# Patient Record
Sex: Female | Born: 1940
Health system: Southern US, Community
[De-identification: ages and names within clinical notes are randomized; demographics above are authoritative.]

## PROBLEM LIST (undated history)

## (undated) DIAGNOSIS — H269 Unspecified cataract: Secondary | ICD-10-CM

## (undated) DIAGNOSIS — E7800 Pure hypercholesterolemia, unspecified: Secondary | ICD-10-CM

## (undated) DIAGNOSIS — I10 Essential (primary) hypertension: Secondary | ICD-10-CM

## (undated) DIAGNOSIS — K635 Polyp of colon: Secondary | ICD-10-CM

## (undated) HISTORY — PX: OTHER SURGICAL HISTORY: SHX169

## (undated) HISTORY — PX: BILATERAL OOPHORECTOMY: SHX1221

## (undated) HISTORY — DX: Unspecified cataract: H26.9

## (undated) HISTORY — PX: COLONOSCOPY: SHX174

## (undated) HISTORY — PX: ABDOMINAL HYSTERECTOMY: SHX81

---

## 1978-08-29 HISTORY — PX: CHOLECYSTECTOMY: SHX55

## 2001-11-26 ENCOUNTER — Encounter: Payer: Self-pay | Admitting: Obstetrics and Gynecology

## 2001-11-26 ENCOUNTER — Ambulatory Visit (HOSPITAL_COMMUNITY): Admission: RE | Admit: 2001-11-26 | Discharge: 2001-11-26 | Payer: Self-pay | Admitting: Obstetrics and Gynecology

## 2002-12-25 ENCOUNTER — Encounter: Payer: Self-pay | Admitting: Obstetrics and Gynecology

## 2002-12-25 ENCOUNTER — Ambulatory Visit (HOSPITAL_COMMUNITY): Admission: RE | Admit: 2002-12-25 | Discharge: 2002-12-25 | Payer: Self-pay | Admitting: Obstetrics and Gynecology

## 2003-03-26 ENCOUNTER — Ambulatory Visit (HOSPITAL_COMMUNITY): Admission: RE | Admit: 2003-03-26 | Discharge: 2003-03-26 | Payer: Self-pay | Admitting: Family Medicine

## 2003-03-26 ENCOUNTER — Encounter: Payer: Self-pay | Admitting: Family Medicine

## 2003-05-15 ENCOUNTER — Ambulatory Visit (HOSPITAL_COMMUNITY): Admission: RE | Admit: 2003-05-15 | Discharge: 2003-05-15 | Payer: Self-pay | Admitting: Internal Medicine

## 2004-01-13 ENCOUNTER — Ambulatory Visit (HOSPITAL_COMMUNITY): Admission: RE | Admit: 2004-01-13 | Discharge: 2004-01-13 | Payer: Self-pay | Admitting: Obstetrics and Gynecology

## 2004-01-20 ENCOUNTER — Ambulatory Visit (HOSPITAL_COMMUNITY): Admission: RE | Admit: 2004-01-20 | Discharge: 2004-01-20 | Payer: Self-pay | Admitting: Family Medicine

## 2005-02-15 ENCOUNTER — Ambulatory Visit (HOSPITAL_COMMUNITY): Admission: RE | Admit: 2005-02-15 | Discharge: 2005-02-15 | Payer: Self-pay | Admitting: Obstetrics and Gynecology

## 2006-03-09 ENCOUNTER — Ambulatory Visit (HOSPITAL_COMMUNITY): Admission: RE | Admit: 2006-03-09 | Discharge: 2006-03-09 | Payer: Self-pay | Admitting: Obstetrics and Gynecology

## 2006-09-11 ENCOUNTER — Ambulatory Visit (HOSPITAL_COMMUNITY): Admission: RE | Admit: 2006-09-11 | Discharge: 2006-09-11 | Payer: Self-pay | Admitting: Family Medicine

## 2007-03-29 ENCOUNTER — Ambulatory Visit (HOSPITAL_COMMUNITY): Admission: RE | Admit: 2007-03-29 | Discharge: 2007-03-29 | Payer: Self-pay | Admitting: Obstetrics and Gynecology

## 2008-04-01 ENCOUNTER — Ambulatory Visit (HOSPITAL_COMMUNITY): Admission: RE | Admit: 2008-04-01 | Discharge: 2008-04-01 | Payer: Self-pay | Admitting: Obstetrics and Gynecology

## 2009-04-15 ENCOUNTER — Ambulatory Visit (HOSPITAL_COMMUNITY): Admission: RE | Admit: 2009-04-15 | Discharge: 2009-04-15 | Payer: Self-pay | Admitting: Obstetrics and Gynecology

## 2010-04-16 ENCOUNTER — Ambulatory Visit (HOSPITAL_COMMUNITY): Admission: RE | Admit: 2010-04-16 | Discharge: 2010-04-16 | Payer: Self-pay | Admitting: Obstetrics and Gynecology

## 2011-01-14 NOTE — Op Note (Signed)
NAMESONDRA, BLIXT                               ACCOUNT NO.:  [id number]   MEDICAL RECORD NO.:  [id number]                    PATIENT TYPE:   LOCATION:                                       FACILITY:   PHYSICIAN:  Lionel December, M.D.                 DATE OF BIRTH:   DATE OF PROCEDURE:  05/15/2003  DATE OF DISCHARGE:                                 OPERATIVE REPORT   PROCEDURE:  Esophagogastroduodenoscopy followed by total colonoscopy.   ENDOSCOPIST:  Lionel December, M.D.   INDICATIONS:  This patient is a 70 year old Caucasian female with recurrent  nausea.  She had upper GI series which revealed 2 filling defects in her  stomach, one in the proximal stomach which looked like a polyp.  The other  one was larger and could be an artifact.  She also had a small sliding  hiatal hernia.  She is, therefore, undergoing diagnostic EGD.  She will also  undergo colonoscopy for screening purposes.  The procedure and risks were  reviewed with the patient and informed consent was obtained.   PREOPERATIVE MEDICATIONS:  Cetacaine spray for oropharyngeal topical  anesthesia, Demerol 50 mg IV and Versed 6 mg IV.   FINDINGS:  Procedures performed in endoscopy suite.  The patient's vital  signs and O2 saturation were monitored during the procedure and remained  stable.   PROCEDURE #1: ESOPHAGOGASTRODUODENOSCOPY:  The patient was placed in the  left lateral recumbent position and Olympus videoscope was passed via the  oropharynx without any difficulty into the esophagus.   ESOPHAGUS:  Mucosa of the esophagus was normal throughout.  The  squamocolumnar junction was unremarkable and a hernia was not noted as it  had spontaneously reduced.   STOMACH:  It was empty and distended very well with insufflation.  The folds  of the proximal stomach were normal.  There was a 4 mm polyp at the gastric  body along the greater curvature which was ablated by a cold biopsy.  There  was antral mucosal granularity  and erythema, but there were no other polyps  noted.  The  pyloric channel was patent.  The angularis, fundus, and cardia  were examined by retroflexing the scope and were normal.   DUODENUM:  Examination of the bulb and second part of the duodenum was  normal.   Endoscope was withdrawn and patient was prepared for procedure #2.   TOTAL COLONOSCOPY:  Rectal examination was performed.  No abnormality noted  on external or digital exam.   The Olympus videoscope was placed in the rectum and advanced under vision  into the sigmoid colon and beyond.  There were a few small scattered  diverticula at the sigmoid colon.  Preparation was satisfactory.  The scope  was passed into the cecum which was identified by ileocecal valve and  appendiceal orifice or stump. Pictures were taken for the  record.  As the  scope was withdrawn, the colon mucosa was carefully examined.  There was a  single small flat polyp of the hepatic flexure which was ablated by a cold  biopsy.  The mucosa of the rest of the colon was normal.  Rectal mucosa  similarly was normal.   The scope was retroflexed to examine anorectal junction. She had small  hemorrhoids below the dentate line. The endoscope was straightened and  withdrawn.  The patient tolerated the procedure well.   FINAL DIAGNOSES:  1. A small gastric polyp at the body of the stomach which was ablated by a     cold biopsy.  2. Small external hemorrhoids.   RECOMMENDATIONS:  1. She will resume her usual medications.  H. pylori serology will be     checked today.  2. High fiber diet, Citrucel 1 tablespoonful daily.  I will be contacting     the patient with results of biopsy and blood test.                                               Lionel December, M.D.    NR/MEDQ  D:  05/15/2003  T:  05/15/2003  Job:  [id number]   cc:   Angus G. Renard Matter, M.D.  [address]  [place]  [place] [address]  Fax: [phone]

## 2011-01-14 NOTE — Consult Note (Signed)
Erin Khan, Erin Khan                             ACCOUNT NO.:  [id number]   MEDICAL RECORD NO.:  [id number]                   PATIENT TYPE:   LOCATION:                                       FACILITY:   PHYSICIAN:  Lionel December, M.D.                 DATE OF BIRTH:  1941-06-07   DATE OF CONSULTATION:  04/28/2003  DATE OF DISCHARGE:                                   CONSULTATION   REQUESTING PHYSICIAN:  Angus G. Renard Matter, M.D.   HISTORY OF PRESENT ILLNESS:  The patient is a 70 year old Caucasian female  who presents today for further evaluation of abnormal upper GI series. She  saw Dr. Renard Matter recently for persistent nausea of one week duration. She had  a upper GI series which revealed two apparent filling defects within the  stomach, one very small within the proximal stomach, questionable polyp, and  a second slightly larger filling defect which could be artifactual. EGD was  recommended. She also had a small hiatal hernia and gastroesophageal reflux.  The patient states that she was given Phenergan p.r.n. nausea which seemed  to help. Her symptoms lasted for approximately one week. She continues to  have intermittent bouts of nausea at this point. Denies any heartburn,  dysphagia, odynophagia, vomiting, constipation, diarrhea, melena, or rectal  bleeding. She does complain of chronic raw or sore feeling in the left mid  to lower abdomen. She has had a couple of abdominal surgeries and has always  felt this might be related to these prior surgeries. She has never had a  colonoscopy.   CURRENT MEDICATIONS:  1. Lotensin 15 mg daily.  2. Lipitor 20 mg daily.  3. Estrace 1 mg daily.  4. Calcium 600 +D daily.  5. Emetrol p.r.n.  6. Phenergan p.r.n.   ALLERGIES:  ASPIRIN.   PAST MEDICAL HISTORY:  1. Hypertension.  2. Hypercholesterolemia.   PAST SURGICAL HISTORY:  1. She had a partial hysterectomy followed by bilateral oophorectomy.  2. She has had a brain shunt for  hydrocephalus in 1998.  3. Cholecystectomy in 1978.  4. In 1999, she had some type of colon surgery; she states her colon     collapsed. She denies having any intestinal resection. Surgery was by Dr.     Lovell Sheehan. At that time, she presented with what sounds like obstructive     type process.   FAMILY HISTORY:  Father died of MI. No family history of colorectal cancer  or chronic GI illnesses.   SOCIAL HISTORY:  She has been married for 27 years, has two children. She is  employed with [employer]. She has never been smoker. Denies any  alcohol use.   REVIEW OF SYSTEMS:  Please HPI for GI. GENERAL:  Denies any weight loss.  CARDIOPULMONARY:  Denies any chest pain or shortness of breath.   PHYSICAL EXAMINATION:  VITAL SIGNS:  Weight  141, height 5 foot 6,  temperature 97.9, blood pressure 114/70, pulse 90.  GENERAL:  Pleasant, well-nourished, well-developed, Caucasian female in no  acute distress.  SKIN:  Warm and dry; no jaundice.  HEENT:  Conjunctive are pink. Sclerae are nonicteric. Oropharyngeal mucosa  moist and pink. No lesions, erythema, or exudate.  NECK:  No lymphadenopathy or thyromegaly.  CHEST:  Lungs clear to auscultation.  CARDIAC:  Reveals regular rate and rhythm. Normal S1 and  S2. No murmurs,  rubs, or gallops.  ABDOMEN:  Positive bowel sounds. Soft, nontender, nondistended. She has mild  left mid to left lower quadrant abdominal pain next to her midline incision.  No organomegaly or masses. No rebound, tenderness, or guarding.  EXTREMITIES:  No edema.   IMPRESSION:  1. History of protracted nausea which is now become more intermittent.     Recent upper GI series revealed two filling defects on x-ray. EKG has     been recommended to further evaluate these filling defects.  2. Chronic left lower quadrant abdominal pain. She really describes this as     a soreness more than anything. May be related to some adhesions although     cannot rule out intraluminal  abnormality. She has never had a colonoscopy     and recommend one at this point, primarily for screening purposes.   PLAN:  1. Colonoscopy and EGD in the near future.  2. Further recommendations to follow.     Tana Coast, P.A.                        Lionel December, M.D.    LL/MEDQ  D:  04/28/2003  T:  04/28/2003  Job:  [id number]   cc:   Angus G. Renard Matter, M.D.  [address]  [place]  [place] [address]  Fax: [phone]

## 2011-04-21 ENCOUNTER — Other Ambulatory Visit (HOSPITAL_COMMUNITY): Payer: Self-pay | Admitting: Obstetrics and Gynecology

## 2011-04-21 DIAGNOSIS — Z139 Encounter for screening, unspecified: Secondary | ICD-10-CM

## 2011-04-26 ENCOUNTER — Ambulatory Visit (HOSPITAL_COMMUNITY)
Admission: RE | Admit: 2011-04-26 | Discharge: 2011-04-26 | Disposition: A | Discharge status: Home / Self Care | Payer: Medicare Other | Source: Ambulatory Visit | Attending: Obstetrics and Gynecology | Admitting: Obstetrics and Gynecology

## 2011-04-26 DIAGNOSIS — Z1231 Encounter for screening mammogram for malignant neoplasm of breast: Secondary | ICD-10-CM | POA: Insufficient documentation

## 2011-04-26 DIAGNOSIS — Z139 Encounter for screening, unspecified: Secondary | ICD-10-CM

## 2012-05-01 ENCOUNTER — Other Ambulatory Visit (HOSPITAL_COMMUNITY): Payer: Self-pay | Admitting: Family Medicine

## 2012-05-01 DIAGNOSIS — Z139 Encounter for screening, unspecified: Secondary | ICD-10-CM

## 2012-05-08 ENCOUNTER — Ambulatory Visit (HOSPITAL_COMMUNITY): Payer: Medicare Other

## 2012-05-11 ENCOUNTER — Ambulatory Visit (HOSPITAL_COMMUNITY): Payer: Medicare Other

## 2012-05-22 ENCOUNTER — Ambulatory Visit (HOSPITAL_COMMUNITY)
Admission: RE | Admit: 2012-05-22 | Discharge: 2012-05-22 | Disposition: A | Discharge status: Home / Self Care | Payer: Medicare Other | Source: Ambulatory Visit | Attending: Family Medicine | Admitting: Family Medicine

## 2012-05-22 DIAGNOSIS — Z139 Encounter for screening, unspecified: Secondary | ICD-10-CM

## 2012-05-22 DIAGNOSIS — Z1231 Encounter for screening mammogram for malignant neoplasm of breast: Secondary | ICD-10-CM | POA: Insufficient documentation

## 2012-07-11 ENCOUNTER — Other Ambulatory Visit (HOSPITAL_COMMUNITY): Payer: Self-pay | Admitting: Family Medicine

## 2012-07-11 ENCOUNTER — Ambulatory Visit (HOSPITAL_COMMUNITY)
Admission: RE | Admit: 2012-07-11 | Discharge: 2012-07-11 | Disposition: A | Discharge status: Home / Self Care | Payer: Medicare Other | Source: Ambulatory Visit | Attending: Family Medicine | Admitting: Family Medicine

## 2012-07-11 DIAGNOSIS — T1490XA Injury, unspecified, initial encounter: Secondary | ICD-10-CM

## 2012-07-11 DIAGNOSIS — M79609 Pain in unspecified limb: Secondary | ICD-10-CM | POA: Insufficient documentation

## 2012-07-11 DIAGNOSIS — X58XXXA Exposure to other specified factors, initial encounter: Secondary | ICD-10-CM | POA: Insufficient documentation

## 2012-08-29 HISTORY — PX: CATARACT EXTRACTION, BILATERAL: SHX1313

## 2013-02-28 ENCOUNTER — Encounter (INDEPENDENT_AMBULATORY_CARE_PROVIDER_SITE_OTHER): Payer: Self-pay | Admitting: *Deleted

## 2013-03-28 ENCOUNTER — Encounter (INDEPENDENT_AMBULATORY_CARE_PROVIDER_SITE_OTHER): Payer: Self-pay | Admitting: *Deleted

## 2013-03-28 ENCOUNTER — Other Ambulatory Visit (INDEPENDENT_AMBULATORY_CARE_PROVIDER_SITE_OTHER): Payer: Self-pay | Admitting: *Deleted

## 2013-03-28 ENCOUNTER — Telehealth (INDEPENDENT_AMBULATORY_CARE_PROVIDER_SITE_OTHER): Payer: Self-pay | Admitting: *Deleted

## 2013-03-28 DIAGNOSIS — Z1211 Encounter for screening for malignant neoplasm of colon: Secondary | ICD-10-CM

## 2013-03-28 NOTE — Telephone Encounter (Signed)
Patient needs movi prep 

## 2013-03-29 MED ORDER — PEG-KCL-NACL-NASULF-NA ASC-C 100 G PO SOLR: 1.0000 | Freq: Once | ORAL | Status: DC

## 2013-04-02 ENCOUNTER — Encounter (INDEPENDENT_AMBULATORY_CARE_PROVIDER_SITE_OTHER): Payer: Self-pay | Admitting: *Deleted

## 2013-04-17 ENCOUNTER — Telehealth (INDEPENDENT_AMBULATORY_CARE_PROVIDER_SITE_OTHER): Payer: Self-pay | Admitting: *Deleted

## 2013-04-17 NOTE — Telephone Encounter (Signed)
  Procedure: tcs  Reason/Indication:  screening  Has patient had this procedure before?  Yes, over 10 yrs ago  If so, when, by whom and where?    Is there a family history of colon cancer?  no  Who?  What age when diagnosed?    Is patient diabetic?   no      Does patient have prosthetic heart valve?  no  Do you have a pacemaker?  no  Has patient ever had endocarditis? no  Has patient had joint replacement within last 12 months?  no  Is patient on Coumadin, Plavix and/or Aspirin? no  Medications: lotinsin 20 mg daily, crestor 20 mg daily, estrace 1 mg daily, calcium daily  Allergies: asa  Medication Adjustment:   Procedure date & time: 05/15/13 at 930

## 2013-04-18 ENCOUNTER — Other Ambulatory Visit (HOSPITAL_COMMUNITY): Payer: Self-pay | Admitting: Family Medicine

## 2013-04-18 DIAGNOSIS — Z139 Encounter for screening, unspecified: Secondary | ICD-10-CM

## 2013-04-18 NOTE — Telephone Encounter (Signed)
agree

## 2013-04-30 ENCOUNTER — Encounter (HOSPITAL_COMMUNITY): Payer: Self-pay | Admitting: Pharmacy Technician

## 2013-05-15 ENCOUNTER — Ambulatory Visit (HOSPITAL_COMMUNITY)
Admission: RE | Admit: 2013-05-15 | Discharge: 2013-05-15 | Disposition: A | Discharge status: Home / Self Care | Payer: Medicare Other | Source: Ambulatory Visit | Attending: Internal Medicine | Admitting: Internal Medicine

## 2013-05-15 ENCOUNTER — Encounter (HOSPITAL_COMMUNITY): Payer: Self-pay | Admitting: *Deleted

## 2013-05-15 ENCOUNTER — Encounter (HOSPITAL_COMMUNITY)
Admission: RE | Disposition: A | Discharge status: Home / Self Care | Payer: Self-pay | Source: Ambulatory Visit | Attending: Internal Medicine

## 2013-05-15 DIAGNOSIS — Z1211 Encounter for screening for malignant neoplasm of colon: Secondary | ICD-10-CM | POA: Insufficient documentation

## 2013-05-15 DIAGNOSIS — K5730 Diverticulosis of large intestine without perforation or abscess without bleeding: Secondary | ICD-10-CM | POA: Insufficient documentation

## 2013-05-15 DIAGNOSIS — D126 Benign neoplasm of colon, unspecified: Secondary | ICD-10-CM

## 2013-05-15 DIAGNOSIS — K644 Residual hemorrhoidal skin tags: Secondary | ICD-10-CM | POA: Insufficient documentation

## 2013-05-15 DIAGNOSIS — I10 Essential (primary) hypertension: Secondary | ICD-10-CM | POA: Insufficient documentation

## 2013-05-15 HISTORY — DX: Pure hypercholesterolemia, unspecified: E78.00

## 2013-05-15 HISTORY — PX: COLONOSCOPY: SHX5424

## 2013-05-15 HISTORY — DX: Polyp of colon: K63.5

## 2013-05-15 HISTORY — DX: Essential (primary) hypertension: I10

## 2013-05-15 SURGERY — COLONOSCOPY
Anesthesia: Moderate Sedation

## 2013-05-15 MED ORDER — MEPERIDINE HCL 50 MG/ML IJ SOLN
INTRAMUSCULAR | Status: AC
  Filled 2013-05-15: qty 1

## 2013-05-15 MED ORDER — STERILE WATER FOR IRRIGATION IR SOLN
Status: DC | PRN
  Administered 2013-05-15: 10:00:00

## 2013-05-15 MED ORDER — MEPERIDINE HCL 50 MG/ML IJ SOLN
INTRAMUSCULAR | Status: DC | PRN
  Administered 2013-05-15 (×2): 25 mg via INTRAVENOUS

## 2013-05-15 MED ORDER — SODIUM CHLORIDE 0.9 % IV SOLN
INTRAVENOUS | Status: DC
  Administered 2013-05-15: 09:00:00 via INTRAVENOUS

## 2013-05-15 MED ORDER — MIDAZOLAM HCL 5 MG/5ML IJ SOLN
INTRAMUSCULAR | Status: AC
  Filled 2013-05-15: qty 10

## 2013-05-15 MED ORDER — MIDAZOLAM HCL 5 MG/5ML IJ SOLN
INTRAMUSCULAR | Status: DC | PRN
  Administered 2013-05-15: 2 mg via INTRAVENOUS
  Administered 2013-05-15 (×3): 1 mg via INTRAVENOUS
  Administered 2013-05-15: 2 mg via INTRAVENOUS

## 2013-05-15 NOTE — Op Note (Signed)
COLONOSCOPY PROCEDURE REPORT  PATIENT:  Erin Khan  MR#:  [id number] Birthdate:  1940/09/03, 72 y.o., female Endoscopist:  Dr. Malissa Hippo, MD Referred By:  Dr. Perlie Mayo. Renard Matter, MD  Procedure Date: 05/15/2013  Procedure:   Colonoscopy  Indications: Patient is 72 year old Caucasian female is undergoing average risk screening colonoscopy.  Informed Consent:  The procedure and risks were reviewed with the patient and informed consent was obtained.  Medications:  Demerol 50 mg IV Versed 7 mg IV  Description of procedure:  After a digital rectal exam was performed, that colonoscope was advanced from the anus through the rectum and colon to the area of the cecum, ileocecal valve and appendiceal orifice. The cecum was deeply intubated. These structures were well-seen and photographed for the record. From the level of the cecum and ileocecal valve, the scope was slowly and cautiously withdrawn. The mucosal surfaces were carefully surveyed utilizing scope tip to flexion to facilitate fold flattening as needed. The scope was pulled down into the rectum where a thorough exam including retroflexion was performed.  Findings:   Prep satisfactory. Two small polyps ablated via cold biopsy and submitted together. These are located at cecum and splenic flexure. Moderate number of diverticula at sigmoid colon. Normal rectal mucosa. Small hemorrhoids below the dentate line along with anal papillae.   Therapeutic/Diagnostic Maneuvers Performed:  See above  Complications:  None  Cecal Withdrawal Time:  18 minutes  Impression:  Examination performed to cecum. Two small polyps ablated via cold biopsy and submitted together(cecum and splenic flexure). Moderate sigmoid colon diverticulosis. Small external hemorrhoids and anal papillae.  Recommendations:  Standard instructions given. I will contact patient with biopsy results and further recommendations.  REHMAN,NAJEEB U  05/15/2013 10:25  AM  CC: Dr. Alice Reichert, MD & Dr. Bonnetta Barry ref. provider found

## 2013-05-15 NOTE — H&P (Signed)
Erin Khan is an 72 y.o. female.   Chief Complaint: Patient's here for colonoscopy. HPI: Patient is 72 year old Caucasian female who presents for  screening colonoscopy. Patient's last exam was in 2004. She denies abdominal pain rectal bleeding or change in bowel habits. Family history is negative for colorectal carcinoma.  Past Medical History  Diagnosis Date  . Hypertension   . Hypercholesteremia   . Colon polyps     Past Surgical History  Procedure Laterality Date  . Abdominal hysterectomy    . Cranial shunt      17 years ago  . Colonoscopy      Family History  Problem Relation Age of Onset  . Lung cancer Sister   . Colon cancer Neg Hx    Social History:  reports that she has never smoked. She does not have any smokeless tobacco history on file. She reports that she does not drink alcohol or use illicit drugs.  Allergies:  Allergies  Allergen Reactions  . Asa [Aspirin] Hives and Swelling    Medications Prior to Admission  Medication Sig Dispense Refill  . benazepril (LOTENSIN) 20 MG tablet Take 20 mg by mouth daily.      . calcium carbonate (OS-CAL) 600 MG TABS tablet Take 600 mg by mouth daily.      Marland Kitchen estradiol (ESTRACE) 1 MG tablet Take 1 mg by mouth daily.      . rosuvastatin (CRESTOR) 20 MG tablet Take 20 mg by mouth daily.        No results found for this or any previous visit (from the past 48 hour(s)). No results found.  ROS  Blood pressure 171/88, pulse 80, temperature 97.9 F (36.6 C), temperature source Oral, resp. rate 18, height 5\' 6"  (1.676 m), weight 144 lb (65.318 kg), SpO2 94.00%. Physical Exam  Constitutional: She appears well-developed and well-nourished.  HENT:  Mouth/Throat: Oropharynx is clear and moist.  Eyes: Conjunctivae are normal. No scleral icterus.  Neck: No thyromegaly present.  Cardiovascular: Normal rate, regular rhythm and normal heart sounds.   No murmur heard. Respiratory: Effort normal and breath sounds normal.  GI: Soft.  She exhibits no distension and no mass. There is no tenderness.  Musculoskeletal: She exhibits no edema.  Lymphadenopathy:    She has no cervical adenopathy.  Neurological: She is alert.  Skin: Skin is warm and dry.     Assessment/Plan Average risk screening colonoscopy.  REHMAN,NAJEEB U 05/15/2013, 9:41 AM

## 2013-05-17 ENCOUNTER — Encounter (HOSPITAL_COMMUNITY): Payer: Self-pay | Admitting: Internal Medicine

## 2013-05-24 ENCOUNTER — Ambulatory Visit (HOSPITAL_COMMUNITY)
Admission: RE | Admit: 2013-05-24 | Discharge: 2013-05-24 | Disposition: A | Discharge status: Home / Self Care | Payer: Medicare Other | Source: Ambulatory Visit | Attending: Family Medicine | Admitting: Family Medicine

## 2013-05-24 DIAGNOSIS — Z139 Encounter for screening, unspecified: Secondary | ICD-10-CM

## 2013-05-24 DIAGNOSIS — Z1231 Encounter for screening mammogram for malignant neoplasm of breast: Secondary | ICD-10-CM | POA: Insufficient documentation

## 2013-05-28 ENCOUNTER — Encounter (INDEPENDENT_AMBULATORY_CARE_PROVIDER_SITE_OTHER): Payer: Self-pay | Admitting: *Deleted

## 2014-04-21 ENCOUNTER — Other Ambulatory Visit (HOSPITAL_COMMUNITY): Payer: Self-pay | Admitting: Family Medicine

## 2014-04-21 DIAGNOSIS — Z1231 Encounter for screening mammogram for malignant neoplasm of breast: Secondary | ICD-10-CM

## 2014-05-26 ENCOUNTER — Ambulatory Visit (HOSPITAL_COMMUNITY)
Admission: RE | Admit: 2014-05-26 | Discharge: 2014-05-26 | Disposition: A | Discharge status: Home / Self Care | Payer: Medicare Other | Source: Ambulatory Visit | Attending: Family Medicine | Admitting: Family Medicine

## 2014-05-26 DIAGNOSIS — Z1231 Encounter for screening mammogram for malignant neoplasm of breast: Secondary | ICD-10-CM | POA: Diagnosis present

## 2015-05-05 ENCOUNTER — Other Ambulatory Visit (HOSPITAL_COMMUNITY): Payer: Self-pay | Admitting: Obstetrics and Gynecology

## 2015-05-05 DIAGNOSIS — Z1231 Encounter for screening mammogram for malignant neoplasm of breast: Secondary | ICD-10-CM

## 2015-05-29 ENCOUNTER — Ambulatory Visit (HOSPITAL_COMMUNITY)
Admission: RE | Admit: 2015-05-29 | Discharge: 2015-05-29 | Disposition: A | Discharge status: Home / Self Care | Payer: Medicare Other | Source: Ambulatory Visit | Attending: Obstetrics and Gynecology | Admitting: Obstetrics and Gynecology

## 2015-05-29 DIAGNOSIS — Z1231 Encounter for screening mammogram for malignant neoplasm of breast: Secondary | ICD-10-CM | POA: Diagnosis not present

## 2015-09-02 DIAGNOSIS — M9901 Segmental and somatic dysfunction of cervical region: Secondary | ICD-10-CM | POA: Diagnosis not present

## 2015-09-02 DIAGNOSIS — M9903 Segmental and somatic dysfunction of lumbar region: Secondary | ICD-10-CM | POA: Diagnosis not present

## 2015-09-02 DIAGNOSIS — M5137 Other intervertebral disc degeneration, lumbosacral region: Secondary | ICD-10-CM | POA: Diagnosis not present

## 2015-09-02 DIAGNOSIS — M9902 Segmental and somatic dysfunction of thoracic region: Secondary | ICD-10-CM | POA: Diagnosis not present

## 2015-09-03 DIAGNOSIS — M5137 Other intervertebral disc degeneration, lumbosacral region: Secondary | ICD-10-CM | POA: Diagnosis not present

## 2015-09-03 DIAGNOSIS — M9902 Segmental and somatic dysfunction of thoracic region: Secondary | ICD-10-CM | POA: Diagnosis not present

## 2015-09-03 DIAGNOSIS — M9903 Segmental and somatic dysfunction of lumbar region: Secondary | ICD-10-CM | POA: Diagnosis not present

## 2015-09-03 DIAGNOSIS — M9901 Segmental and somatic dysfunction of cervical region: Secondary | ICD-10-CM | POA: Diagnosis not present

## 2015-09-08 DIAGNOSIS — N816 Rectocele: Secondary | ICD-10-CM | POA: Diagnosis not present

## 2015-09-08 DIAGNOSIS — I10 Essential (primary) hypertension: Secondary | ICD-10-CM | POA: Diagnosis not present

## 2015-09-09 DIAGNOSIS — M9901 Segmental and somatic dysfunction of cervical region: Secondary | ICD-10-CM | POA: Diagnosis not present

## 2015-09-09 DIAGNOSIS — M5137 Other intervertebral disc degeneration, lumbosacral region: Secondary | ICD-10-CM | POA: Diagnosis not present

## 2015-09-09 DIAGNOSIS — M9903 Segmental and somatic dysfunction of lumbar region: Secondary | ICD-10-CM | POA: Diagnosis not present

## 2015-09-09 DIAGNOSIS — M9902 Segmental and somatic dysfunction of thoracic region: Secondary | ICD-10-CM | POA: Diagnosis not present

## 2015-09-14 DIAGNOSIS — M9903 Segmental and somatic dysfunction of lumbar region: Secondary | ICD-10-CM | POA: Diagnosis not present

## 2015-09-14 DIAGNOSIS — M9902 Segmental and somatic dysfunction of thoracic region: Secondary | ICD-10-CM | POA: Diagnosis not present

## 2015-09-14 DIAGNOSIS — M9901 Segmental and somatic dysfunction of cervical region: Secondary | ICD-10-CM | POA: Diagnosis not present

## 2015-09-14 DIAGNOSIS — M5137 Other intervertebral disc degeneration, lumbosacral region: Secondary | ICD-10-CM | POA: Diagnosis not present

## 2015-09-28 DIAGNOSIS — M9903 Segmental and somatic dysfunction of lumbar region: Secondary | ICD-10-CM | POA: Diagnosis not present

## 2015-09-28 DIAGNOSIS — M9902 Segmental and somatic dysfunction of thoracic region: Secondary | ICD-10-CM | POA: Diagnosis not present

## 2015-09-28 DIAGNOSIS — M9901 Segmental and somatic dysfunction of cervical region: Secondary | ICD-10-CM | POA: Diagnosis not present

## 2015-09-28 DIAGNOSIS — M5137 Other intervertebral disc degeneration, lumbosacral region: Secondary | ICD-10-CM | POA: Diagnosis not present

## 2015-10-14 DIAGNOSIS — N819 Female genital prolapse, unspecified: Secondary | ICD-10-CM | POA: Diagnosis not present

## 2015-10-14 DIAGNOSIS — Z960 Presence of urogenital implants: Secondary | ICD-10-CM | POA: Insufficient documentation

## 2015-10-14 DIAGNOSIS — N8189 Other female genital prolapse: Secondary | ICD-10-CM | POA: Diagnosis not present

## 2015-10-19 DIAGNOSIS — M9902 Segmental and somatic dysfunction of thoracic region: Secondary | ICD-10-CM | POA: Diagnosis not present

## 2015-10-19 DIAGNOSIS — M9903 Segmental and somatic dysfunction of lumbar region: Secondary | ICD-10-CM | POA: Diagnosis not present

## 2015-10-19 DIAGNOSIS — M5137 Other intervertebral disc degeneration, lumbosacral region: Secondary | ICD-10-CM | POA: Diagnosis not present

## 2015-10-19 DIAGNOSIS — M9901 Segmental and somatic dysfunction of cervical region: Secondary | ICD-10-CM | POA: Diagnosis not present

## 2015-10-27 DIAGNOSIS — N8189 Other female genital prolapse: Secondary | ICD-10-CM | POA: Diagnosis not present

## 2015-10-27 DIAGNOSIS — Z960 Presence of urogenital implants: Secondary | ICD-10-CM | POA: Diagnosis not present

## 2015-11-04 DIAGNOSIS — Z0000 Encounter for general adult medical examination without abnormal findings: Secondary | ICD-10-CM | POA: Diagnosis not present

## 2015-11-04 DIAGNOSIS — E782 Mixed hyperlipidemia: Secondary | ICD-10-CM | POA: Diagnosis not present

## 2015-11-14 DIAGNOSIS — I10 Essential (primary) hypertension: Secondary | ICD-10-CM | POA: Diagnosis not present

## 2015-11-14 DIAGNOSIS — E782 Mixed hyperlipidemia: Secondary | ICD-10-CM | POA: Diagnosis not present

## 2015-11-16 DIAGNOSIS — M9903 Segmental and somatic dysfunction of lumbar region: Secondary | ICD-10-CM | POA: Diagnosis not present

## 2015-11-16 DIAGNOSIS — M5137 Other intervertebral disc degeneration, lumbosacral region: Secondary | ICD-10-CM | POA: Diagnosis not present

## 2015-11-16 DIAGNOSIS — M9902 Segmental and somatic dysfunction of thoracic region: Secondary | ICD-10-CM | POA: Diagnosis not present

## 2015-11-16 DIAGNOSIS — M9901 Segmental and somatic dysfunction of cervical region: Secondary | ICD-10-CM | POA: Diagnosis not present

## 2015-12-02 DIAGNOSIS — I739 Peripheral vascular disease, unspecified: Secondary | ICD-10-CM | POA: Diagnosis not present

## 2015-12-02 DIAGNOSIS — I10 Essential (primary) hypertension: Secondary | ICD-10-CM | POA: Diagnosis not present

## 2015-12-09 DIAGNOSIS — I10 Essential (primary) hypertension: Secondary | ICD-10-CM | POA: Diagnosis not present

## 2015-12-09 DIAGNOSIS — E782 Mixed hyperlipidemia: Secondary | ICD-10-CM | POA: Diagnosis not present

## 2015-12-14 DIAGNOSIS — M9901 Segmental and somatic dysfunction of cervical region: Secondary | ICD-10-CM | POA: Diagnosis not present

## 2015-12-14 DIAGNOSIS — M9903 Segmental and somatic dysfunction of lumbar region: Secondary | ICD-10-CM | POA: Diagnosis not present

## 2015-12-14 DIAGNOSIS — M5137 Other intervertebral disc degeneration, lumbosacral region: Secondary | ICD-10-CM | POA: Diagnosis not present

## 2015-12-14 DIAGNOSIS — M9902 Segmental and somatic dysfunction of thoracic region: Secondary | ICD-10-CM | POA: Diagnosis not present

## 2016-01-11 DIAGNOSIS — M9902 Segmental and somatic dysfunction of thoracic region: Secondary | ICD-10-CM | POA: Diagnosis not present

## 2016-01-11 DIAGNOSIS — M5137 Other intervertebral disc degeneration, lumbosacral region: Secondary | ICD-10-CM | POA: Diagnosis not present

## 2016-01-11 DIAGNOSIS — M9901 Segmental and somatic dysfunction of cervical region: Secondary | ICD-10-CM | POA: Diagnosis not present

## 2016-01-11 DIAGNOSIS — M9903 Segmental and somatic dysfunction of lumbar region: Secondary | ICD-10-CM | POA: Diagnosis not present

## 2016-01-26 DIAGNOSIS — N8189 Other female genital prolapse: Secondary | ICD-10-CM | POA: Diagnosis not present

## 2016-01-26 DIAGNOSIS — Z960 Presence of urogenital implants: Secondary | ICD-10-CM | POA: Diagnosis not present

## 2016-02-22 DIAGNOSIS — M9901 Segmental and somatic dysfunction of cervical region: Secondary | ICD-10-CM | POA: Diagnosis not present

## 2016-02-22 DIAGNOSIS — M5137 Other intervertebral disc degeneration, lumbosacral region: Secondary | ICD-10-CM | POA: Diagnosis not present

## 2016-02-22 DIAGNOSIS — M9903 Segmental and somatic dysfunction of lumbar region: Secondary | ICD-10-CM | POA: Diagnosis not present

## 2016-02-22 DIAGNOSIS — M9902 Segmental and somatic dysfunction of thoracic region: Secondary | ICD-10-CM | POA: Diagnosis not present

## 2016-03-21 ENCOUNTER — Ambulatory Visit (INDEPENDENT_AMBULATORY_CARE_PROVIDER_SITE_OTHER): Payer: Medicare Other | Admitting: Family Medicine

## 2016-03-21 ENCOUNTER — Encounter: Payer: Self-pay | Admitting: Family Medicine

## 2016-03-21 DIAGNOSIS — I10 Essential (primary) hypertension: Secondary | ICD-10-CM | POA: Insufficient documentation

## 2016-03-21 DIAGNOSIS — E785 Hyperlipidemia, unspecified: Secondary | ICD-10-CM | POA: Insufficient documentation

## 2016-03-21 MED ORDER — LISINOPRIL-HYDROCHLOROTHIAZIDE 20-25 MG PO TABS: 1.0000 | ORAL_TABLET | Freq: Every day | ORAL | 3 refills | Status: DC

## 2016-03-21 NOTE — Patient Instructions (Signed)
Great to meet you!  Lets have you come back for fasting labs within 1 week  Come back in 4-6 weeks for blood pressure check and re-checking your labs.   We will call with your results within 1 week of your tests.   It is ok to stop your current blood pressure medicine when you run out, then start lisinopril/HCTZ. This is a combination of 2 medications.

## 2016-03-21 NOTE — Progress Notes (Signed)
   HPI  Patient presents today to establish care.  Patient has straight hypertension and was changed to a different medication when starting with her old physician.  She states the medication is too expensive and would like to change.  Hypertension Good medication compliance No chest pain, dyspnea, palpitations, leg edema. He is recreationally active but no formal exercise.  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   PMH: Smoking status noted Past medical history positive for hypercholesterolemia, hypertension, colon polyps Family history of lung cancer her sister. She's had colonoscopy 2, the last one being in 2014. She's had an abdominal hysterectomy. She is not a smoker ROS: Per HPI  Objective: BP 124/68   Pulse 79   Temp 98.1 F (36.7 C) (Oral)   Ht 5\' 6"  (1.676 m)   Wt 150 lb 9.6 oz (68.3 kg)   BMI 24.31 kg/m  Gen: NAD, alert, cooperative with exam HEENT: NCAT CV: RRR, good S1/S2, no murmur Resp: CTABL, no wheezes, non-labored Abd: SNTND, BS present, no guarding or organomegaly Ext: No edema, warm Neuro: Alert and oriented,  strength 5/5 and sensation intact in all 4 extremities  Assessment and plan:  # Hypertension At goal She is on Edarbyclor, which is a max dose ARB + chlorthalidone Change to prinzide 20/25, 1/2 dose Ace so I increased the HCTZ portion.  Labs, recheck in 4-6 weeks  Hyperlipidemia Rechecking labs   Meds ordered this encounter  Medications  . lisinopril-hydrochlorothiazide (PRINZIDE,ZESTORETIC) 20-25 MG tablet    Sig: Take 1 tablet by mouth daily.    Dispense:  90 tablet    Refill:  [place], MD [facility] 03/21/2016, 1:41 PM

## 2016-03-25 ENCOUNTER — Other Ambulatory Visit: Payer: Medicare Other

## 2016-03-25 DIAGNOSIS — I10 Essential (primary) hypertension: Secondary | ICD-10-CM | POA: Diagnosis not present

## 2016-03-26 LAB — CBC WITH DIFFERENTIAL/PLATELET
Basophils Absolute: 0.1 10*3/uL (ref 0.0–0.2)
Basos: 1 %
EOS (ABSOLUTE): 0.2 10*3/uL (ref 0.0–0.4)
Eos: 3 %
Hematocrit: 37.6 % (ref 34.0–46.6)
Hemoglobin: 12.9 g/dL (ref 11.1–15.9)
Immature Grans (Abs): 0 10*3/uL (ref 0.0–0.1)
Immature Granulocytes: 0 %
Lymphocytes Absolute: 1.4 10*3/uL (ref 0.7–3.1)
Lymphs: 24 %
MCH: 31 pg (ref 26.6–33.0)
MCHC: 34.3 g/dL (ref 31.5–35.7)
MCV: 90 fL (ref 79–97)
Monocytes Absolute: 0.9 10*3/uL (ref 0.1–0.9)
Monocytes: 14 %
Neutrophils Absolute: 3.5 10*3/uL (ref 1.4–7.0)
Neutrophils: 58 %
Platelets: 207 10*3/uL (ref 150–379)
RBC: 4.16 x10E6/uL (ref 3.77–5.28)
RDW: 13.6 % (ref 12.3–15.4)
WBC: 6 10*3/uL (ref 3.4–10.8)

## 2016-03-26 LAB — CMP14+EGFR
ALT: 12 IU/L (ref 0–32)
AST: 11 IU/L (ref 0–40)
Albumin/Globulin Ratio: 1.5 (ref 1.2–2.2)
Albumin: 4.2 g/dL (ref 3.5–4.8)
Alkaline Phosphatase: 90 IU/L (ref 39–117)
BUN/Creatinine Ratio: 20 (ref 12–28)
BUN: 29 mg/dL — ABNORMAL HIGH (ref 8–27)
Bilirubin Total: 0.4 mg/dL (ref 0.0–1.2)
CO2: 25 mmol/L (ref 18–29)
Calcium: 9.6 mg/dL (ref 8.7–10.3)
Chloride: 100 mmol/L (ref 96–106)
Creatinine, Ser: 1.43 mg/dL — ABNORMAL HIGH (ref 0.57–1.00)
GFR calc Af Amer: 42 mL/min/{1.73_m2} — ABNORMAL LOW (ref >59)
GFR calc non Af Amer: 36 mL/min/{1.73_m2} — ABNORMAL LOW (ref >59)
Globulin, Total: 2.8 g/dL (ref 1.5–4.5)
Glucose: 90 mg/dL (ref 65–99)
Potassium: 4 mmol/L (ref 3.5–5.2)
Sodium: 141 mmol/L (ref 134–144)
Total Protein: 7 g/dL (ref 6.0–8.5)

## 2016-03-26 LAB — LIPID PANEL
Chol/HDL Ratio: 5.8 ratio units — ABNORMAL HIGH (ref 0.0–4.4)
Cholesterol, Total: 228 mg/dL — ABNORMAL HIGH (ref 100–199)
HDL: 39 mg/dL — ABNORMAL LOW (ref >39)
Triglycerides: 403 mg/dL — ABNORMAL HIGH (ref 0–149)

## 2016-03-29 DIAGNOSIS — N819 Female genital prolapse, unspecified: Secondary | ICD-10-CM | POA: Diagnosis not present

## 2016-03-29 DIAGNOSIS — N8189 Other female genital prolapse: Secondary | ICD-10-CM | POA: Diagnosis not present

## 2016-03-29 DIAGNOSIS — Z960 Presence of urogenital implants: Secondary | ICD-10-CM | POA: Diagnosis not present

## 2016-04-04 DIAGNOSIS — M5137 Other intervertebral disc degeneration, lumbosacral region: Secondary | ICD-10-CM | POA: Diagnosis not present

## 2016-04-04 DIAGNOSIS — M9901 Segmental and somatic dysfunction of cervical region: Secondary | ICD-10-CM | POA: Diagnosis not present

## 2016-04-04 DIAGNOSIS — M9903 Segmental and somatic dysfunction of lumbar region: Secondary | ICD-10-CM | POA: Diagnosis not present

## 2016-04-04 DIAGNOSIS — M9902 Segmental and somatic dysfunction of thoracic region: Secondary | ICD-10-CM | POA: Diagnosis not present

## 2016-04-18 ENCOUNTER — Ambulatory Visit (INDEPENDENT_AMBULATORY_CARE_PROVIDER_SITE_OTHER): Payer: Medicare Other | Admitting: Family Medicine

## 2016-04-18 ENCOUNTER — Other Ambulatory Visit (HOSPITAL_COMMUNITY): Payer: Self-pay | Admitting: Obstetrics and Gynecology

## 2016-04-18 ENCOUNTER — Encounter: Payer: Self-pay | Admitting: Family Medicine

## 2016-04-18 VITALS — BP 140/78 | HR 91 | Temp 98.0°F | Ht 66.0 in | Wt 151.6 lb

## 2016-04-18 DIAGNOSIS — I10 Essential (primary) hypertension: Secondary | ICD-10-CM

## 2016-04-18 DIAGNOSIS — E785 Hyperlipidemia, unspecified: Secondary | ICD-10-CM | POA: Diagnosis not present

## 2016-04-18 DIAGNOSIS — E781 Pure hyperglyceridemia: Secondary | ICD-10-CM | POA: Diagnosis not present

## 2016-04-18 DIAGNOSIS — Z1231 Encounter for screening mammogram for malignant neoplasm of breast: Secondary | ICD-10-CM

## 2016-04-18 NOTE — Progress Notes (Signed)
   HPI  Patient presents today here for HTN f/u  She is tolerating med well, changed from ARB/chlorthalidone combo to prinzide.   He is tolerating it very well. She denies any shortness of breath, chest pain, leg swelling, or palpitations. She states her blood pressure at home is been ranging [id number] to 0000000 systolic. She's not watching her diet lately, she states that previously in life she was able to lower her cholesterol with diet very effectively. She states that she did not tolerate Lipitor very well. To get eating right again. She walks 15-20 minutes a day, 4-5 days a week.   PMH: Smoking status noted ROS: Per HPI  Objective: BP 140/78   Pulse 91   Temp 98 F (36.7 C) (Oral)   Ht 5\' 6"  (1.676 m)   Wt 151 lb 9.6 oz (68.8 kg)   BMI 24.47 kg/m  Gen: NAD, alert, cooperative with exam HEENT: NCAT CV: RRR, good S1/S2, no murmur Resp: CTABL, no wheezes, non-labored Abd: SNTND, BS present, no guarding or organomegaly Ext: No edema, warm Neuro: Alert and oriented, No gross deficits  Assessment and plan:  # Hypertension Well controlled Continue Prinzide Repeat labs  # Hyperlipidemia Discussed statins, she would like to defer. She is going to try therapeutic lifestyle changes Repeat LDL direct  # Hypertriglyceridemia Discussed that this is mostly diet and exercise dependent She will make lifestyle changes Repeat labs in 3 months    Orders Placed This Encounter  Procedures  . Basic metabolic panel    Order Specific Question:   Has the patient fasted?    Answer:   No  . LDL Cholesterol, Direct    Laroy Apple, MD [facility] 04/18/2016, 2:11 PM

## 2016-04-18 NOTE — Patient Instructions (Signed)
Great to see you!  We will call with you rlabs  Come back in 3 months to check your triglycerides and cholesterol again.   Try to cut back on fried and fatty foods and try to get 20-30 minutes of aerobic exercise 4-5 days a week.

## 2016-04-19 LAB — BASIC METABOLIC PANEL
BUN/Creatinine Ratio: 22 (ref 12–28)
BUN: 31 mg/dL — ABNORMAL HIGH (ref 8–27)
CO2: 20 mmol/L (ref 18–29)
Calcium: 9.9 mg/dL (ref 8.7–10.3)
Chloride: 100 mmol/L (ref 96–106)
Creatinine, Ser: 1.4 mg/dL — ABNORMAL HIGH (ref 0.57–1.00)
GFR calc Af Amer: 43 mL/min/{1.73_m2} — ABNORMAL LOW (ref >59)
GFR calc non Af Amer: 37 mL/min/{1.73_m2} — ABNORMAL LOW (ref >59)
Glucose: 94 mg/dL (ref 65–99)
Potassium: 4 mmol/L (ref 3.5–5.2)
Sodium: 140 mmol/L (ref 134–144)

## 2016-04-19 LAB — LDL CHOLESTEROL, DIRECT: LDL Direct: 111 mg/dL — ABNORMAL HIGH (ref 0–99)

## 2016-05-09 DIAGNOSIS — M9901 Segmental and somatic dysfunction of cervical region: Secondary | ICD-10-CM | POA: Diagnosis not present

## 2016-05-09 DIAGNOSIS — M9903 Segmental and somatic dysfunction of lumbar region: Secondary | ICD-10-CM | POA: Diagnosis not present

## 2016-05-09 DIAGNOSIS — M9902 Segmental and somatic dysfunction of thoracic region: Secondary | ICD-10-CM | POA: Diagnosis not present

## 2016-05-09 DIAGNOSIS — M5137 Other intervertebral disc degeneration, lumbosacral region: Secondary | ICD-10-CM | POA: Diagnosis not present

## 2016-05-28 ENCOUNTER — Ambulatory Visit (INDEPENDENT_AMBULATORY_CARE_PROVIDER_SITE_OTHER): Payer: Medicare Other

## 2016-05-28 DIAGNOSIS — Z23 Encounter for immunization: Secondary | ICD-10-CM | POA: Diagnosis not present

## 2016-05-30 ENCOUNTER — Ambulatory Visit (HOSPITAL_COMMUNITY)
Admission: RE | Admit: 2016-05-30 | Discharge: 2016-05-30 | Disposition: A | Discharge status: Home / Self Care | Payer: Medicare Other | Source: Ambulatory Visit | Attending: Obstetrics and Gynecology | Admitting: Obstetrics and Gynecology

## 2016-05-30 DIAGNOSIS — Z1231 Encounter for screening mammogram for malignant neoplasm of breast: Secondary | ICD-10-CM | POA: Insufficient documentation

## 2016-06-20 DIAGNOSIS — M9901 Segmental and somatic dysfunction of cervical region: Secondary | ICD-10-CM | POA: Diagnosis not present

## 2016-06-20 DIAGNOSIS — M5137 Other intervertebral disc degeneration, lumbosacral region: Secondary | ICD-10-CM | POA: Diagnosis not present

## 2016-06-20 DIAGNOSIS — M9903 Segmental and somatic dysfunction of lumbar region: Secondary | ICD-10-CM | POA: Diagnosis not present

## 2016-06-20 DIAGNOSIS — M9902 Segmental and somatic dysfunction of thoracic region: Secondary | ICD-10-CM | POA: Diagnosis not present

## 2016-07-25 ENCOUNTER — Ambulatory Visit (INDEPENDENT_AMBULATORY_CARE_PROVIDER_SITE_OTHER): Payer: Medicare Other | Admitting: Family Medicine

## 2016-07-25 ENCOUNTER — Encounter: Payer: Self-pay | Admitting: Family Medicine

## 2016-07-25 VITALS — BP 140/77 | HR 73 | Temp 97.1°F | Ht 66.0 in | Wt 152.6 lb

## 2016-07-25 DIAGNOSIS — E781 Pure hyperglyceridemia: Secondary | ICD-10-CM

## 2016-07-25 DIAGNOSIS — Z23 Encounter for immunization: Secondary | ICD-10-CM

## 2016-07-25 DIAGNOSIS — E785 Hyperlipidemia, unspecified: Secondary | ICD-10-CM | POA: Diagnosis not present

## 2016-07-25 DIAGNOSIS — I10 Essential (primary) hypertension: Secondary | ICD-10-CM | POA: Diagnosis not present

## 2016-07-25 NOTE — Patient Instructions (Signed)
Great to see you!  Get Dr. Marvel Plan to send a copy of your dexa scan results when they ae done.   Lets plan to see you again in 3-4 months  If your triglycerides are still elevated we will try a fish oil type medication called vascepa.

## 2016-07-25 NOTE — Progress Notes (Signed)
   HPI  Patient presents today here for follow-up of hyperlipidemia, hypertriglyceridemia, and hypertension.  Hypertension Good medication compliance No headache, chest pain, palpitations, leg edema Patient states that she checks blood pressure once a week and it's usually 130s over 80s  She is fasting, she has been watching her diet a little bit closer lately. She previously did not want to take statins.  She has had her mammogram this year at [facility] She has an appointment with her GYN who will schedule her DEXA scan. She would like a pneumonia shot if it's due  PMH: Smoking status noted ROS: Per HPI  Objective: BP 140/77   Pulse 73   Temp 97.1 F (36.2 C) (Oral)   Ht '5\' 6"'$  (1.676 m)   Wt 152 lb 9.6 oz (69.2 kg)   BMI 24.63 kg/m  Gen: NAD, alert, cooperative with exam HEENT: NCAT CV: RRR, good S1/S2, no murmur Resp: CTABL, no wheezes, non-labored Neuro: Alert and oriented, No gross deficits  Assessment and plan:  # Hypertension Elevated today- improved after visit Normally controlled at home, continue Prinzide at current dose Follow-up as usual, suspect element of white coat hypertension Repeating CMP  # Hypertriglyceridemia Previous elevation above 400 Have been treating with therapeutic lifestyle changes so far, consider vascepa if still elevated  # Hyperlipidemia Mild elevation, considering age does not need statin. labs  Orders Placed This Encounter  Procedures  . CMP14+EGFR  . Lipid panel    Laroy Apple, MD [facility] 07/25/2016, 8:12 AM

## 2016-07-26 ENCOUNTER — Telehealth: Payer: Self-pay | Admitting: Family Medicine

## 2016-07-26 ENCOUNTER — Other Ambulatory Visit: Payer: Self-pay | Admitting: Family Medicine

## 2016-07-26 DIAGNOSIS — E781 Pure hyperglyceridemia: Secondary | ICD-10-CM

## 2016-07-26 LAB — CMP14+EGFR
ALT: 10 IU/L (ref 0–32)
AST: 6 IU/L (ref 0–40)
Albumin/Globulin Ratio: 1.3 (ref 1.2–2.2)
Albumin: 4.1 g/dL (ref 3.5–4.8)
Alkaline Phosphatase: 87 IU/L (ref 39–117)
BUN/Creatinine Ratio: 24 (ref 12–28)
BUN: 31 mg/dL — ABNORMAL HIGH (ref 8–27)
Bilirubin Total: 0.4 mg/dL (ref 0.0–1.2)
CO2: 25 mmol/L (ref 18–29)
Calcium: 9.6 mg/dL (ref 8.7–10.3)
Chloride: 99 mmol/L (ref 96–106)
Creatinine, Ser: 1.27 mg/dL — ABNORMAL HIGH (ref 0.57–1.00)
GFR calc Af Amer: 48 mL/min/{1.73_m2} — ABNORMAL LOW (ref >59)
GFR calc non Af Amer: 42 mL/min/{1.73_m2} — ABNORMAL LOW (ref >59)
Globulin, Total: 3.1 g/dL (ref 1.5–4.5)
Glucose: 96 mg/dL (ref 65–99)
Potassium: 4.4 mmol/L (ref 3.5–5.2)
Sodium: 141 mmol/L (ref 134–144)
Total Protein: 7.2 g/dL (ref 6.0–8.5)

## 2016-07-26 LAB — LIPID PANEL
Chol/HDL Ratio: 6.7 ratio units — ABNORMAL HIGH (ref 0.0–4.4)
Cholesterol, Total: 254 mg/dL — ABNORMAL HIGH (ref 100–199)
HDL: 38 mg/dL — ABNORMAL LOW (ref >39)
Triglycerides: 411 mg/dL — ABNORMAL HIGH (ref 0–149)

## 2016-07-26 MED ORDER — ICOSAPENT ETHYL 1 G PO CAPS: 2.0000 g | ORAL_CAPSULE | Freq: Two times a day (BID) | ORAL | 11 refills | Status: DC

## 2016-07-26 NOTE — Telephone Encounter (Signed)
Ok to take OTC fish oil capsules 2 twice daily ( 4 G total).  Laroy Apple, MD [facility] 07/26/2016, 5:24 PM

## 2016-07-27 NOTE — Telephone Encounter (Signed)
Pt notified of recommendation Verbalizes understanding 

## 2016-08-01 DIAGNOSIS — M9903 Segmental and somatic dysfunction of lumbar region: Secondary | ICD-10-CM | POA: Diagnosis not present

## 2016-08-01 DIAGNOSIS — M5137 Other intervertebral disc degeneration, lumbosacral region: Secondary | ICD-10-CM | POA: Diagnosis not present

## 2016-08-01 DIAGNOSIS — M9901 Segmental and somatic dysfunction of cervical region: Secondary | ICD-10-CM | POA: Diagnosis not present

## 2016-08-01 DIAGNOSIS — M9902 Segmental and somatic dysfunction of thoracic region: Secondary | ICD-10-CM | POA: Diagnosis not present

## 2016-08-08 DIAGNOSIS — Z6824 Body mass index (BMI) 24.0-24.9, adult: Secondary | ICD-10-CM | POA: Diagnosis not present

## 2016-08-08 DIAGNOSIS — N952 Postmenopausal atrophic vaginitis: Secondary | ICD-10-CM | POA: Diagnosis not present

## 2016-08-08 DIAGNOSIS — Z01419 Encounter for gynecological examination (general) (routine) without abnormal findings: Secondary | ICD-10-CM | POA: Diagnosis not present

## 2016-08-08 DIAGNOSIS — R300 Dysuria: Secondary | ICD-10-CM | POA: Diagnosis not present

## 2016-08-08 DIAGNOSIS — Z1389 Encounter for screening for other disorder: Secondary | ICD-10-CM | POA: Diagnosis not present

## 2016-09-12 DIAGNOSIS — M5137 Other intervertebral disc degeneration, lumbosacral region: Secondary | ICD-10-CM | POA: Diagnosis not present

## 2016-09-12 DIAGNOSIS — M9901 Segmental and somatic dysfunction of cervical region: Secondary | ICD-10-CM | POA: Diagnosis not present

## 2016-09-12 DIAGNOSIS — M9902 Segmental and somatic dysfunction of thoracic region: Secondary | ICD-10-CM | POA: Diagnosis not present

## 2016-09-12 DIAGNOSIS — M9903 Segmental and somatic dysfunction of lumbar region: Secondary | ICD-10-CM | POA: Diagnosis not present

## 2016-10-24 DIAGNOSIS — M9903 Segmental and somatic dysfunction of lumbar region: Secondary | ICD-10-CM | POA: Diagnosis not present

## 2016-10-24 DIAGNOSIS — M9901 Segmental and somatic dysfunction of cervical region: Secondary | ICD-10-CM | POA: Diagnosis not present

## 2016-10-24 DIAGNOSIS — M9902 Segmental and somatic dysfunction of thoracic region: Secondary | ICD-10-CM | POA: Diagnosis not present

## 2016-10-24 DIAGNOSIS — M5137 Other intervertebral disc degeneration, lumbosacral region: Secondary | ICD-10-CM | POA: Diagnosis not present

## 2016-10-27 ENCOUNTER — Encounter: Payer: Self-pay | Admitting: Family Medicine

## 2016-10-27 ENCOUNTER — Ambulatory Visit (INDEPENDENT_AMBULATORY_CARE_PROVIDER_SITE_OTHER): Payer: Medicare Other | Admitting: Family Medicine

## 2016-10-27 VITALS — BP 139/86 | HR 75 | Temp 97.0°F | Ht 66.0 in | Wt 155.0 lb

## 2016-10-27 DIAGNOSIS — E781 Pure hyperglyceridemia: Secondary | ICD-10-CM | POA: Diagnosis not present

## 2016-10-27 DIAGNOSIS — I10 Essential (primary) hypertension: Secondary | ICD-10-CM

## 2016-10-27 DIAGNOSIS — E785 Hyperlipidemia, unspecified: Secondary | ICD-10-CM | POA: Diagnosis not present

## 2016-10-27 NOTE — Patient Instructions (Signed)
Great to see you!  Keep track of about 2-3 blod pressures a week until we see you again in 3 months

## 2016-10-27 NOTE — Progress Notes (Signed)
   HPI  Patient presents today to follow-up for Erin Khan, Erin Khan, hypertension.  Erin Khan Patient taking 2 fish will twice daily, could not afford Vascepa Watching her diet.  Hypertension States that blood pressure usually 120s or 130s at [facility] or at other doctor's offices Good medication compliance, no chest pain.  Erin Khan Does not want to take statins. Watch her overnight, [place] as above  PMH: Smoking status noted ROS: Per HPI  Objective: BP (!) 168/89   Pulse 75   Temp 97 F (36.1 C) (Oral)   Ht '5\' 6"'$  (1.676 m)   Wt 155 lb (70.3 kg)   BMI 25.02 kg/m  Gen: NAD, alert, cooperative with exam HEENT: NCAT CV: RRR, good S1/S2, no murmur Resp: CTABL, no wheezes, non-labored Ext: No edema, warm Neuro: Alert and oriented, No gross deficits  Assessment and plan:  # Hypertension Likely whitecoat hypertension with elevation isolated elevation here Blood pressure log Follow-up 3 months, continue Prinzide  # Erin Khan Previously over 400, patient now taking fish oil Recheck today  # HLD Patient not wanting to take statins, LDL was 111 when checked directly a few months ago, considering her age this is not so severe.    Orders Placed This Encounter  Procedures  . Lipid panel  . [place], MD [facility] 10/27/2016, 8:11 AM

## 2016-10-28 LAB — CMP14+EGFR
ALT: 11 IU/L (ref 0–32)
AST: 11 IU/L (ref 0–40)
Albumin/Globulin Ratio: 1.4 (ref 1.2–2.2)
Albumin: 4.2 g/dL (ref 3.5–4.8)
Alkaline Phosphatase: 76 IU/L (ref 39–117)
BUN/Creatinine Ratio: 24 (ref 12–28)
BUN: 32 mg/dL — ABNORMAL HIGH (ref 8–27)
Bilirubin Total: 0.3 mg/dL (ref 0.0–1.2)
CO2: 21 mmol/L (ref 18–29)
Calcium: 9.6 mg/dL (ref 8.7–10.3)
Chloride: 97 mmol/L (ref 96–106)
Creatinine, Ser: 1.34 mg/dL — ABNORMAL HIGH (ref 0.57–1.00)
GFR calc Af Amer: 45 mL/min/{1.73_m2} — ABNORMAL LOW (ref >59)
GFR calc non Af Amer: 39 mL/min/{1.73_m2} — ABNORMAL LOW (ref >59)
Globulin, Total: 3 g/dL (ref 1.5–4.5)
Glucose: 100 mg/dL — ABNORMAL HIGH (ref 65–99)
Potassium: 4.3 mmol/L (ref 3.5–5.2)
Sodium: 143 mmol/L (ref 134–144)
Total Protein: 7.2 g/dL (ref 6.0–8.5)

## 2016-10-28 LAB — LIPID PANEL
Chol/HDL Ratio: 7.1 ratio units — ABNORMAL HIGH (ref 0.0–4.4)
Cholesterol, Total: 268 mg/dL — ABNORMAL HIGH (ref 100–199)
HDL: 38 mg/dL — ABNORMAL LOW (ref >39)
LDL Calculated: 178 mg/dL — ABNORMAL HIGH (ref 0–99)
Triglycerides: 258 mg/dL — ABNORMAL HIGH (ref 0–149)
VLDL Cholesterol Cal: 52 mg/dL — ABNORMAL HIGH (ref 5–40)

## 2016-11-02 ENCOUNTER — Ambulatory Visit: Payer: Medicare Other | Admitting: Pharmacist

## 2016-11-10 DIAGNOSIS — N8189 Other female genital prolapse: Secondary | ICD-10-CM | POA: Diagnosis not present

## 2016-11-10 DIAGNOSIS — Z960 Presence of urogenital implants: Secondary | ICD-10-CM | POA: Diagnosis not present

## 2016-11-10 DIAGNOSIS — N819 Female genital prolapse, unspecified: Secondary | ICD-10-CM | POA: Diagnosis not present

## 2016-11-17 ENCOUNTER — Ambulatory Visit: Payer: Medicare Other | Admitting: Pharmacist

## 2016-12-08 ENCOUNTER — Ambulatory Visit (INDEPENDENT_AMBULATORY_CARE_PROVIDER_SITE_OTHER): Payer: Medicare Other

## 2016-12-08 ENCOUNTER — Ambulatory Visit (INDEPENDENT_AMBULATORY_CARE_PROVIDER_SITE_OTHER): Payer: Medicare Other | Admitting: Pharmacist

## 2016-12-08 ENCOUNTER — Encounter: Payer: Self-pay | Admitting: Pharmacist

## 2016-12-08 VITALS — BP 136/82 | HR 76 | Ht 64.5 in | Wt 151.0 lb

## 2016-12-08 DIAGNOSIS — Z780 Asymptomatic menopausal state: Secondary | ICD-10-CM

## 2016-12-08 DIAGNOSIS — Z982 Presence of cerebrospinal fluid drainage device: Secondary | ICD-10-CM

## 2016-12-08 DIAGNOSIS — M810 Age-related osteoporosis without current pathological fracture: Secondary | ICD-10-CM | POA: Insufficient documentation

## 2016-12-08 DIAGNOSIS — Z0000 Encounter for general adult medical examination without abnormal findings: Secondary | ICD-10-CM

## 2016-12-08 DIAGNOSIS — Z960 Presence of urogenital implants: Secondary | ICD-10-CM

## 2016-12-08 NOTE — Progress Notes (Signed)
Patient ID: Erin Khan, female   DOB: 30-Aug-1940, 76 y.o.   MRN: [id number]     Subjective:   Erin Khan is a 76 y.o. female who presents for an Initial Medicare Annual Wellness Visit.  Erin Khan is married. She has 2 adult children, 8 grandchildren and 5 great grandchildren.  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  She enjoys continuing to work.    She does not have any health related complaints today.   We are checking a DEXA today:  She denies any history of fractures, no family history of hip fractures or osteoporosis.  Max adult height was 5'6" and today she was measured at 5' 4.5" She is taking calcium 600mg  qd but gets little calcium from her diet.  Current Medications (verified) Outpatient Encounter Prescriptions as of 12/08/2016  Medication Sig  . calcium carbonate (OS-CAL) 600 MG TABS tablet Take 600 mg by mouth daily.  Marland Kitchen lisinopril-hydrochlorothiazide (PRINZIDE,ZESTORETIC) 20-25 MG tablet Take 1 tablet by mouth daily.  . Omega-3 Fatty Acids (FISH OIL) 1000 MG CAPS Take 2 capsules by mouth 2 (two) times daily.  . vitamin B-12 (CYANOCOBALAMIN) 500 MCG tablet Take 500 mcg by mouth daily.   No facility-administered encounter medications on file as of 12/08/2016.     Allergies (verified) Asa [aspirin]   History: Past Medical History:  Diagnosis Date  . Cataract   . Colon polyps   . Hypercholesteremia   . Hypertension    Past Surgical History:  Procedure Laterality Date  . ABDOMINAL HYSTERECTOMY    . BILATERAL OOPHORECTOMY    . CATARACT EXTRACTION, BILATERAL  2014  . COLONOSCOPY    . COLONOSCOPY N/A 05/15/2013   Procedure: COLONOSCOPY;  Surgeon: Rogene Houston, MD;  Location: [facility];  Service: Endoscopy;  Laterality: N/A;  930  . cranial shunt     17 years ago   Family History  Problem Relation Age of Onset  . Lung cancer Sister   . Hypertension Father   . Heart attack Brother     . Arthritis Sister   . Colon cancer Neg Hx    Social History   Occupational History  . Not on file.   Social History Main Topics  . Smoking status: Never Smoker  . Smokeless tobacco: Never Used  . Alcohol use No  . Drug use: No  . Sexual activity: Yes    Do you feel safe at home?  Yes Are there smokers in your home (other than you)? No  Dietary issues and exercise activities: Current Exercise Habits: The patient has a physically strenous job, but has no regular exercise apart from work.;Home exercise routine, Type of exercise: walking, Time (Minutes): 30, Frequency (Times/Week): 2, Weekly Exercise (Minutes/Week): 60, Intensity: Moderate  Current Dietary habits:  Trying to limit high fat foods due to elevated cholesterol. Patient refuses statin therapy.   Objective:    Today's Vitals   12/08/16 1406  BP: 136/82  Pulse: 76  Weight: 151 lb (68.5 kg)  Height: 5' 4.5" (1.638 m)  PainSc: 0-No pain   Body mass index is 25.52 kg/m.   DEXA Results:  T Score for Spine L1-L3 = -2.0 T-Score for total Left Hip = -2.7  Activities of Daily Living In your present state of health, do you have any difficulty performing the following activities: 12/08/2016  Hearing? N  Vision? N  Difficulty concentrating or making decisions?  N  Walking or climbing stairs? N  Dressing or bathing? N  Doing errands, shopping? N  Preparing Food and eating ? N  Using the Toilet? N  In the past six months, have you accidently leaked urine? N  Do you have problems with loss of bowel control? N  Managing your Medications? N  Managing your Finances? N  Housekeeping or managing your Housekeeping? N  Some recent data might be hidden     Cardiac Risk Factors include: advanced age (>64men, >94 women);dyslipidemia;hypertension  Depression Screen PHQ 2/9 Scores 12/08/2016 10/27/2016 07/25/2016 04/18/2016  PHQ - 2 Score 0 0 0 0     Fall Risk Fall Risk  12/08/2016 10/27/2016 07/25/2016 04/18/2016 03/21/2016   Falls in the past year? No No No No No    Cognitive Function: MMSE - Mini Mental State Exam 12/08/2016  Orientation to time 5  Orientation to Place 5  Registration 3  Attention/ Calculation 5  Recall 3  Language- name 2 objects 2  Language- repeat 1  Language- follow 3 step command 3  Language- read & follow direction 1  Write a sentence 1  Copy design 1  Total score 30    Immunizations and Health Maintenance Immunization History  Administered Date(s) Administered  . Influenza,inj,Quad PF,36+ Mos 05/28/2016  . Pneumococcal Conjugate-13 07/25/2016   Health Maintenance Due  Topic Date Due  . Erin Khan  05/13/1960    Patient Care Team: Timmothy Euler, MD as PCP - General (Family Medicine) Paula Compton, MD as Consulting Physician (Obstetrics and Gynecology)  Indicate any recent Medical Services you may have received from other than [facility] providers in the past year (date may be approximate).    Assessment:    Annual Wellness Visit  Osteoporosis   Screening Tests Health Maintenance  Topic Date Due  . TETANUS/TDAP  05/13/1961  . INFLUENZA VACCINE  03/29/2017  . PNA vac Low Risk Adult (2 of 2 - PPSV23) 07/25/2017  . MAMMOGRAM  05/30/2018  . DEXA SCAN  12/09/2018  . COLONOSCOPY  05/16/2023        Plan:   During the course of the visit Erin Khan was educated and counseled about the following appropriate screening and preventive services:   Vaccines to include Pneumoccal, Influenza, Td and Shigles - checked cost of Shingrix ($152.01)and Boostrix ($62.99)  but patient declined due to price.  She is due second Pneumonia vaccine in November 2018  Colorectal cancer screening - UTD  Cardiovascular disease screening   Diabetes screening - last FBG was 100, due to recheck in 2 months  Bone Denisty / Osteoporosis Screening - DEXA done today  Discussed treatment options for osteoporosis.  Patient would like to discuss with her GYN and PCP.  She is instructed to  increase calcium to 600mg  bid.   Mammogram - UTD  PAP - UTD, done at Dr Richardson's office  Glaucoma screening /  Eye Exam - UTD  Nutrition counseling - discussed limiting sugar intake and increase whole grains to help with TG and cholesterol.  Advanced Directives - discussed and information packet given  Physical Activity - increase to 150 minutes per week   Patient Instructions (the written plan) were given to the patient.   Cherre Robins, PharmD   12/09/2016

## 2016-12-08 NOTE — Patient Instructions (Addendum)
Erin Khan , Thank you for taking time to come for your Medicare Wellness Visit. I appreciate your ongoing commitment to your health goals. Please review the following plan we discussed and let me know if I can assist you in the future.   These are the goals we discussed:  Increase calcium to 636m twice a day - take with food  Continue to walk for exercise - goal is to get 150 minutes of exercise each week  To help decrease triglycerides: Increase non-starchy vegetables - carrots, green bean, squash, zucchini, tomatoes, onions, peppers, spinach and other green leafy vegetables, cabbage, lettuce, cucumbers, asparagus, okra (not fried), eggplant Limit sugar and processed foods (cakes, cookies, ice cream, crackers and chips) Increase fresh fruit but limit serving sizes 1/2 cup or about the size of tennis or baseball Limit red meat to no more than 1-2 times per week (serving size about the size of your palm) Choose whole grains / lean proteins - whole wheat bread, quinoa, whole grain rice (1/2 cup), fish, chicken, tKu[place]Avoid sugar and calorie containing beverages - soda, sweet tea and juice.  Choose water or unsweetened tea instead.   This is a list of the screening recommended for you and due dates:  Health Maintenance  Topic Date Due  . Tetanus Vaccine  Now - checked cost $62.99  . DEXA scan (bone density measurement)  Done today  . Flu Shot  03/29/2017  . Pneumonia vaccines (2 of 2 - PPSV23) 07/25/2017  . Mammogram  05/30/2018  . Colon Cancer Screening  05/16/2023   Shingles vaccine - cost is $152.01   Health Maintenance for Postmenopausal Women Menopause is a normal process in which your reproductive ability comes to an end. This process happens gradually over a span of months to years, usually between the ages of 467and 542 Menopause is complete when you have missed 12 consecutive menstrual periods. It is important to talk with your health care provider about some of the most  common conditions that affect postmenopausal women, such as heart disease, cancer, and bone loss (osteoporosis). Adopting a healthy lifestyle and getting preventive care can help to promote your health and wellness. Those actions can also lower your chances of developing some of these common conditions. What should I know about menopause? During menopause, you may experience a number of symptoms, such as:  Moderate-to-severe hot flashes.  Night sweats.  Decrease in sex drive.  Mood swings.  Headaches.  Tiredness.  Irritability.  Memory problems.  Insomnia. Choosing to treat or not to treat menopausal changes is an individual decision that you make with your health care provider. What should I know about hormone replacement therapy and supplements? Hormone therapy products are effective for treating symptoms that are associated with menopause, such as hot flashes and night sweats. Hormone replacement carries certain risks, especially as you become older. If you are thinking about using estrogen or estrogen with progestin treatments, discuss the benefits and risks with your health care provider. What should I know about heart disease and stroke? Heart disease, heart attack, and stroke become more likely as you age. This may be due, in part, to the hormonal changes that your body experiences during menopause. These can affect how your body processes dietary fats, triglycerides, and cholesterol. Heart attack and stroke are both medical emergencies. There are many things that you can do to help prevent heart disease and stroke:  Have your blood pressure checked at least every 1-2 years. High blood pressure causes  heart disease and increases the risk of stroke.  If you are 37-9 years old, ask your health care provider if you should take aspirin to prevent a heart attack or a stroke.  Do not use any tobacco products, including cigarettes, chewing tobacco, or electronic cigarettes. If you  need help quitting, ask your health care provider.  It is important to eat a healthy diet and maintain a healthy weight.  Be sure to include plenty of vegetables, fruits, low-fat dairy products, and lean protein.  Avoid eating foods that are high in solid fats, added sugars, or salt (sodium).  Get regular exercise. This is one of the most important things that you can do for your health.  Try to exercise for at least 150 minutes each week. The type of exercise that you do should increase your heart rate and make you sweat. This is known as moderate-intensity exercise.  Try to do strengthening exercises at least twice each week. Do these in addition to the moderate-intensity exercise.  Know your numbers.Ask your health care provider to check your cholesterol and your blood glucose. Continue to have your blood tested as directed by your health care provider. What should I know about cancer screening? There are several types of cancer. Take the following steps to reduce your risk and to catch any cancer development as early as possible. Breast Cancer  Practice breast self-awareness.  This means understanding how your breasts normally appear and feel.  It also means doing regular breast self-exams. Let your health care provider know about any changes, no matter how small.  If you are 66 or older, have a clinician do a breast exam (clinical breast exam or CBE) every year. Depending on your age, family history, and medical history, it may be recommended that you also have a yearly breast X-ray (mammogram).  If you have a family history of breast cancer, talk with your health care provider about genetic screening.  If you are at high risk for breast cancer, talk with your health care provider about having an MRI and a mammogram every year.  Breast cancer (BRCA) gene test is recommended for women who have family members with BRCA-related cancers. Results of the assessment will determine the  need for genetic counseling and BRCA1 and for BRCA2 testing. BRCA-related cancers include these types:  Breast. This occurs in males or females.  Ovarian.  Tubal. This may also be called fallopian tube cancer.  Cancer of the abdominal or pelvic lining (peritoneal cancer).  Prostate.  Pancreatic. Cervical, Uterine, and Ovarian Cancer  Your health care provider may recommend that you be screened regularly for cancer of the pelvic organs. These include your ovaries, uterus, and vagina. This screening involves a pelvic exam, which includes checking for microscopic changes to the surface of your cervix (Pap test).  For women ages 21-65, health care providers may recommend a pelvic exam and a Pap test every three years. For women ages 52-65, they may recommend the Pap test and pelvic exam, combined with testing for human papilloma virus (HPV), every five years. Some types of HPV increase your risk of cervical cancer. Testing for HPV may also be done on women of any age who have unclear Pap test results.  Other health care providers may not recommend any screening for nonpregnant women who are considered low risk for pelvic cancer and have no symptoms. Ask your health care provider if a screening pelvic exam is right for you.  If you have had past treatment  for cervical cancer or a condition that could lead to cancer, you need Pap tests and screening for cancer for at least 20 years after your treatment. If Pap tests have been discontinued for you, your risk factors (such as having a new sexual partner) need to be reassessed to determine if you should start having screenings again. Some women have medical problems that increase the chance of getting cervical cancer. In these cases, your health care provider may recommend that you have screening and Pap tests more often.  If you have a family history of uterine cancer or ovarian cancer, talk with your health care provider about genetic screening.  If  you have vaginal bleeding after reaching menopause, tell your health care provider.  There are currently no reliable tests available to screen for ovarian cancer. Lung Cancer  Lung cancer screening is recommended for adults 11-87 years old who are at high risk for lung cancer because of a history of smoking. A yearly low-dose CT scan of the lungs is recommended if you:  Currently smoke.  Have a history of at least 30 pack-years of smoking and you currently smoke or have quit within the past 15 years. A pack-year is smoking an average of one pack of cigarettes per day for one year. Yearly screening should:  Continue until it has been 15 years since you quit.  Stop if you develop a health problem that would prevent you from having lung cancer treatment. Colorectal Cancer  This type of cancer can be detected and can often be prevented.  Routine colorectal cancer screening usually begins at age 10 and continues through age 95.  If you have risk factors for colon cancer, your health care provider may recommend that you be screened at an earlier age.  If you have a family history of colorectal cancer, talk with your health care provider about genetic screening.  Your health care provider may also recommend using home test kits to check for hidden blood in your stool.  A small camera at the end of a tube can be used to examine your colon directly (sigmoidoscopy or colonoscopy). This is done to check for the earliest forms of colorectal cancer.  Direct examination of the colon should be repeated every 5-10 years until age 59. However, if early forms of precancerous polyps or small growths are found or if you have a family history or genetic risk for colorectal cancer, you may need to be screened more often. Skin Cancer  Check your skin from head to toe regularly.  Monitor any moles. Be sure to tell your health care provider:  About any new moles or changes in moles, especially if there is  a change in a mole's shape or color.  If you have a mole that is larger than the size of a pencil eraser.  If any of your family members has a history of skin cancer, especially at a young age, talk with your health care provider about genetic screening.  Always use sunscreen. Apply sunscreen liberally and repeatedly throughout the day.  Whenever you are outside, protect yourself by wearing long sleeves, pants, a wide-brimmed hat, and sunglasses. What should I know about osteoporosis? Osteoporosis is a condition in which bone destruction happens more quickly than new bone creation. After menopause, you may be at an increased risk for osteoporosis. To help prevent osteoporosis or the bone fractures that can happen because of osteoporosis, the following is recommended:  If you are 58-86 years old, get at  least 1,000 mg of calcium and at least 600 mg of vitamin D per day.  If you are older than age 52 but younger than age 75, get at least 1,200 mg of calcium and at least 600 mg of vitamin D per day.  If you are older than age 25, get at least 1,200 mg of calcium and at least 800 mg of vitamin D per day. Smoking and excessive alcohol intake increase the risk of osteoporosis. Eat foods that are rich in calcium and vitamin D, and do weight-bearing exercises several times each week as directed by your health care provider. What should I know about how menopause affects my mental health? Depression may occur at any age, but it is more common as you become older. Common symptoms of depression include:  Low or sad mood.  Changes in sleep patterns.  Changes in appetite or eating patterns.  Feeling an overall lack of motivation or enjoyment of activities that you previously enjoyed.  Frequent crying spells. Talk with your health care provider if you think that you are experiencing depression. What should I know about immunizations? It is important that you get and maintain your immunizations.  These include:  Tetanus, diphtheria, and pertussis (Tdap) booster vaccine.  Influenza every year before the flu season begins.  Pneumonia vaccine.  Shingles vaccine. Your health care provider may also recommend other immunizations. This information is not intended to replace advice given to you by your health care provider. Make sure you discuss any questions you have with your health care provider. Document Released: 10/07/2005 Document Revised: 03/04/2016 Document Reviewed: 05/19/2015 Elsevier Interactive Patient Education  2017 [place] Prevention in the Home Falls can cause injuries and can affect people from all age groups. There are many simple things that you can do to make your home safe and to help prevent falls. What can I do on the outside of my home?  Regularly repair the edges of walkways and driveways and fix any cracks.  Remove high doorway thresholds.  Trim any shrubbery on the main path into your home.  Use bright outdoor lighting.  Clear walkways of debris and clutter, including tools and rocks.  Regularly check that handrails are securely fastened and in good repair. Both sides of any steps should have handrails.  Install guardrails along the edges of any raised decks or porches.  Have leaves, snow, and ice cleared regularly.  Use sand or salt on walkways during winter months.  In the garage, clean up any spills right away, including grease or oil spills. What can I do in the bathroom?  Use night lights.  Install grab bars by the toilet and in the tub and shower. Do not use towel bars as grab bars.  Use non-skid mats or decals on the floor of the tub or shower.  If you need to sit down while you are in the shower, use a plastic, non-slip stool.  Keep the floor dry. Immediately clean up any water that spills on the floor.  Remove soap buildup in the tub or shower on a regular basis.  Attach bath mats securely with double-sided  non-slip rug tape.  Remove throw rugs and other tripping hazards from the floor. What can I do in the bedroom?  Use night lights.  Make sure that a bedside light is easy to reach.  Do not use oversized bedding that drapes onto the floor.  Have a firm chair that has side arms to use for  getting dressed.  Remove throw rugs and other tripping hazards from the floor. What can I do in the kitchen?  Clean up any spills right away.  Avoid walking on wet floors.  Place frequently used items in easy-to-reach places.  If you need to reach for something above you, use a sturdy step stool that has a grab bar.  Keep electrical cables out of the way.  Do not use floor polish or wax that makes floors slippery. If you have to use wax, make sure that it is non-skid floor wax.  Remove throw rugs and other tripping hazards from the floor. What can I do in the stairways?  Do not leave any items on the stairs.  Make sure that there are handrails on both sides of the stairs. Fix handrails that are broken or loose. Make sure that handrails are as long as the stairways.  Check any carpeting to make sure that it is firmly attached to the stairs. Fix any carpet that is loose or worn.  Avoid having throw rugs at the top or bottom of stairways, or secure the rugs with carpet tape to prevent them from moving.  Make sure that you have a light switch at the top of the stairs and the bottom of the stairs. If you do not have them, have them installed. What are some other fall prevention tips?  Wear closed-toe shoes that fit well and support your feet. Wear shoes that have rubber soles or low heels.  When you use a stepladder, make sure that it is completely opened and that the sides are firmly locked. Have someone hold the ladder while you are using it. Do not climb a closed stepladder.  Add color or contrast paint or tape to grab bars and handrails in your home. Place contrasting color strips on the  first and last steps.  Use mobility aids as needed, such as canes, walkers, scooters, and crutches.  Turn on lights if it is dark. Replace any light bulbs that burn out.  Set up furniture so that there are clear paths. Keep the furniture in the same spot.  Fix any uneven floor surfaces.  Choose a carpet design that does not hide the edge of steps of a stairway.  Be aware of any and all pets.  Review your medicines with your healthcare provider. Some medicines can cause dizziness or changes in blood pressure, which increase your risk of falling. Talk with your health care provider about other ways that you can decrease your risk of falls. This may include working with a physical therapist or trainer to improve your strength, balance, and endurance. This information is not intended to replace advice given to you by your health care provider. Make sure you discuss any questions you have with your health care provider. Document Released: 08/05/2002 Document Revised: 01/12/2016 Document Reviewed: 09/19/2014 Elsevier Interactive Patient Education  2017 [employer].

## 2016-12-10 ENCOUNTER — Encounter: Payer: Self-pay | Admitting: Family

## 2016-12-10 ENCOUNTER — Ambulatory Visit (INDEPENDENT_AMBULATORY_CARE_PROVIDER_SITE_OTHER): Payer: Medicare Other | Admitting: Family

## 2016-12-10 VITALS — BP 158/81 | HR 78 | Temp 98.7°F | Ht 64.5 in | Wt 153.2 lb

## 2016-12-10 DIAGNOSIS — J069 Acute upper respiratory infection, unspecified: Secondary | ICD-10-CM

## 2016-12-10 MED ORDER — AZITHROMYCIN 250 MG PO TABS: ORAL_TABLET | ORAL | 0 refills | Status: DC

## 2016-12-10 MED ORDER — CETIRIZINE HCL 10 MG PO TABS: 10.0000 mg | ORAL_TABLET | Freq: Every day | ORAL | 11 refills | Status: DC

## 2016-12-10 NOTE — Patient Instructions (Signed)
Upper Respiratory Infection, Adult Most upper respiratory infections (URIs) are a viral infection of the air passages leading to the lungs. A URI affects the nose, throat, and upper air passages. The most common type of URI is nasopharyngitis and is typically referred to as "the common cold." URIs run their course and usually go away on their own. Most of the time, a URI does not require medical attention, but sometimes a bacterial infection in the upper airways can follow a viral infection. This is called a secondary infection. Sinus and middle ear infections are common types of secondary upper respiratory infections. Bacterial pneumonia can also complicate a URI. A URI can worsen asthma and chronic obstructive pulmonary disease (COPD). Sometimes, these complications can require emergency medical care and may be life threatening. What are the causes? Almost all URIs are caused by viruses. A virus is a type of germ and can spread from one person to another. What increases the risk? You may be at risk for a URI if:  You smoke.  You have chronic heart or lung disease.  You have a weakened defense (immune) system.  You are very young or very old.  You have nasal allergies or asthma.  You work in crowded or poorly ventilated areas.  You work in health care facilities or schools.  What are the signs or symptoms? Symptoms typically develop 2-3 days after you come in contact with a cold virus. Most viral URIs last 7-10 days. However, viral URIs from the influenza virus (flu virus) can last 14-18 days and are typically more severe. Symptoms may include:  Runny or stuffy (congested) nose.  Sneezing.  Cough.  Sore throat.  Headache.  Fatigue.  Fever.  Loss of appetite.  Pain in your forehead, behind your eyes, and over your cheekbones (sinus pain).  Muscle aches.  How is this diagnosed? Your health care provider may diagnose a URI by:  Physical exam.  Tests to check that your  symptoms are not due to another condition such as: ? Strep throat. ? Sinusitis. ? Pneumonia. ? Asthma.  How is this treated? A URI goes away on its own with time. It cannot be cured with medicines, but medicines may be prescribed or recommended to relieve symptoms. Medicines may help:  Reduce your fever.  Reduce your cough.  Relieve nasal congestion.  Follow these instructions at home:  Take medicines only as directed by your health care provider.  Gargle warm saltwater or take cough drops to comfort your throat as directed by your health care provider.  Use a warm mist humidifier or inhale steam from a shower to increase air moisture. This may make it easier to breathe.  Drink enough fluid to keep your urine clear or pale yellow.  Eat soups and other clear broths and maintain good nutrition.  Rest as needed.  Return to work when your temperature has returned to normal or as your health care provider advises. You may need to stay home longer to avoid infecting others. You can also use a face mask and careful hand washing to prevent spread of the virus.  Increase the usage of your inhaler if you have asthma.  Do not use any tobacco products, including cigarettes, chewing tobacco, or electronic cigarettes. If you need help quitting, ask your health care provider. How is this prevented? The best way to protect yourself from getting a cold is to practice good hygiene.  Avoid oral or hand contact with people with cold symptoms.  Wash your   hands often if contact occurs.  There is no clear evidence that vitamin C, vitamin E, echinacea, or exercise reduces the chance of developing a cold. However, it is always recommended to get plenty of rest, exercise, and practice good nutrition. Contact a health care provider if:  You are getting worse rather than better.  Your symptoms are not controlled by medicine.  You have chills.  You have worsening shortness of breath.  You have  brown or red mucus.  You have yellow or brown nasal discharge.  You have pain in your face, especially when you bend forward.  You have a fever.  You have swollen neck glands.  You have pain while swallowing.  You have white areas in the back of your throat. Get help right away if:  You have severe or persistent: ? Headache. ? Ear pain. ? Sinus pain. ? Chest pain.  You have chronic lung disease and any of the following: ? Wheezing. ? Prolonged cough. ? Coughing up blood. ? A change in your usual mucus.  You have a stiff neck.  You have changes in your: ? Vision. ? Hearing. ? Thinking. ? Mood. This information is not intended to replace advice given to you by your health care provider. Make sure you discuss any questions you have with your health care provider. Document Released: 02/08/2001 Document Revised: 04/17/2016 Document Reviewed: 11/20/2013 Elsevier Interactive Patient Education  2017 Elsevier Inc.  

## 2016-12-10 NOTE — Progress Notes (Signed)
   Subjective:    Patient ID: Erin Khan, female    DOB: 04/08/1941, 76 y.o.   MRN: [id number]  Cough  This is a new problem. The current episode started in the past 7 days. The problem has been gradually worsening. The problem occurs every few minutes. The cough is productive of sputum and productive of purulent sputum. Associated symptoms include myalgias, postnasal drip, rhinorrhea, a sore throat, shortness of breath and wheezing. Pertinent negatives include no chills, ear congestion, ear pain, fever, headaches or nasal congestion. The symptoms are aggravated by lying down. She has tried OTC cough suppressant for the symptoms. The treatment provided mild relief.      Review of Systems  Constitutional: Negative for chills and fever.  HENT: Positive for postnasal drip, rhinorrhea and sore throat. Negative for ear pain.   Respiratory: Positive for cough, shortness of breath and wheezing.   Musculoskeletal: Positive for myalgias.  Neurological: Negative for headaches.  All other systems reviewed and are negative.      Objective:   Physical Exam  Constitutional: She is oriented to person, place, and time. She appears well-developed and well-nourished. No distress.  HENT:  Head: Normocephalic and atraumatic.  Right Ear: External ear normal.  Left Ear: External ear normal.  Nose: Mucosal edema and rhinorrhea present.  Mouth/Throat: Posterior oropharyngeal erythema present.  Eyes: Pupils are equal, round, and reactive to light.  Neck: Normal range of motion. Neck supple. No thyromegaly present.  Cardiovascular: Normal rate, regular rhythm, normal heart sounds and intact distal pulses.   No murmur heard. Pulmonary/Chest: Effort normal and breath sounds normal. No respiratory distress. She has no wheezes.  Intermittent nonproductive cough   Abdominal: Soft. Bowel sounds are normal. She exhibits no distension. There is no tenderness.  Musculoskeletal: Normal range of motion. She exhibits  no edema or tenderness.  Neurological: She is alert and oriented to person, place, and time. She has normal reflexes. No cranial nerve deficit.  Skin: Skin is warm and dry.  Psychiatric: She has a normal mood and affect. Her behavior is normal. Judgment and thought content normal.  Vitals reviewed.     BP (!) 158/81   Pulse 78   Temp 98.7 F (37.1 C) (Oral)   Ht 5' 4.5" (1.638 m)   Wt 153 lb 3.2 oz (69.5 kg)   BMI 25.89 kg/m      Assessment & Plan:  1. Upper respiratory tract infection, unspecified type - Take meds as prescribed - Use a cool mist humidifier  -Use saline nose sprays frequently -Saline irrigations of the nose can be very helpful if done frequently.  * 4X daily for 1 week*  * Use of a nettie pot can be helpful with this. Follow directions with this* -Force fluids -For any cough or congestion  Use plain Mucinex- regular strength or max strength is fine   * Children- consult with Pharmacist for dosing -For fever or aces or pains- take tylenol or ibuprofen appropriate for age and weight.  * for fevers greater than 101 orally you may alternate ibuprofen and tylenol every  3 hours. -Throat lozenges if help - cetirizine (ZYRTEC) 10 MG tablet; Take 1 tablet (10 mg total) by mouth daily.  Dispense: 30 tablet; Refill: 11 - azithromycin (ZITHROMAX) 250 MG tablet; Take 500 mg once, then 250 mg for four days  Dispense: 6 tablet; Refill: 0   Evelina Dun, FNP

## 2016-12-16 ENCOUNTER — Ambulatory Visit (INDEPENDENT_AMBULATORY_CARE_PROVIDER_SITE_OTHER): Payer: Medicare Other | Admitting: Family Medicine

## 2016-12-16 ENCOUNTER — Encounter: Payer: Self-pay | Admitting: Family Medicine

## 2016-12-16 VITALS — BP 139/82 | HR 82 | Temp 98.0°F | Resp 18 | Ht 64.5 in | Wt 153.6 lb

## 2016-12-16 DIAGNOSIS — R52 Pain, unspecified: Secondary | ICD-10-CM | POA: Diagnosis not present

## 2016-12-16 NOTE — Progress Notes (Signed)
   HPI  Patient presents today here with continued illness.  Patient explains that she's been ill now for 7-8 days. She was seen last week and treated with azithromycin with no good improvement.  Patient complains of nasal congestion, chest congestion, sore throat, and body aches.  She denies any fevers, chills, or sweats.  She's tolerating food and fluids easily. She denies any shortness of breath.  PMH: Smoking status noted ROS: Per HPI  Objective: BP 139/82   Pulse 82   Temp 98 F (36.7 C) (Oral)   Resp 18   Ht 5' 4.5" (1.638 m)   Wt 153 lb 9.6 oz (69.7 kg)   SpO2 97%   BMI 25.96 kg/m  Gen: NAD, alert, cooperative with exam HEENT: NCAT, oropharynx clear and moist, TMs normal bilaterally CV: RRR, good S1/S2, no murmur Resp: CTABL, no wheezes, non-labored Ext: No edema, warm Neuro: Alert and oriented, No gross deficits  Assessment and plan:  # Body aches, flulike symptoms Patient without good response to azithromycin, currently with persistent symptoms over 7 days. Her lung exam is reassuring. With body aches and prolonged illness I explained that the most likely explanation would be influenza, we have had a few positives this week still in our community. Considering that she is at day 7 or 8  I do not believe that Tamiflu would be beneficial, testing would also likely not be very accurate. CBC, BMP ensure normal renal function and check WBC. If needed may return next week for chest x-ray., Follow-up in clinic with any concerns or failure to improve as expected.    Orders Placed This Encounter  Procedures  . CBC with Differential/Platelet  . [place], MD [facility] 12/16/2016, 6:31 PM

## 2016-12-17 LAB — CBC WITH DIFFERENTIAL/PLATELET
Basophils Absolute: 0.1 10*3/uL (ref 0.0–0.2)
Basos: 1 %
EOS (ABSOLUTE): 0.3 10*3/uL (ref 0.0–0.4)
Eos: 4 %
Hematocrit: 36.1 % (ref 34.0–46.6)
Hemoglobin: 12.5 g/dL (ref 11.1–15.9)
Immature Grans (Abs): 0 10*3/uL (ref 0.0–0.1)
Immature Granulocytes: 0 %
Lymphocytes Absolute: 2.5 10*3/uL (ref 0.7–3.1)
Lymphs: 35 %
MCH: 30.8 pg (ref 26.6–33.0)
MCHC: 34.6 g/dL (ref 31.5–35.7)
MCV: 89 fL (ref 79–97)
Monocytes Absolute: 0.9 10*3/uL (ref 0.1–0.9)
Monocytes: 12 %
Neutrophils Absolute: 3.5 10*3/uL (ref 1.4–7.0)
Neutrophils: 48 %
Platelets: 241 10*3/uL (ref 150–379)
RBC: 4.06 x10E6/uL (ref 3.77–5.28)
RDW: 13 % (ref 12.3–15.4)
WBC: 7.2 10*3/uL (ref 3.4–10.8)

## 2016-12-17 LAB — BMP8+EGFR
BUN/Creatinine Ratio: 29 — ABNORMAL HIGH (ref 12–28)
BUN: 35 mg/dL — ABNORMAL HIGH (ref 8–27)
CO2: 22 mmol/L (ref 18–29)
Calcium: 9.3 mg/dL (ref 8.7–10.3)
Chloride: 102 mmol/L (ref 96–106)
Creatinine, Ser: 1.21 mg/dL — ABNORMAL HIGH (ref 0.57–1.00)
GFR calc Af Amer: 51 mL/min/{1.73_m2} — ABNORMAL LOW (ref >59)
GFR calc non Af Amer: 44 mL/min/{1.73_m2} — ABNORMAL LOW (ref >59)
Glucose: 90 mg/dL (ref 65–99)
Potassium: 3.9 mmol/L (ref 3.5–5.2)
Sodium: 142 mmol/L (ref 134–144)

## 2016-12-26 DIAGNOSIS — M9901 Segmental and somatic dysfunction of cervical region: Secondary | ICD-10-CM | POA: Diagnosis not present

## 2016-12-26 DIAGNOSIS — M9902 Segmental and somatic dysfunction of thoracic region: Secondary | ICD-10-CM | POA: Diagnosis not present

## 2016-12-26 DIAGNOSIS — M5137 Other intervertebral disc degeneration, lumbosacral region: Secondary | ICD-10-CM | POA: Diagnosis not present

## 2016-12-26 DIAGNOSIS — M9903 Segmental and somatic dysfunction of lumbar region: Secondary | ICD-10-CM | POA: Diagnosis not present

## 2017-01-13 ENCOUNTER — Ambulatory Visit (INDEPENDENT_AMBULATORY_CARE_PROVIDER_SITE_OTHER): Payer: Medicare Other | Admitting: Physician Assistant

## 2017-01-13 ENCOUNTER — Encounter: Payer: Self-pay | Admitting: Physician Assistant

## 2017-01-13 VITALS — BP 133/77 | HR 80 | Temp 97.0°F | Ht 64.5 in | Wt 153.0 lb

## 2017-01-13 DIAGNOSIS — R600 Localized edema: Secondary | ICD-10-CM | POA: Diagnosis not present

## 2017-01-13 MED ORDER — HYDROCHLOROTHIAZIDE 25 MG PO TABS: 25.0000 mg | ORAL_TABLET | Freq: Every day | ORAL | 0 refills | Status: DC

## 2017-01-13 NOTE — Patient Instructions (Signed)
Edema Edema is when you have too much fluid in your body or under your skin. Edema may make your legs, feet, and ankles swell up. Swelling is also common in looser tissues, like around your eyes. This is a common condition. It gets more common as you get older. There are many possible causes of edema. Eating too much salt (sodium) and being on your feet or sitting for a long time can cause edema in your legs, feet, and ankles. Hot weather may make edema worse. Edema is usually painless. Your skin may look swollen or shiny. Follow these instructions at home:  Keep the swollen body part raised (elevated) above the level of your heart when you are sitting or lying down.  Do not sit still or stand for a long time.  Do not wear tight clothes. Do not wear garters on your upper legs.  Exercise your legs. This can help the swelling go down.  Wear elastic bandages or support stockings as told by your doctor.  Eat a low-salt (low-sodium) diet to reduce fluid as told by your doctor.  Depending on the cause of your swelling, you may need to limit how much fluid you drink (fluid restriction).  Take over-the-counter and prescription medicines only as told by your doctor. Contact a doctor if:  Treatment is not working.  You have heart, liver, or kidney disease and have symptoms of edema.  You have sudden and unexplained weight gain. Get help right away if:  You have shortness of breath or chest pain.  You cannot breathe when you lie down.  You have pain, redness, or warmth in the swollen areas.  You have heart, liver, or kidney disease and get edema all of a sudden.  You have a fever and your symptoms get worse all of a sudden. Summary  Edema is when you have too much fluid in your body or under your skin.  Edema may make your legs, feet, and ankles swell up. Swelling is also common in looser tissues, like around your eyes.  Raise (elevate) the swollen body part above the level of your  heart when you are sitting or lying down.  Follow your doctor's instructions about diet and how much fluid you can drink (fluid restriction). This information is not intended to replace advice given to you by your health care provider. Make sure you discuss any questions you have with your health care provider. Document Released: 02/01/2008 Document Revised: 09/02/2016 Document Reviewed: 09/02/2016 Elsevier Interactive Patient Education  2017 Elsevier Inc.  

## 2017-01-16 NOTE — Progress Notes (Signed)
BP 133/77   Pulse 80   Temp 97 F (36.1 C) (Oral)   Ht 5' 4.5" (1.638 m)   Wt 153 lb (69.4 kg)   BMI 25.86 kg/m    Subjective:    Patient ID: Erin Khan, female    DOB: 1940/10/01, 76 y.o.   MRN: [id number]  HPI: Erin Khan is a 76 y.o. female presenting on 01/13/2017 for Foot Problem (right outer soreness & some swelling for 2 weeks now)  She has had off and on swelling in the right foot.  It was very brief. However This time it has lasted for about 1 week. It is worse at the end of the day. She does work a job where she stands at all times. She does not have any severe pain or range of motion issues in this foot. She does not know of any injury. When you press on the foot she states she has a little bit of pain in the fourth metatarsal tarsal area and down the side of the foot. The pain in the heel. No significant changes with walking up or down stairs or when she first gets up in the morning.  Relevant past medical, surgical, family and social history reviewed and updated as indicated. Allergies and medications reviewed and updated.  Past Medical History:  Diagnosis Date  . Cataract   . Colon polyps   . Hypercholesteremia   . Hypertension     Past Surgical History:  Procedure Laterality Date  . ABDOMINAL HYSTERECTOMY    . BILATERAL OOPHORECTOMY    . CATARACT EXTRACTION, BILATERAL  2014  . COLONOSCOPY    . COLONOSCOPY N/A 05/15/2013   Procedure: COLONOSCOPY;  Surgeon: Rogene Houston, MD;  Location: [facility];  Service: Endoscopy;  Laterality: N/A;  930  . cranial shunt     17 years ago    Review of Systems  Constitutional: Negative.  Negative for activity change, fatigue and fever.  HENT: Negative.   Eyes: Negative.   Respiratory: Negative.  Negative for cough.   Cardiovascular: Positive for leg swelling. Negative for chest pain.  Gastrointestinal: Negative.  Negative for abdominal pain.  Endocrine: Negative.   Genitourinary: Negative.  Negative for dysuria.    Musculoskeletal: Negative.  Negative for arthralgias.  Skin: Negative.  Negative for pallor, rash and wound.  Neurological: Negative.     Allergies as of 01/13/2017      Reactions   Asa [aspirin] Hives, Swelling      Medication List       Accurate as of 01/13/17 11:59 PM. Always use your most recent med list.          calcium carbonate 600 MG Tabs tablet Commonly known as:  OS-CAL Take 600 mg by mouth daily.   cetirizine 10 MG tablet Commonly known as:  ZYRTEC Take 1 tablet (10 mg total) by mouth daily.   ESTRACE VAGINAL 0.1 MG/GM vaginal cream Generic drug:  estradiol Estrace 0.01% (0.1 mg/gram) vaginal cream   Fish Oil 1000 MG Caps Take 2 capsules by mouth 2 (two) times daily.   hydrochlorothiazide 25 MG tablet Commonly known as:  HYDRODIURIL Take 1 tablet (25 mg total) by mouth daily.   lisinopril-hydrochlorothiazide 20-25 MG tablet Commonly known as:  PRINZIDE,ZESTORETIC Take 1 tablet by mouth daily.   vitamin B-12 500 MCG tablet Commonly known as:  CYANOCOBALAMIN Take 500 mcg by mouth daily.          Objective:    BP  133/77   Pulse 80   Temp 97 F (36.1 C) (Oral)   Ht 5' 4.5" (1.638 m)   Wt 153 lb (69.4 kg)   BMI 25.86 kg/m   Allergies  Allergen Reactions  . Asa [Aspirin] Hives and Swelling    Physical Exam  Constitutional: She is oriented to person, place, and time. She appears well-developed and well-nourished.  HENT:  Head: Normocephalic and atraumatic.  Eyes: Conjunctivae and EOM are normal. Pupils are equal, round, and reactive to light.  Cardiovascular: Normal rate, regular rhythm, normal heart sounds and intact distal pulses.   Pulmonary/Chest: Effort normal and breath sounds normal.  Abdominal: Soft. Bowel sounds are normal.  Musculoskeletal:       Right ankle: She exhibits swelling. She exhibits normal range of motion, no deformity and normal pulse. Achilles tendon normal.       Feet:  Localized mild edema from the ankle down.  Possible vague bluish tone of the skin when compared to the left foot. This is extremely minimal.  Neurological: She is alert and oriented to person, place, and time. She has normal reflexes.  Skin: Skin is warm and dry. No rash noted.  Psychiatric: She has a normal mood and affect. Her behavior is normal. Judgment and thought content normal.        Assessment & Plan:   1. Localized edema - hydrochlorothiazide (HYDRODIURIL) 25 MG tablet; Take 1 tablet (25 mg total) by mouth daily.  Dispense: 30 tablet; Refill: 0   Take for one week, if not improved call back. Wear compression stocking. Consider flow studies to evaluate right foot.   Continue all other maintenance medications as listed above.  Follow up plan: Follow-up as needed or worsening of symptoms. Call office for any issues.   Educational handout given for edema  Terald Sleeper PA-C [facility] [address]  [place], [place] [address] [phone]   01/16/2017, 7:58 AM

## 2017-01-27 ENCOUNTER — Ambulatory Visit (INDEPENDENT_AMBULATORY_CARE_PROVIDER_SITE_OTHER): Payer: Medicare Other | Admitting: Family Medicine

## 2017-01-27 ENCOUNTER — Encounter: Payer: Self-pay | Admitting: Family Medicine

## 2017-01-27 VITALS — BP 144/80 | HR 81 | Temp 97.5°F | Ht 64.5 in | Wt 151.8 lb

## 2017-01-27 DIAGNOSIS — M79671 Pain in right foot: Secondary | ICD-10-CM

## 2017-01-27 DIAGNOSIS — E781 Pure hyperglyceridemia: Secondary | ICD-10-CM

## 2017-01-27 DIAGNOSIS — I10 Essential (primary) hypertension: Secondary | ICD-10-CM

## 2017-01-27 MED ORDER — ICOSAPENT ETHYL 0.5 G PO CAPS: 2.0000 g | ORAL_CAPSULE | Freq: Two times a day (BID) | ORAL | 3 refills | Status: DC

## 2017-01-27 NOTE — Progress Notes (Signed)
   HPI  Patient presents today to follow-up for chronic medical conditions.  Patient explains that she's been doing well with her exercise and diet. Patient walks on a daily basis. She's taking fish oil for hypertriglyceridemia, she does not limit take statins.  Patient had right foot pain last month, this was likely treated to edema which has improved with HCTZ. She has finished the course of extra HCTZ.  Blood pressure at home generally runs in the 448J systolic. No Chest pain, dyspnea, palpitations, leg edema.  PMH: Smoking status noted ROS: Per HPI  Objective: BP (!) 144/80   Pulse 81   Temp 97.5 F (36.4 C) (Oral)   Ht 5' 4.5" (1.638 m)   Wt 151 lb 12.8 oz (68.9 kg)   BMI 25.65 kg/m  Gen: NAD, alert, cooperative with exam HEENT: NCAT CV: RRR, good S1/S2, no murmur Resp: CTABL, no wheezes, non-labored Ext: No edema, warm Neuro: Alert and oriented, No gross deficits MSK:  Small slightly tender area on the distal lateral right foot, no appreciated swelling at this time  Assessment and plan:  # Hypertriglyceridemia Patient with elevated LDL, severely, and hypertrophic list right Change fish oil to Vascepa Samples given   # Hypertension Elevated today, however given age and good control at home I don't believe more medication as necessary. Continue Prinzide only  # Right foot pain Improving, mild, no need for further workup Continue monitor, x-ray if needed    Meds ordered this encounter  Medications  . Icosapent Ethyl (VASCEPA) 0.5 g CAPS    Sig: Take 2 g by mouth 2 (two) times daily.    Dispense:  720 capsule    Refill:  [place], MD [facility] 01/27/2017, 2:19 PM

## 2017-01-27 NOTE — Patient Instructions (Signed)
Great to see you!  Try vascepa next time you buy more fish oil  Call me if you can tolerate the large pills.

## 2017-02-06 DIAGNOSIS — M9903 Segmental and somatic dysfunction of lumbar region: Secondary | ICD-10-CM | POA: Diagnosis not present

## 2017-02-06 DIAGNOSIS — M5137 Other intervertebral disc degeneration, lumbosacral region: Secondary | ICD-10-CM | POA: Diagnosis not present

## 2017-02-06 DIAGNOSIS — M9905 Segmental and somatic dysfunction of pelvic region: Secondary | ICD-10-CM | POA: Diagnosis not present

## 2017-02-06 DIAGNOSIS — M9904 Segmental and somatic dysfunction of sacral region: Secondary | ICD-10-CM | POA: Diagnosis not present

## 2017-03-18 ENCOUNTER — Other Ambulatory Visit: Payer: Self-pay | Admitting: Family Medicine

## 2017-04-03 DIAGNOSIS — R102 Pelvic and perineal pain: Secondary | ICD-10-CM | POA: Diagnosis not present

## 2017-04-03 DIAGNOSIS — Z960 Presence of urogenital implants: Secondary | ICD-10-CM | POA: Diagnosis not present

## 2017-04-20 ENCOUNTER — Other Ambulatory Visit (HOSPITAL_COMMUNITY): Payer: Self-pay | Admitting: Obstetrics and Gynecology

## 2017-04-20 DIAGNOSIS — Z1231 Encounter for screening mammogram for malignant neoplasm of breast: Secondary | ICD-10-CM

## 2017-04-26 DIAGNOSIS — X32XXXA Exposure to sunlight, initial encounter: Secondary | ICD-10-CM | POA: Diagnosis not present

## 2017-04-26 DIAGNOSIS — L821 Other seborrheic keratosis: Secondary | ICD-10-CM | POA: Diagnosis not present

## 2017-04-26 DIAGNOSIS — L570 Actinic keratosis: Secondary | ICD-10-CM | POA: Diagnosis not present

## 2017-05-30 ENCOUNTER — Ambulatory Visit (INDEPENDENT_AMBULATORY_CARE_PROVIDER_SITE_OTHER): Payer: Medicare Other | Admitting: Family Medicine

## 2017-05-30 ENCOUNTER — Encounter: Payer: Self-pay | Admitting: Family Medicine

## 2017-05-30 VITALS — BP 141/78 | HR 81 | Temp 98.5°F | Ht 64.5 in | Wt 151.6 lb

## 2017-05-30 DIAGNOSIS — E785 Hyperlipidemia, unspecified: Secondary | ICD-10-CM | POA: Diagnosis not present

## 2017-05-30 DIAGNOSIS — Z23 Encounter for immunization: Secondary | ICD-10-CM | POA: Diagnosis not present

## 2017-05-30 DIAGNOSIS — I10 Essential (primary) hypertension: Secondary | ICD-10-CM

## 2017-05-30 NOTE — Progress Notes (Signed)
   HPI  Patient presents today  Here for follow up chronic medical conditions,  HLD Has not tolerated statins, patient is taking Fish will 2 g twice daily. She could not afford vascepa as her insurance dsid not cover it well. Codst was over 400$ a month  Hypertension Patient states that blood pressures consistently 110-120over 70s when she checks her blood pressure outside of our office. No chest pain, dyspnea, or difficulty tolerating medications. Good medication compliance  PMH: Smoking status noted ROS: Per HPI  Objective: BP (!) 141/78   Pulse 81   Temp 98.5 F (36.9 C) (Oral)   Ht 5' 4.5" (1.638 m)   Wt 151 lb 9.6 oz (68.8 kg)   BMI 25.62 kg/m  Gen: NAD, alert, cooperative with exam HEENT: NCAT CV: RRR, good S1/S2, no murmur Resp: CTABL, no wheezes, non-labored Ext: No edema, warm Neuro: Alert and oriented, No gross deficits  Assessment and plan:  # HTN Well controlled, no changes Labs- fasting futrue  # HLD Does not tolerate statins Consider welchol, zetia Continue fish oil. Also with HTG  Need for influenza vaccine- counseling provided for all vaccine components    Orders Placed This Encounter  Procedures  . Flu vaccine HIGH DOSE PF  . CMP14+EGFR    Standing Status:   Future    Standing Expiration Date:   05/30/2018  . Lipid panel    Standing Status:   Future    Standing Expiration Date:   05/30/2018     Laroy Apple, MD [facility] 05/30/2017, 1:28 PM

## 2017-05-30 NOTE — Patient Instructions (Signed)
Great to see you!  Come back for fasting labs in the next week or two.   Consider Welchol.

## 2017-06-01 ENCOUNTER — Ambulatory Visit (HOSPITAL_COMMUNITY)
Admission: RE | Admit: 2017-06-01 | Discharge: 2017-06-01 | Disposition: A | Discharge status: Home / Self Care | Payer: Medicare Other | Source: Ambulatory Visit | Attending: Obstetrics and Gynecology | Admitting: Obstetrics and Gynecology

## 2017-06-01 DIAGNOSIS — Z1231 Encounter for screening mammogram for malignant neoplasm of breast: Secondary | ICD-10-CM | POA: Diagnosis not present

## 2017-06-06 ENCOUNTER — Encounter: Payer: Self-pay | Admitting: Family Medicine

## 2017-06-06 ENCOUNTER — Ambulatory Visit (INDEPENDENT_AMBULATORY_CARE_PROVIDER_SITE_OTHER): Payer: Medicare Other | Admitting: Family Medicine

## 2017-06-06 VITALS — BP 139/71 | HR 78 | Temp 98.6°F | Ht 64.5 in | Wt 149.2 lb

## 2017-06-06 DIAGNOSIS — J189 Pneumonia, unspecified organism: Secondary | ICD-10-CM

## 2017-06-06 MED ORDER — METHYLPREDNISOLONE ACETATE 40 MG/ML IJ SUSP
40.0000 mg | Freq: Once | INTRAMUSCULAR | Status: AC
  Administered 2017-06-06: 40 mg via INTRAMUSCULAR

## 2017-06-06 MED ORDER — AZITHROMYCIN 250 MG PO TABS
ORAL_TABLET | ORAL | 0 refills | Status: DC
Start: 2017-06-06 — End: 2017-07-07

## 2017-06-06 NOTE — Addendum Note (Signed)
Addended by: Nigel Berthold C on: 06/06/2017 09:11 AM   Modules accepted: Orders

## 2017-06-06 NOTE — Patient Instructions (Signed)
Great to see you!  Call or come back with any concerns  Start azithromycin today.   Acute Bronchitis, Adult Acute bronchitis is when air tubes (bronchi) in the lungs suddenly get swollen. The condition can make it hard to breathe. It can also cause these symptoms:  A cough.  Coughing up clear, yellow, or green mucus.  Wheezing.  Chest congestion.  Shortness of breath.  A fever.  Body aches.  Chills.  A sore throat.  Follow these instructions at home: Medicines  Take over-the-counter and prescription medicines only as told by your doctor.  If you were prescribed an antibiotic medicine, take it as told by your doctor. Do not stop taking the antibiotic even if you start to feel better. General instructions  Rest.  Drink enough fluids to keep your pee (urine) clear or pale yellow.  Avoid smoking and secondhand smoke. If you smoke and you need help quitting, ask your doctor. Quitting will help your lungs heal faster.  Use an inhaler, cool mist vaporizer, or humidifier as told by your doctor.  Keep all follow-up visits as told by your doctor. This is important. How is this prevented? To lower your risk of getting this condition again:  Wash your hands often with soap and water. If you cannot use soap and water, use hand sanitizer.  Avoid contact with people who have cold symptoms.  Try not to touch your hands to your mouth, nose, or eyes.  Make sure to get the flu shot every year.  Contact a doctor if:  Your symptoms do not get better in 2 weeks. Get help right away if:  You cough up blood.  You have chest pain.  You have very bad shortness of breath.  You become dehydrated.  You faint (pass out) or keep feeling like you are going to pass out.  You keep throwing up (vomiting).  You have a very bad headache.  Your fever or chills gets worse. This information is not intended to replace advice given to you by your health care provider. Make sure you  discuss any questions you have with your health care provider. Document Released: 02/01/2008 Document Revised: 03/23/2016 Document Reviewed: 02/03/2016 Elsevier Interactive Patient Education  2017 [employer].

## 2017-06-06 NOTE — Progress Notes (Signed)
   HPI  Patient presents today here with acute illness.  Patient complains of nasal congestion, cough, chest congestion, sore throat, and upper chest pain with cough for about 3 days.  This all started after her flu immunization last Wednesday. She states that she had expected body aches and malaise for about 24-48 hours. She had almost resolved symptoms on Friday and Saturday developed or sore throat.  Patient states that she is breathing okay. She is tolerating food and fluids like usual. She does have subjective fever  PMH: Smoking status noted ROS: Per HPI  Objective: BP 139/71   Pulse 78   Temp 98.6 F (37 C) (Oral)   Ht 5' 4.5" (1.638 m)   Wt 149 lb 3.2 oz (67.7 kg)   BMI 25.21 kg/m  Gen: NAD, alert, cooperative with exam HEENT: NCAT, oropharynx moist and clear, TMs normal bilaterally, nares clear CV: RRR, good S1/S2, no murmur Resp: CTABL, no wheezes, non-labored Ext: No edema, warm Neuro: Alert and oriented, No gross deficits  Assessment and plan:  # Walking pneumonia Most likely diagnosis given her significance of symptoms including subjective fever, persistent cough, and upper chest pain. Also given IM Depo-Medrol, 40 mg Discussed supportive care over-the-counter Return to clinic with any concerns   Meds ordered this encounter  Medications  . azithromycin (ZITHROMAX) 250 MG tablet    Sig: Take 2 tablets on day 1 and 1 tablet daily after that    Dispense:  6 tablet    Refill:  0    Laroy Apple, MD [facility] 06/06/2017, 9:08 AM

## 2017-06-16 ENCOUNTER — Other Ambulatory Visit: Payer: Self-pay

## 2017-06-16 MED ORDER — LISINOPRIL-HYDROCHLOROTHIAZIDE 20-25 MG PO TABS: 1.0000 | ORAL_TABLET | Freq: Every day | ORAL | 1 refills | Status: DC

## 2017-07-03 ENCOUNTER — Other Ambulatory Visit: Payer: Medicare Other

## 2017-07-03 DIAGNOSIS — E785 Hyperlipidemia, unspecified: Secondary | ICD-10-CM | POA: Diagnosis not present

## 2017-07-03 NOTE — Addendum Note (Signed)
Addended by: Earlene Plater on: 07/03/2017 09:03 AM   Modules accepted: Orders

## 2017-07-04 ENCOUNTER — Other Ambulatory Visit: Payer: Self-pay | Admitting: Family Medicine

## 2017-07-04 LAB — CMP14+EGFR
ALT: 13 IU/L (ref 0–32)
AST: 8 IU/L (ref 0–40)
Albumin/Globulin Ratio: 1.4 (ref 1.2–2.2)
Albumin: 4.3 g/dL (ref 3.5–4.8)
Alkaline Phosphatase: 79 IU/L (ref 39–117)
BUN/Creatinine Ratio: 25 (ref 12–28)
BUN: 33 mg/dL — ABNORMAL HIGH (ref 8–27)
Bilirubin Total: 0.4 mg/dL (ref 0.0–1.2)
CO2: 23 mmol/L (ref 20–29)
Calcium: 9.5 mg/dL (ref 8.7–10.3)
Chloride: 103 mmol/L (ref 96–106)
Creatinine, Ser: 1.34 mg/dL — ABNORMAL HIGH (ref 0.57–1.00)
GFR calc Af Amer: 45 mL/min/{1.73_m2} — ABNORMAL LOW (ref >59)
GFR calc non Af Amer: 39 mL/min/{1.73_m2} — ABNORMAL LOW (ref >59)
Globulin, Total: 3.1 g/dL (ref 1.5–4.5)
Glucose: 97 mg/dL (ref 65–99)
Potassium: 4 mmol/L (ref 3.5–5.2)
Sodium: 143 mmol/L (ref 134–144)
Total Protein: 7.4 g/dL (ref 6.0–8.5)

## 2017-07-04 LAB — LIPID PANEL
Chol/HDL Ratio: 7.9 ratio — ABNORMAL HIGH (ref 0.0–4.4)
Cholesterol, Total: 330 mg/dL — ABNORMAL HIGH (ref 100–199)
HDL: 42 mg/dL (ref >39)
LDL Calculated: 226 mg/dL — ABNORMAL HIGH (ref 0–99)
Triglycerides: 311 mg/dL — ABNORMAL HIGH (ref 0–149)
VLDL Cholesterol Cal: 62 mg/dL — ABNORMAL HIGH (ref 5–40)

## 2017-07-04 MED ORDER — EZETIMIBE 10 MG PO TABS: 10.0000 mg | ORAL_TABLET | Freq: Every day | ORAL | 3 refills | Status: DC

## 2017-07-07 ENCOUNTER — Ambulatory Visit (INDEPENDENT_AMBULATORY_CARE_PROVIDER_SITE_OTHER): Payer: Medicare Other | Admitting: Family Medicine

## 2017-07-07 ENCOUNTER — Encounter: Payer: Self-pay | Admitting: Family Medicine

## 2017-07-07 VITALS — BP 116/73 | HR 79 | Temp 97.1°F | Ht 64.5 in | Wt 146.6 lb

## 2017-07-07 DIAGNOSIS — N183 Chronic kidney disease, stage 3 unspecified: Secondary | ICD-10-CM

## 2017-07-07 DIAGNOSIS — R52 Pain, unspecified: Secondary | ICD-10-CM | POA: Diagnosis not present

## 2017-07-07 DIAGNOSIS — E785 Hyperlipidemia, unspecified: Secondary | ICD-10-CM

## 2017-07-07 NOTE — Progress Notes (Signed)
   HPI  Patient presents today here for follow-up of chronic medical conditions as well as body aches.  Patient has had a illness throughout the week described as intermittent body aches, fatigue, and began with vomiting and diarrhea for about 2 days.  She states that she seems to be getting better a little bit.  She is tolerating food and fluids, however her appetite is not back to normal.  Chronic kidney disease-she does not take any NSAIDs. Discussed labs today.  Hyperlipidemia Does not tolerate statins, has tried Lipitor and pravastatin. She will try Zetia.  PMH: Smoking status noted ROS: Per HPI  Objective: BP 116/73   Pulse 79   Temp (!) 97.1 F (36.2 C) (Oral)   Ht 5' 4.5" (1.638 m)   Wt 146 lb 9.6 oz (66.5 kg)   BMI 24.78 kg/m  Gen: NAD, alert, cooperative with exam HEENT: NCAT, oropharynx moist and clear, nares clear, TMs normal bilaterally CV: RRR, good S1/S2, no murmur Resp: CTABL, no wheezes, non-labored Ext: No edema, warm Neuro: Alert and oriented, No gross deficits  Assessment and plan:  #Body aches Likely viral illness, possibly fluids up. Patient is improving, continue fluids and supportive care.   #CKD stage III Discussed, avoid NSAIDs Repeat labs every 3-6 months  #Hyperlipidemia Repeat in labs recently, uncontrolled LDL that is worsening. Trial of Zetia, has tried pravastatin and Lipitor with intolerance.   Laroy Apple, MD [facility] 07/07/2017, 3:51 PM

## 2017-07-07 NOTE — Patient Instructions (Signed)
I have sent in Zetia for your cholesterol.   Lets see you guys back in 3 months or so.

## 2017-07-31 DIAGNOSIS — M9905 Segmental and somatic dysfunction of pelvic region: Secondary | ICD-10-CM | POA: Diagnosis not present

## 2017-07-31 DIAGNOSIS — M9903 Segmental and somatic dysfunction of lumbar region: Secondary | ICD-10-CM | POA: Diagnosis not present

## 2017-07-31 DIAGNOSIS — M9904 Segmental and somatic dysfunction of sacral region: Secondary | ICD-10-CM | POA: Diagnosis not present

## 2017-07-31 DIAGNOSIS — M5137 Other intervertebral disc degeneration, lumbosacral region: Secondary | ICD-10-CM | POA: Diagnosis not present

## 2017-08-31 DIAGNOSIS — Z1389 Encounter for screening for other disorder: Secondary | ICD-10-CM | POA: Diagnosis not present

## 2017-08-31 DIAGNOSIS — Z01419 Encounter for gynecological examination (general) (routine) without abnormal findings: Secondary | ICD-10-CM | POA: Diagnosis not present

## 2017-08-31 DIAGNOSIS — Z960 Presence of urogenital implants: Secondary | ICD-10-CM | POA: Diagnosis not present

## 2017-08-31 DIAGNOSIS — N952 Postmenopausal atrophic vaginitis: Secondary | ICD-10-CM | POA: Diagnosis not present

## 2017-10-09 ENCOUNTER — Encounter: Payer: Self-pay | Admitting: Family Medicine

## 2017-10-09 ENCOUNTER — Ambulatory Visit (INDEPENDENT_AMBULATORY_CARE_PROVIDER_SITE_OTHER): Payer: Medicare Other | Admitting: Family Medicine

## 2017-10-09 VITALS — BP 133/74 | HR 90 | Temp 97.6°F | Ht 64.5 in | Wt 153.4 lb

## 2017-10-09 DIAGNOSIS — E785 Hyperlipidemia, unspecified: Secondary | ICD-10-CM | POA: Diagnosis not present

## 2017-10-09 NOTE — Patient Instructions (Signed)
Great to see you!  Come back in 4 months unless you need us sooner.    

## 2017-10-09 NOTE — Progress Notes (Signed)
   HPI  Patient presents today here for follow-up hyperlipidemia.  Patient previously failed pravastatin and atorvastatin due to muscle pains and aches.  She is tolerating Zetia well. She states that she has had good medication compliance and tolerance. She is watching her diet moderately.  No Hx of MI or CVA  PMH: Smoking status noted ROS: Per HPI  Objective: BP 133/74   Pulse 90   Temp 97.6 F (36.4 C) (Oral)   Ht 5' 4.5" (1.638 m)   Wt 153 lb 6.4 oz (69.6 kg)   BMI 25.92 kg/m  Gen: NAD, alert, cooperative with exam HEENT: NCAT CV: RRR, good S1/S2, no murmur Resp: CTABL, no wheezes, non-labored Ext: No edema, warm Neuro: Alert and oriented, No gross deficits  Assessment and plan:  # HLD Tolerating zetia well.  Planning to add livalo if needed RTC in 4 months   Orders Placed This Encounter  Procedures  . CMP14+EGFR  . LDL Cholesterol, Direct    Laroy Apple, MD [facility] 10/09/2017, 3:29 PM

## 2017-10-10 LAB — LDL CHOLESTEROL, DIRECT: LDL Direct: 125 mg/dL — ABNORMAL HIGH (ref 0–99)

## 2017-10-10 LAB — CMP14+EGFR
ALT: 16 IU/L (ref 0–32)
AST: 12 IU/L (ref 0–40)
Albumin/Globulin Ratio: 1.1 — ABNORMAL LOW (ref 1.2–2.2)
Albumin: 3.7 g/dL (ref 3.5–4.8)
Alkaline Phosphatase: 84 IU/L (ref 39–117)
BUN/Creatinine Ratio: 38 — ABNORMAL HIGH (ref 12–28)
BUN: 30 mg/dL — ABNORMAL HIGH (ref 8–27)
Bilirubin Total: 0.2 mg/dL (ref 0.0–1.2)
CO2: 19 mmol/L — ABNORMAL LOW (ref 20–29)
Calcium: 9.2 mg/dL (ref 8.7–10.3)
Chloride: 102 mmol/L (ref 96–106)
Creatinine, Ser: 0.8 mg/dL (ref 0.57–1.00)
GFR calc Af Amer: 83 mL/min/{1.73_m2} (ref >59)
GFR calc non Af Amer: 72 mL/min/{1.73_m2} (ref >59)
Globulin, Total: 3.3 g/dL (ref 1.5–4.5)
Glucose: 120 mg/dL — ABNORMAL HIGH (ref 65–99)
Potassium: 4.2 mmol/L (ref 3.5–5.2)
Sodium: 141 mmol/L (ref 134–144)
Total Protein: 7 g/dL (ref 6.0–8.5)

## 2017-11-07 ENCOUNTER — Other Ambulatory Visit: Payer: Self-pay | Admitting: Family Medicine

## 2017-11-30 DIAGNOSIS — M9904 Segmental and somatic dysfunction of sacral region: Secondary | ICD-10-CM | POA: Diagnosis not present

## 2017-11-30 DIAGNOSIS — M9903 Segmental and somatic dysfunction of lumbar region: Secondary | ICD-10-CM | POA: Diagnosis not present

## 2017-11-30 DIAGNOSIS — M5137 Other intervertebral disc degeneration, lumbosacral region: Secondary | ICD-10-CM | POA: Diagnosis not present

## 2017-11-30 DIAGNOSIS — M9905 Segmental and somatic dysfunction of pelvic region: Secondary | ICD-10-CM | POA: Diagnosis not present

## 2017-12-05 DIAGNOSIS — M9905 Segmental and somatic dysfunction of pelvic region: Secondary | ICD-10-CM | POA: Diagnosis not present

## 2017-12-05 DIAGNOSIS — M5137 Other intervertebral disc degeneration, lumbosacral region: Secondary | ICD-10-CM | POA: Diagnosis not present

## 2017-12-05 DIAGNOSIS — M9904 Segmental and somatic dysfunction of sacral region: Secondary | ICD-10-CM | POA: Diagnosis not present

## 2017-12-05 DIAGNOSIS — M9903 Segmental and somatic dysfunction of lumbar region: Secondary | ICD-10-CM | POA: Diagnosis not present

## 2017-12-11 DIAGNOSIS — R102 Pelvic and perineal pain: Secondary | ICD-10-CM | POA: Diagnosis not present

## 2017-12-11 DIAGNOSIS — M5137 Other intervertebral disc degeneration, lumbosacral region: Secondary | ICD-10-CM | POA: Diagnosis not present

## 2017-12-11 DIAGNOSIS — M9903 Segmental and somatic dysfunction of lumbar region: Secondary | ICD-10-CM | POA: Diagnosis not present

## 2017-12-11 DIAGNOSIS — M9904 Segmental and somatic dysfunction of sacral region: Secondary | ICD-10-CM | POA: Diagnosis not present

## 2017-12-11 DIAGNOSIS — M9905 Segmental and somatic dysfunction of pelvic region: Secondary | ICD-10-CM | POA: Diagnosis not present

## 2017-12-11 DIAGNOSIS — Z960 Presence of urogenital implants: Secondary | ICD-10-CM | POA: Diagnosis not present

## 2017-12-11 DIAGNOSIS — N8189 Other female genital prolapse: Secondary | ICD-10-CM | POA: Diagnosis not present

## 2018-01-15 ENCOUNTER — Ambulatory Visit (INDEPENDENT_AMBULATORY_CARE_PROVIDER_SITE_OTHER): Payer: Medicare Other | Admitting: *Deleted

## 2018-01-15 ENCOUNTER — Encounter: Payer: Self-pay | Admitting: *Deleted

## 2018-01-15 VITALS — BP 150/83 | HR 76 | Ht 64.5 in | Wt 150.0 lb

## 2018-01-15 DIAGNOSIS — Z0000 Encounter for general adult medical examination without abnormal findings: Secondary | ICD-10-CM

## 2018-01-15 NOTE — Progress Notes (Addendum)
Subjective:   Erin Khan is a 77 y.o. female who presents for an Initial Medicare Annual Wellness Visit. Erin Khan is married and lives at home with her husband who accompanies her today. She has one adult son and one adult daughter. She has 8 grandchildren and 5 great grandchildren.   Review of Systems    Overall health is about the same as last year.   Cardiac Risk Factors include: advanced age (>39men, >35 women);dyslipidemia;hypertension     Objective:    Today's Vitals   01/15/18 1427 01/15/18 1519  BP: (!) 156/84 (!) 150/83  Pulse: 82 76  Weight: 150 lb (68 kg)   Height: 5' 4.5" (1.638 m)    Body mass index is 25.35 kg/m.  Advanced Directives 01/15/2018 12/08/2016 05/15/2013  Does Patient Have a Medical Advance Directive? No No Patient does not have advance directive;Patient would not like information  Would patient like information on creating a medical advance directive? Yes (MAU/Ambulatory/Procedural Areas - Information given) Yes (MAU/Ambulatory/Procedural Areas - Information given) -  Pre-existing out of facility DNR order (yellow form or pink MOST form) - - No    Current Medications (verified) Outpatient Encounter Medications as of 01/15/2018  Medication Sig  . calcium carbonate (OS-CAL) 600 MG TABS tablet Take 600 mg by mouth daily.  . cetirizine (ZYRTEC) 10 MG tablet Take 1 tablet (10 mg total) by mouth daily.  Marland Kitchen ezetimibe (ZETIA) 10 MG tablet Take 1 tablet by mouth daily.  Marland Kitchen lisinopril-hydrochlorothiazide (PRINZIDE,ZESTORETIC) 20-25 MG tablet TAKE 1 TABLET BY MOUTH  DAILY  . vitamin B-12 (CYANOCOBALAMIN) 100 MCG tablet Vitamin B12   No facility-administered encounter medications on file as of 01/15/2018.     Allergies (verified) Asa [aspirin]   History: Past Medical History:  Diagnosis Date  . Cataract   . Colon polyps   . Hypercholesteremia   . Hypertension    Past Surgical History:  Procedure Laterality Date  . ABDOMINAL HYSTERECTOMY    .  BILATERAL OOPHORECTOMY    . CATARACT EXTRACTION, BILATERAL  2014  . COLONOSCOPY    . COLONOSCOPY N/A 05/15/2013   Procedure: COLONOSCOPY;  Surgeon: Rogene Houston, MD;  Location: [facility];  Service: Endoscopy;  Laterality: N/A;  930  . cranial shunt     17 years ago   Family History  Problem Relation Age of Onset  . Lung cancer Sister   . Hypertension Father   . Heart attack Brother   . Arthritis Sister   . Asthma Brother   . Healthy Daughter   . Healthy Son   . Colon cancer Neg Hx    Social History   Socioeconomic History  . Marital status: Married    Spouse name: Not on file  . Number of children: 2  . Years of education: 50  . Highest education level: 12th grade  Occupational History  . Occupation: Retired    Comment: Environmental health practitioner  . Occupation: [employer]: [employer]    Comment: 20 to 25 hours a week part time  Social Needs  . Financial resource strain: Not hard at all  . Food insecurity:    Worry: Never true    Inability: Never true  . Transportation needs:    Medical: No    Non-medical: No  Tobacco Use  . Smoking status: Never Smoker  . Smokeless tobacco: Never Used  Substance and Sexual Activity  . Alcohol use: No  . Drug use: No  . Sexual  activity: Yes  Lifestyle  . Physical activity:    Days per week: 3 days    Minutes per session: 90 min  . Stress: Not at all  Relationships  . Social connections:    Talks on phone: More than three times a week    Gets together: More than three times a week    Attends religious service: More than 4 times per year    Active member of club or organization: Yes    Attends meetings of clubs or organizations: More than 4 times per year    Relationship status: Married  Other Topics Concern  . Not on file  Social History Narrative  . Not on file    Tobacco Counseling No tobacco use  Clinical Intake:    Pain : No/denies pain    Nutritional Status: BMI 25 -29 Overweight Diabetes: No  How  often do you need to have someone help you when you read instructions, pamphlets, or other written materials from your doctor or pharmacy?: 1 - Never What is the last grade level you completed in school?: 12  Interpreter Needed?: No  Information entered by :: Chong Sicilian, RN   Activities of Daily Living In your present state of health, do you have any difficulty performing the following activities: 01/15/2018  Hearing? N  Vision? N  Comment restricted to glasses for driving. Has exame every two years.   Difficulty concentrating or making decisions? N  Walking or climbing stairs? N  Dressing or bathing? N  Doing errands, shopping? N  Preparing Food and eating ? N  Using the Toilet? N  In the past six months, have you accidently leaked urine? N  Do you have problems with loss of bowel control? N  Managing your Medications? N  Managing your Finances? N  Housekeeping or managing your Housekeeping? N  Some recent data might be hidden     Immunizations and Health Maintenance Immunization History  Administered Date(s) Administered  . Influenza, High Dose Seasonal PF 05/30/2017  . Influenza,inj,Quad PF,6+ Mos 05/28/2016  . Pneumococcal Conjugate-13 07/25/2016  . Pneumococcal Polysaccharide-23 03/22/2016   There are no preventive care reminders to display for this patient.  Patient Care Team: Timmothy Euler, MD as PCP - General (Family Medicine) Paula Compton, MD as Consulting Physician (Obstetrics and Gynecology) Rogene Houston, MD as Consulting Physician (Gastroenterology)  No hospitalizations, ER visits, or surgeries this past year.     Assessment:   This is a routine wellness examination for Erin Khan.  Hearing/Vision screen No exam data present  Dietary issues and exercise activities discussed:  Diet Sips on water throughout the day while at work Cup of coffee in the morning and has a sprite during the day Cereal for breakfast, sandwich at lunch and then eats  out for supper  Current Exercise Habits: The patient does not participate in regular exercise at present(does a lot of walking and lifting at work and works around her home when off), Exercise limited by: None identified  Goals    . Exercise 150 min/wk Moderate Activity      Depression Screen PHQ 2/9 Scores 01/15/2018 10/09/2017 07/07/2017 06/06/2017 05/30/2017 01/27/2017 01/13/2017  PHQ - 2 Score 0 0 0 0 0 0 0    Fall Risk Fall Risk  01/15/2018 10/09/2017 07/07/2017 06/06/2017 05/30/2017  Falls in the past year? No No Yes No No  Number falls in past yr: - - 1 - -  Injury with Fall? - - No - -  Is the patient's home free of loose throw rugs in walkways, pet beds, electrical cords, etc?   yes      Grab bars in the bathroom? no      Handrails on the stairs?   yes      Adequate lighting?   yes   Cognitive Function: MMSE - Mini Mental State Exam 01/15/2018 12/08/2016  Orientation to time 5 5  Orientation to Place 5 5  Registration 3 3  Attention/ Calculation 5 5  Recall 2 3  Language- name 2 objects 2 2  Language- repeat 1 1  Language- follow 3 step command 3 3  Language- read & follow direction 1 1  Write a sentence 1 1  Copy design 1 1  Total score 29 30        Screening Tests Health Maintenance  Topic Date Due  . TETANUS/TDAP  01/16/2019 (Originally 05/13/1960)  . INFLUENZA VACCINE  03/29/2018  . DEXA SCAN  12/09/2018  . COLONOSCOPY  05/16/2023  . PNA vac Low Risk Adult  Completed     Cancer Screenings: Lung: Low Dose CT Chest recommended if Age 70-80 years, 30 pack-year currently smoking OR have quit w/in 15years. Patient does not qualify. Breast: Up to date on Mammogram? Not indicated Up to date of Bone Density/Dexa? Yes Colorectal: due 04/2018 with Dr Laural Golden. She has not received a reminder so I provided her with his schedulers contact information.    Plan:  Schedule colonoscopy for after 04/2018 Keep f/u with pcp Increase activity level. Aim for at least 150 minutes  a week Advance Directives given and discussed  I have personally reviewed and noted the following in the patient's chart:   . Medical and social history . Use of alcohol, tobacco or illicit drugs  . Current medications and supplements . Functional ability and status . Nutritional status . Physical activity . Advanced directives . List of other physicians . Hospitalizations, surgeries, and ER visits in previous 12 months . Vitals . Screenings to include cognitive, depression, and falls . Referrals and appointments  In addition, I have reviewed and discussed with patient certain preventive protocols, quality metrics, and best practice recommendations. A written personalized care plan for preventive services as well as general preventive health recommendations were provided to patient.     Ilean China, RN   01/15/2018    I have reviewed and agree with the above AWV documentation.   Laroy Apple, MD [facility] 01/16/2018, 12:38 PM

## 2018-01-15 NOTE — Patient Instructions (Signed)
Erin Khan , Thank you for taking time to come for your Medicare Wellness Visit. I appreciate your ongoing commitment to your health goals. Please review the following plan we discussed and let me know if I can assist you in the future.   Contact Dr Olevia Perches office to schedule colonoscopy in Sept 2019.  [phone]-Scheduler is Lacretia Nicks  These are the goals we discussed: Goals    . Exercise 150 min/wk Moderate Activity       This is a list of the screening recommended for you and due dates:  Health Maintenance  Topic Date Due  . Tetanus Vaccine  01/16/2019*  . Flu Shot  03/29/2018  . DEXA scan (bone density measurement)  12/09/2018  . Colon Cancer Screening  05/16/2023  . Pneumonia vaccines  Completed  *Topic was postponed. The date shown is not the original due date.   Health Maintenance, Female Adopting a healthy lifestyle and getting preventive care can go a long way to promote health and wellness. Talk with your health care provider about what schedule of regular examinations is right for you. This is a good chance for you to check in with your provider about disease prevention and staying healthy. In between checkups, there are plenty of things you can do on your own. Experts have done a lot of research about which lifestyle changes and preventive measures are most likely to keep you healthy. Ask your health care provider for more information. Weight and diet Eat a healthy diet  Be sure to include plenty of vegetables, fruits, low-fat dairy products, and lean protein.  Do not eat a lot of foods high in solid fats, added sugars, or salt.  Get regular exercise. This is one of the most important things you can do for your health. ? Most adults should exercise for at least 150 minutes each week. The exercise should increase your heart rate and make you sweat (moderate-intensity exercise). ? Most adults should also do strengthening exercises at least twice a week. This is in  addition to the moderate-intensity exercise.  Maintain a healthy weight  Body mass index (BMI) is a measurement that can be used to identify possible weight problems. It estimates body fat based on height and weight. Your health care provider can help determine your BMI and help you achieve or maintain a healthy weight.  For females 41 years of age and older: ? A BMI below 18.5 is considered underweight. ? A BMI of 18.5 to 24.9 is normal. ? A BMI of 25 to 29.9 is considered overweight. ? A BMI of 30 and above is considered obese.  Watch levels of cholesterol and blood lipids  You should start having your blood tested for lipids and cholesterol at 76 years of age, then have this test every 5 years.  You may need to have your cholesterol levels checked more often if: ? Your lipid or cholesterol levels are high. ? You are older than 76 years of age. ? You are at high risk for heart disease.  Cancer screening Lung Cancer  Lung cancer screening is recommended for adults 108-57 years old who are at high risk for lung cancer because of a history of smoking.  A yearly low-dose CT scan of the lungs is recommended for people who: ? Currently smoke. ? Have quit within the past 15 years. ? Have at least a 30-pack-year history of smoking. A pack year is smoking an average of one pack of cigarettes a day for 1  year.  Yearly screening should continue until it has been 15 years since you quit.  Yearly screening should stop if you develop a health problem that would prevent you from having lung cancer treatment.  Breast Cancer  Practice breast self-awareness. This means understanding how your breasts normally appear and feel.  It also means doing regular breast self-exams. Let your health care provider know about any changes, no matter how small.  If you are in your 20s or 30s, you should have a clinical breast exam (CBE) by a health care provider every 1-3 years as part of a regular health  exam.  If you are 53 or older, have a CBE every year. Also consider having a breast X-ray (mammogram) every year.  If you have a family history of breast cancer, talk to your health care provider about genetic screening.  If you are at high risk for breast cancer, talk to your health care provider about having an MRI and a mammogram every year.  Breast cancer gene (BRCA) assessment is recommended for women who have family members with BRCA-related cancers. BRCA-related cancers include: ? Breast. ? Ovarian. ? Tubal. ? Peritoneal cancers.  Results of the assessment will determine the need for genetic counseling and BRCA1 and BRCA2 testing.  Cervical Cancer Your health care provider may recommend that you be screened regularly for cancer of the pelvic organs (ovaries, uterus, and vagina). This screening involves a pelvic examination, including checking for microscopic changes to the surface of your cervix (Pap test). You may be encouraged to have this screening done every 3 years, beginning at age 23.  For women ages 48-65, health care providers may recommend pelvic exams and Pap testing every 3 years, or they may recommend the Pap and pelvic exam, combined with testing for human papilloma virus (HPV), every 5 years. Some types of HPV increase your risk of cervical cancer. Testing for HPV may also be done on women of any age with unclear Pap test results.  Other health care providers may not recommend any screening for nonpregnant women who are considered low risk for pelvic cancer and who do not have symptoms. Ask your health care provider if a screening pelvic exam is right for you.  If you have had past treatment for cervical cancer or a condition that could lead to cancer, you need Pap tests and screening for cancer for at least 20 years after your treatment. If Pap tests have been discontinued, your risk factors (such as having a new sexual partner) need to be reassessed to determine if  screening should resume. Some women have medical problems that increase the chance of getting cervical cancer. In these cases, your health care provider may recommend more frequent screening and Pap tests.  Colorectal Cancer  This type of cancer can be detected and often prevented.  Routine colorectal cancer screening usually begins at 76 years of age and continues through 76 years of age.  Your health care provider may recommend screening at an earlier age if you have risk factors for colon cancer.  Your health care provider may also recommend using home test kits to check for hidden blood in the stool.  A small camera at the end of a tube can be used to examine your colon directly (sigmoidoscopy or colonoscopy). This is done to check for the earliest forms of colorectal cancer.  Routine screening usually begins at age 47.  Direct examination of the colon should be repeated every 5-10 years through 75 years  of age. However, you may need to be screened more often if early forms of precancerous polyps or small growths are found.  Skin Cancer  Check your skin from head to toe regularly.  Tell your health care provider about any new moles or changes in moles, especially if there is a change in a mole's shape or color.  Also tell your health care provider if you have a mole that is larger than the size of a pencil eraser.  Always use sunscreen. Apply sunscreen liberally and repeatedly throughout the day.  Protect yourself by wearing long sleeves, pants, a wide-brimmed hat, and sunglasses whenever you are outside.  Heart disease, diabetes, and high blood pressure  High blood pressure causes heart disease and increases the risk of stroke. High blood pressure is more likely to develop in: ? People who have blood pressure in the high end of the normal range (130-139/85-89 mm Hg). ? People who are overweight or obese. ? People who are African American.  If you are 33-38 years of age, have  your blood pressure checked every 3-5 years. If you are 6 years of age or older, have your blood pressure checked every year. You should have your blood pressure measured twice-once when you are at a hospital or clinic, and once when you are not at a hospital or clinic. Record the average of the two measurements. To check your blood pressure when you are not at a hospital or clinic, you can use: ? An automated blood pressure machine at a pharmacy. ? A home blood pressure monitor.  If you are between 75 years and 20 years old, ask your health care provider if you should take aspirin to prevent strokes.  Have regular diabetes screenings. This involves taking a blood sample to check your fasting blood sugar level. ? If you are at a normal weight and have a low risk for diabetes, have this test once every three years after 76 years of age. ? If you are overweight and have a high risk for diabetes, consider being tested at a younger age or more often. Preventing infection Hepatitis B  If you have a higher risk for hepatitis B, you should be screened for this virus. You are considered at high risk for hepatitis B if: ? You were born in a country where hepatitis B is common. Ask your health care provider which countries are considered high risk. ? Your parents were born in a high-risk country, and you have not been immunized against hepatitis B (hepatitis B vaccine). ? You have HIV or AIDS. ? You use needles to inject street drugs. ? You live with someone who has hepatitis B. ? You have had sex with someone who has hepatitis B. ? You get hemodialysis treatment. ? You take certain medicines for conditions, including cancer, organ transplantation, and autoimmune conditions.  Hepatitis C  Blood testing is recommended for: ? Everyone born from [birth year] through 1964. ? Anyone with known risk factors for hepatitis C.  Sexually transmitted infections (STIs)  You should be screened for sexually  transmitted infections (STIs) including gonorrhea and chlamydia if: ? You are sexually active and are younger than 76 years of age. ? You are older than 76 years of age and your health care provider tells you that you are at risk for this type of infection. ? Your sexual activity has changed since you were last screened and you are at an increased risk for chlamydia or gonorrhea. Ask your health care  provider if you are at risk.  If you do not have HIV, but are at risk, it may be recommended that you take a prescription medicine daily to prevent HIV infection. This is called pre-exposure prophylaxis (PrEP). You are considered at risk if: ? You are sexually active and do not regularly use condoms or know the HIV status of your partner(s). ? You take drugs by injection. ? You are sexually active with a partner who has HIV.  Talk with your health care provider about whether you are at high risk of being infected with HIV. If you choose to begin PrEP, you should first be tested for HIV. You should then be tested every 3 months for as long as you are taking PrEP. Pregnancy  If you are premenopausal and you may become pregnant, ask your health care provider about preconception counseling.  If you may become pregnant, take 400 to 800 micrograms (mcg) of folic acid every day.  If you want to prevent pregnancy, talk to your health care provider about birth control (contraception). Osteoporosis and menopause  Osteoporosis is a disease in which the bones lose minerals and strength with aging. This can result in serious bone fractures. Your risk for osteoporosis can be identified using a bone density scan.  If you are 18 years of age or older, or if you are at risk for osteoporosis and fractures, ask your health care provider if you should be screened.  Ask your health care provider whether you should take a calcium or vitamin D supplement to lower your risk for osteoporosis.  Menopause may have  certain physical symptoms and risks.  Hormone replacement therapy may reduce some of these symptoms and risks. Talk to your health care provider about whether hormone replacement therapy is right for you. Follow these instructions at home:  Schedule regular health, dental, and eye exams.  Stay current with your immunizations.  Do not use any tobacco products including cigarettes, chewing tobacco, or electronic cigarettes.  If you are pregnant, do not drink alcohol.  If you are breastfeeding, limit how much and how often you drink alcohol.  Limit alcohol intake to no more than 1 drink per day for nonpregnant women. One drink equals 12 ounces of beer, 5 ounces of wine, or 1 ounces of hard liquor.  Do not use street drugs.  Do not share needles.  Ask your health care provider for help if you need support or information about quitting drugs.  Tell your health care provider if you often feel depressed.  Tell your health care provider if you have ever been abused or do not feel safe at home. This information is not intended to replace advice given to you by your health care provider. Make sure you discuss any questions you have with your health care provider. Document Released: 02/28/2011 Document Revised: 01/21/2016 Document Reviewed: 05/19/2015 Elsevier Interactive Patient Education  [employer].

## 2018-01-18 DIAGNOSIS — M9905 Segmental and somatic dysfunction of pelvic region: Secondary | ICD-10-CM | POA: Diagnosis not present

## 2018-01-18 DIAGNOSIS — M5137 Other intervertebral disc degeneration, lumbosacral region: Secondary | ICD-10-CM | POA: Diagnosis not present

## 2018-01-18 DIAGNOSIS — M9903 Segmental and somatic dysfunction of lumbar region: Secondary | ICD-10-CM | POA: Diagnosis not present

## 2018-01-18 DIAGNOSIS — M9904 Segmental and somatic dysfunction of sacral region: Secondary | ICD-10-CM | POA: Diagnosis not present

## 2018-01-23 DIAGNOSIS — M9903 Segmental and somatic dysfunction of lumbar region: Secondary | ICD-10-CM | POA: Diagnosis not present

## 2018-01-23 DIAGNOSIS — M5137 Other intervertebral disc degeneration, lumbosacral region: Secondary | ICD-10-CM | POA: Diagnosis not present

## 2018-01-23 DIAGNOSIS — M9905 Segmental and somatic dysfunction of pelvic region: Secondary | ICD-10-CM | POA: Diagnosis not present

## 2018-01-23 DIAGNOSIS — M9904 Segmental and somatic dysfunction of sacral region: Secondary | ICD-10-CM | POA: Diagnosis not present

## 2018-01-29 DIAGNOSIS — M9903 Segmental and somatic dysfunction of lumbar region: Secondary | ICD-10-CM | POA: Diagnosis not present

## 2018-01-29 DIAGNOSIS — M5137 Other intervertebral disc degeneration, lumbosacral region: Secondary | ICD-10-CM | POA: Diagnosis not present

## 2018-01-29 DIAGNOSIS — M9905 Segmental and somatic dysfunction of pelvic region: Secondary | ICD-10-CM | POA: Diagnosis not present

## 2018-01-29 DIAGNOSIS — M9904 Segmental and somatic dysfunction of sacral region: Secondary | ICD-10-CM | POA: Diagnosis not present

## 2018-01-31 DIAGNOSIS — M9905 Segmental and somatic dysfunction of pelvic region: Secondary | ICD-10-CM | POA: Diagnosis not present

## 2018-01-31 DIAGNOSIS — M5137 Other intervertebral disc degeneration, lumbosacral region: Secondary | ICD-10-CM | POA: Diagnosis not present

## 2018-01-31 DIAGNOSIS — M9904 Segmental and somatic dysfunction of sacral region: Secondary | ICD-10-CM | POA: Diagnosis not present

## 2018-01-31 DIAGNOSIS — M9903 Segmental and somatic dysfunction of lumbar region: Secondary | ICD-10-CM | POA: Diagnosis not present

## 2018-02-06 ENCOUNTER — Ambulatory Visit (INDEPENDENT_AMBULATORY_CARE_PROVIDER_SITE_OTHER): Payer: Medicare Other | Admitting: Family Medicine

## 2018-02-06 ENCOUNTER — Encounter: Payer: Self-pay | Admitting: Family Medicine

## 2018-02-06 VITALS — BP 132/76 | HR 80 | Temp 97.9°F | Ht 64.5 in | Wt 150.2 lb

## 2018-02-06 DIAGNOSIS — M810 Age-related osteoporosis without current pathological fracture: Secondary | ICD-10-CM | POA: Diagnosis not present

## 2018-02-06 DIAGNOSIS — I10 Essential (primary) hypertension: Secondary | ICD-10-CM | POA: Diagnosis not present

## 2018-02-06 DIAGNOSIS — E785 Hyperlipidemia, unspecified: Secondary | ICD-10-CM | POA: Diagnosis not present

## 2018-02-06 NOTE — Addendum Note (Signed)
Addended by: Timmothy Euler on: 02/06/2018 03:20 PM   Modules accepted: Orders

## 2018-02-06 NOTE — Progress Notes (Signed)
   HPI  Patient presents today for follow-up chronic medical conditions.  Osteoporosis Not using medication, she is open to the idea.  Hypertension Good medication compliance and tolerance. No headache or chest pain Not checking blood pressure at home.  Hyperlipidemia Tolerating ezetimibe well, good medication compliance.  PMH: Smoking status noted ROS: Per HPI  Objective: BP 132/76   Pulse 80   Temp 97.9 F (36.6 C) (Oral)   Ht 5' 4.5" (1.638 m)   Wt 150 lb 3.2 oz (68.1 kg)   BMI 25.38 kg/m  Gen: NAD, alert, cooperative with exam HEENT: NCAT CV: RRR, good S1/S2, no murmur Resp: CTABL, no wheezes, non-labored Ext: No edema, warm Neuro: Alert and oriented, No gross deficits  Assessment and plan:  #Osteoporosis T score was -2.7 in April 2018, consider Prolia  #Hypertension Well-controlled Labs up-to-date No changes  #Hyperlipidemia LDL moderately controlled at 125, continue [place], MD [facility] 02/06/2018, 3:07 PM

## 2018-02-06 NOTE — Patient Instructions (Signed)
Great to see you!  Consider Prolia for osteoporosis.

## 2018-02-07 LAB — CMP14+EGFR
ALT: 9 IU/L (ref 0–32)
AST: 8 IU/L (ref 0–40)
Albumin/Globulin Ratio: 1.7 (ref 1.2–2.2)
Albumin: 4.3 g/dL (ref 3.5–4.8)
Alkaline Phosphatase: 84 IU/L (ref 39–117)
BUN/Creatinine Ratio: 25 (ref 12–28)
BUN: 39 mg/dL — ABNORMAL HIGH (ref 8–27)
Bilirubin Total: 0.2 mg/dL (ref 0.0–1.2)
CO2: 21 mmol/L (ref 20–29)
Calcium: 9.3 mg/dL (ref 8.7–10.3)
Chloride: 104 mmol/L (ref 96–106)
Creatinine, Ser: 1.59 mg/dL — ABNORMAL HIGH (ref 0.57–1.00)
GFR calc Af Amer: 36 mL/min/{1.73_m2} — ABNORMAL LOW (ref >59)
GFR calc non Af Amer: 32 mL/min/{1.73_m2} — ABNORMAL LOW (ref >59)
Globulin, Total: 2.5 g/dL (ref 1.5–4.5)
Glucose: 94 mg/dL (ref 65–99)
Potassium: 4.2 mmol/L (ref 3.5–5.2)
Sodium: 142 mmol/L (ref 134–144)
Total Protein: 6.8 g/dL (ref 6.0–8.5)

## 2018-02-07 LAB — VITAMIN D 25 HYDROXY (VIT D DEFICIENCY, FRACTURES): Vit D, 25-Hydroxy: 29.1 ng/mL — ABNORMAL LOW (ref 30.0–100.0)

## 2018-02-15 DIAGNOSIS — M9904 Segmental and somatic dysfunction of sacral region: Secondary | ICD-10-CM | POA: Diagnosis not present

## 2018-02-15 DIAGNOSIS — M9903 Segmental and somatic dysfunction of lumbar region: Secondary | ICD-10-CM | POA: Diagnosis not present

## 2018-02-15 DIAGNOSIS — M9905 Segmental and somatic dysfunction of pelvic region: Secondary | ICD-10-CM | POA: Diagnosis not present

## 2018-02-15 DIAGNOSIS — M5137 Other intervertebral disc degeneration, lumbosacral region: Secondary | ICD-10-CM | POA: Diagnosis not present

## 2018-03-08 DIAGNOSIS — M5137 Other intervertebral disc degeneration, lumbosacral region: Secondary | ICD-10-CM | POA: Diagnosis not present

## 2018-03-08 DIAGNOSIS — M9903 Segmental and somatic dysfunction of lumbar region: Secondary | ICD-10-CM | POA: Diagnosis not present

## 2018-03-08 DIAGNOSIS — M9905 Segmental and somatic dysfunction of pelvic region: Secondary | ICD-10-CM | POA: Diagnosis not present

## 2018-03-08 DIAGNOSIS — M9904 Segmental and somatic dysfunction of sacral region: Secondary | ICD-10-CM | POA: Diagnosis not present

## 2018-03-16 ENCOUNTER — Telehealth: Payer: Self-pay | Admitting: *Deleted

## 2018-03-16 NOTE — Telephone Encounter (Signed)
Insurance verification resubmitted to [employer]. Expect a response in 3 to 5 business days.

## 2018-04-03 DIAGNOSIS — N8189 Other female genital prolapse: Secondary | ICD-10-CM | POA: Diagnosis not present

## 2018-04-03 DIAGNOSIS — N819 Female genital prolapse, unspecified: Secondary | ICD-10-CM | POA: Diagnosis not present

## 2018-04-03 DIAGNOSIS — Z960 Presence of urogenital implants: Secondary | ICD-10-CM | POA: Diagnosis not present

## 2018-04-12 DIAGNOSIS — M9904 Segmental and somatic dysfunction of sacral region: Secondary | ICD-10-CM | POA: Diagnosis not present

## 2018-04-12 DIAGNOSIS — M9903 Segmental and somatic dysfunction of lumbar region: Secondary | ICD-10-CM | POA: Diagnosis not present

## 2018-04-12 DIAGNOSIS — M5137 Other intervertebral disc degeneration, lumbosacral region: Secondary | ICD-10-CM | POA: Diagnosis not present

## 2018-04-12 DIAGNOSIS — M9905 Segmental and somatic dysfunction of pelvic region: Secondary | ICD-10-CM | POA: Diagnosis not present

## 2018-04-25 ENCOUNTER — Other Ambulatory Visit (HOSPITAL_COMMUNITY): Payer: Self-pay | Admitting: Obstetrics and Gynecology

## 2018-04-25 DIAGNOSIS — Z1231 Encounter for screening mammogram for malignant neoplasm of breast: Secondary | ICD-10-CM

## 2018-05-02 DIAGNOSIS — D0439 Carcinoma in situ of skin of other parts of face: Secondary | ICD-10-CM | POA: Diagnosis not present

## 2018-05-02 DIAGNOSIS — L570 Actinic keratosis: Secondary | ICD-10-CM | POA: Diagnosis not present

## 2018-05-02 DIAGNOSIS — L820 Inflamed seborrheic keratosis: Secondary | ICD-10-CM | POA: Diagnosis not present

## 2018-05-02 DIAGNOSIS — L720 Epidermal cyst: Secondary | ICD-10-CM | POA: Diagnosis not present

## 2018-05-02 DIAGNOSIS — X32XXXD Exposure to sunlight, subsequent encounter: Secondary | ICD-10-CM | POA: Diagnosis not present

## 2018-05-04 ENCOUNTER — Encounter (INDEPENDENT_AMBULATORY_CARE_PROVIDER_SITE_OTHER): Payer: Self-pay | Admitting: *Deleted

## 2018-05-16 DIAGNOSIS — L723 Sebaceous cyst: Secondary | ICD-10-CM | POA: Diagnosis not present

## 2018-05-17 DIAGNOSIS — M5137 Other intervertebral disc degeneration, lumbosacral region: Secondary | ICD-10-CM | POA: Diagnosis not present

## 2018-05-17 DIAGNOSIS — M9904 Segmental and somatic dysfunction of sacral region: Secondary | ICD-10-CM | POA: Diagnosis not present

## 2018-05-17 DIAGNOSIS — M9903 Segmental and somatic dysfunction of lumbar region: Secondary | ICD-10-CM | POA: Diagnosis not present

## 2018-05-17 DIAGNOSIS — M9905 Segmental and somatic dysfunction of pelvic region: Secondary | ICD-10-CM | POA: Diagnosis not present

## 2018-05-23 ENCOUNTER — Telehealth: Payer: Self-pay | Admitting: *Deleted

## 2018-05-23 NOTE — Telephone Encounter (Signed)
Spoke with patient about summary of benefits and coverage. She would owe 20% with a possible $35 administration fee.   May qualify for [facility]. Will mail information on the grant and Prolia. Patient will bring the grant information back to me to send in and will call if she has any questions.

## 2018-05-24 ENCOUNTER — Encounter: Payer: Self-pay | Admitting: Family Medicine

## 2018-05-24 ENCOUNTER — Ambulatory Visit (INDEPENDENT_AMBULATORY_CARE_PROVIDER_SITE_OTHER): Payer: Medicare Other | Admitting: Family Medicine

## 2018-05-24 VITALS — BP 155/82 | HR 83 | Temp 97.6°F | Ht 64.5 in | Wt 150.0 lb

## 2018-05-24 DIAGNOSIS — J069 Acute upper respiratory infection, unspecified: Secondary | ICD-10-CM | POA: Diagnosis not present

## 2018-05-24 DIAGNOSIS — R059 Cough, unspecified: Secondary | ICD-10-CM

## 2018-05-24 DIAGNOSIS — R05 Cough: Secondary | ICD-10-CM

## 2018-05-24 MED ORDER — BENZONATATE 100 MG PO CAPS: 100.0000 mg | ORAL_CAPSULE | Freq: Three times a day (TID) | ORAL | 0 refills | Status: DC | PRN

## 2018-05-24 NOTE — Progress Notes (Signed)
Subjective:    Patient ID: Erin Khan, female    DOB: July 03, 1941, 77 y.o.   MRN: [id number]  Chief Complaint:  Cough, low grade fever, chest congestion (since yesterday)   HPI: Erin Khan is a 77 y.o. female presenting on 05/24/2018 for Cough, low grade fever, chest congestion (since yesterday)  Pt presents today with a cough and upper respiratory congestion that stated yesterday. She did not measure her temperature at home, but states she did have chills. She reports the cough is dry and worse at night. She denies rhinorrhea, sinus pressure, or otalgia. Pt states she has been on doxycycline for a skin infection and has been taking it as prescribed. Pt denies chest pain, shortness of breath, or reflux symptoms.   Relevant past medical, surgical, family and social history reviewed and updated as indicated. Interim medical history since our last visit reviewed. Allergies and medications reviewed and updated. DATA REVIEWED: CHART IN EPIC  Family History reviewed for pertinent findings.  Past Medical History:  Diagnosis Date  . Cataract   . Colon polyps   . Hypercholesteremia   . Hypertension     Past Surgical History:  Procedure Laterality Date  . ABDOMINAL HYSTERECTOMY    . BILATERAL OOPHORECTOMY    . CATARACT EXTRACTION, BILATERAL  2014  . COLONOSCOPY    . COLONOSCOPY N/A 05/15/2013   Procedure: COLONOSCOPY;  Surgeon: Rogene Houston, MD;  Location: [facility];  Service: Endoscopy;  Laterality: N/A;  930  . cranial shunt     17 years ago    Social History   Socioeconomic History  . Marital status: Married    Spouse name: Not on file  . Number of children: 2  . Years of education: 61  . Highest education level: 12th grade  Occupational History  . Occupation: Retired    Comment: Environmental health practitioner  . Occupation: [employer]: [employer]    Comment: 20 to 25 hours a week part time  Social Needs  . Financial resource strain: Not hard at all  . Food  insecurity:    Worry: Never true    Inability: Never true  . Transportation needs:    Medical: No    Non-medical: No  Tobacco Use  . Smoking status: Never Smoker  . Smokeless tobacco: Never Used  Substance and Sexual Activity  . Alcohol use: No  . Drug use: No  . Sexual activity: Yes  Lifestyle  . Physical activity:    Days per week: 3 days    Minutes per session: 90 min  . Stress: Not at all  Relationships  . Social connections:    Talks on phone: More than three times a week    Gets together: More than three times a week    Attends religious service: More than 4 times per year    Active member of club or organization: Yes    Attends meetings of clubs or organizations: More than 4 times per year    Relationship status: Married  . Intimate partner violence:    Fear of current or ex partner: No    Emotionally abused: No    Physically abused: No    Forced sexual activity: Not on file  Other Topics Concern  . Not on file  Social History Narrative  . Not on file    Outpatient Encounter Medications as of 05/24/2018  Medication Sig  . calcium carbonate (OS-CAL) 600 MG TABS tablet Take 600  mg by mouth daily.  . cetirizine (ZYRTEC) 10 MG tablet Take 1 tablet (10 mg total) by mouth daily.  Marland Kitchen doxycycline (VIBRAMYCIN) 100 MG capsule Take 100 mg by mouth 2 (two) times daily.  Marland Kitchen ezetimibe (ZETIA) 10 MG tablet Take 1 tablet by mouth daily.  Marland Kitchen lisinopril-hydrochlorothiazide (PRINZIDE,ZESTORETIC) 20-25 MG tablet TAKE 1 TABLET BY MOUTH  DAILY  . vitamin B-12 (CYANOCOBALAMIN) 100 MCG tablet Vitamin B12  . benzonatate (TESSALON PERLES) 100 MG capsule Take 1 capsule (100 mg total) by mouth 3 (three) times daily as needed for cough.   No facility-administered encounter medications on file as of 05/24/2018.     Allergies  Allergen Reactions  . Asa [Aspirin] Hives and Swelling    Review of Systems  Constitutional: Positive for chills. Negative for activity change, appetite change,  fatigue, fever and unexpected weight change.  HENT: Positive for congestion and postnasal drip. Negative for ear pain, rhinorrhea, sinus pressure, sinus pain, sore throat and tinnitus.   Respiratory: Positive for cough. Negative for chest tightness and shortness of breath.   Cardiovascular: Negative for chest pain and palpitations.  Gastrointestinal: Negative for nausea and vomiting.  Neurological: Negative for dizziness, light-headedness and headaches.  All other systems reviewed and are negative.       Objective:    BP (!) 155/82   Pulse 83   Temp 97.6 F (36.4 C) (Oral)   Ht 5' 4.5" (1.638 m)   Wt 150 lb (68 kg)   BMI 25.35 kg/m    Wt Readings from Last 3 Encounters:  05/24/18 150 lb (68 kg)  02/06/18 150 lb 3.2 oz (68.1 kg)  01/15/18 150 lb (68 kg)    Physical Exam  Constitutional: She is oriented to person, place, and time. She appears well-developed and well-nourished. She is cooperative.  HENT:  Head: Normocephalic and atraumatic.  Right Ear: Hearing, tympanic membrane, external ear and ear canal normal.  Left Ear: Hearing, tympanic membrane, external ear and ear canal normal.  Nose: Mucosal edema present. No rhinorrhea. Right sinus exhibits no maxillary sinus tenderness and no frontal sinus tenderness. Left sinus exhibits no maxillary sinus tenderness and no frontal sinus tenderness.  Mouth/Throat: Uvula is midline, oropharynx is clear and moist and mucous membranes are normal. No oropharyngeal exudate, posterior oropharyngeal edema, posterior oropharyngeal erythema or tonsillar abscesses.  Postnasal drainage  Eyes: Pupils are equal, round, and reactive to light. Conjunctivae are normal.  Neck: Trachea normal, normal range of motion and phonation normal. Neck supple.  Cardiovascular: Normal rate, regular rhythm and normal heart sounds.  Pulmonary/Chest: Effort normal and breath sounds normal.  Lymphadenopathy:    She has no cervical adenopathy.  Neurological: She is  alert and oriented to person, place, and time.  Skin: Skin is warm and dry. Capillary refill takes less than 2 seconds.     Psychiatric: She has a normal mood and affect. Her behavior is normal. Judgment and thought content normal.        Assessment & Plan:   1. Cough Increase humidity in the air, increase water intake. Cough drops or throat lozenges. Medications as prescribed.  - benzonatate (TESSALON PERLES) 100 MG capsule; Take 1 capsule (100 mg total) by mouth 3 (three) times daily as needed for cough.  Dispense: 20 capsule; Refill: 0  2. Viral upper respiratory tract infection Increase humidity in the air, increase water intake. Cough drops or throat lozenges. Medications as prescribed.  Can get coricidin HBP and take as needed for congestion. Continue zyrtec.  Continue all other maintenance medications.  Follow up plan: Return if symptoms worsen or fail to improve.  Educational handout given for upper respiratory infection  The above assessment and management plan was discussed with the patient. The patient verbalized understanding of and has agreed to the management plan. Patient is aware to call the clinic if symptoms persist or worsen. Patient is aware when to return to the clinic for a follow-up visit. Patient educated on when it is appropriate to go to the emergency department.   Monia Pouch, FNP-C [facility] [phone]

## 2018-05-24 NOTE — Patient Instructions (Signed)
Upper Respiratory Infection, Adult Most upper respiratory infections (URIs) are caused by a virus. A URI affects the nose, throat, and upper air passages. The most common type of URI is often called "the common cold." Follow these instructions at home:  Take medicines only as told by your doctor.  Gargle warm saltwater or take cough drops to comfort your throat as told by your doctor.  Use a warm mist humidifier or inhale steam from a shower to increase air moisture. This may make it easier to breathe.  Drink enough fluid to keep your pee (urine) clear or pale yellow.  Eat soups and other clear broths.  Have a healthy diet.  Rest as needed.  Go back to work when your fever is gone or your doctor says it is okay. ? You may need to stay home longer to avoid giving your URI to others. ? You can also wear a face mask and wash your hands often to prevent spread of the virus.  Use your inhaler more if you have asthma.  Do not use any tobacco products, including cigarettes, chewing tobacco, or electronic cigarettes. If you need help quitting, ask your doctor. Contact a doctor if:  You are getting worse, not better.  Your symptoms are not helped by medicine.  You have chills.  You are getting more short of breath.  You have brown or red mucus.  You have yellow or brown discharge from your nose.  You have pain in your face, especially when you bend forward.  You have a fever.  You have puffy (swollen) neck glands.  You have pain while swallowing.  You have white areas in the back of your throat. Get help right away if:  You have very bad or constant: ? Headache. ? Ear pain. ? Pain in your forehead, behind your eyes, and over your cheekbones (sinus pain). ? Chest pain.  You have long-lasting (chronic) lung disease and any of the following: ? Wheezing. ? Long-lasting cough. ? Coughing up blood. ? A change in your usual mucus.  You have a stiff neck.  You have  changes in your: ? Vision. ? Hearing. ? Thinking. ? Mood. This information is not intended to replace advice given to you by your health care provider. Make sure you discuss any questions you have with your health care provider. Document Released: 02/01/2008 Document Revised: 04/17/2016 Document Reviewed: 11/20/2013 Elsevier Interactive Patient Education  2018 Elsevier Inc.  

## 2018-05-29 ENCOUNTER — Other Ambulatory Visit: Payer: Self-pay

## 2018-05-29 ENCOUNTER — Other Ambulatory Visit (INDEPENDENT_AMBULATORY_CARE_PROVIDER_SITE_OTHER): Payer: Self-pay | Admitting: *Deleted

## 2018-05-29 DIAGNOSIS — Z86010 Personal history of colonic polyps: Secondary | ICD-10-CM

## 2018-05-29 NOTE — Patient Outreach (Signed)
[facility]) Care Management  05/29/2018  Erin Khan 04-07-1941 [id number]   Medication Adherence call to [name] left a message for patient to call back patient is due on Lisinopril / Hctz 20/25 mg.[name] is showing past due under [employer].  [facility] Direct Dial [phone]  Fax [phone] [email or web address]

## 2018-05-30 ENCOUNTER — Ambulatory Visit: Payer: Medicare Other | Admitting: Family Medicine

## 2018-05-30 ENCOUNTER — Ambulatory Visit (INDEPENDENT_AMBULATORY_CARE_PROVIDER_SITE_OTHER): Payer: Medicare Other | Admitting: Family Medicine

## 2018-05-30 ENCOUNTER — Encounter: Payer: Self-pay | Admitting: Family Medicine

## 2018-05-30 VITALS — BP 160/76 | HR 68 | Temp 97.4°F | Ht 64.0 in | Wt 146.0 lb

## 2018-05-30 DIAGNOSIS — L089 Local infection of the skin and subcutaneous tissue, unspecified: Secondary | ICD-10-CM

## 2018-05-30 DIAGNOSIS — L723 Sebaceous cyst: Secondary | ICD-10-CM | POA: Diagnosis not present

## 2018-05-30 MED ORDER — CIPROFLOXACIN HCL 500 MG PO TABS: 500.0000 mg | ORAL_TABLET | Freq: Two times a day (BID) | ORAL | 0 refills | Status: DC

## 2018-05-30 NOTE — Progress Notes (Signed)
Chief Complaint  Patient presents with  . URI no better    Seen last week and no better   Wants you to look at cyst on back as well    HPI  Patient presents today for her last feeling of malaise with chills and low-grade fever.  Her cough has resolved.  She has an abscess on her back that is been treated with doxycycline.  She finished that today.  She is under the care of a dermatologist in [place] for this.  They suggested that once the antibiotic was finished if it was still draining to let them know.  However they wanted to get rid of the drainage to her antibiotics if possible before excising.  The lesion is moderately tender and draining rather profusely.  PMH: Smoking status noted ROS: Per HPI  Objective: BP (!) 160/76   Pulse 68   Temp (!) 97.4 F (36.3 C) (Oral)   Ht 5\' 4"  (1.626 m)   Wt 146 lb (66.2 kg)   BMI 25.06 kg/m  Gen: NAD, alert, cooperative with exam HEENT: NCAT, EOMI, PERRL CV: RRR, good S1/S2, no murmur Resp: CTABL, no wheezes, non-labored Skin: There is a erythematous raised sebaceous cyst draining moderate amounts of purulent material between the shoulder blades this measures about 2 cm in size. Ext: No edema, warm Neuro: Alert and oriented, No gross deficits  Assessment and plan:  1. Infected sebaceous cyst     Meds ordered this encounter  Medications  . ciprofloxacin (CIPRO) 500 MG tablet    Sig: Take 1 tablet (500 mg total) by mouth 2 (two) times daily.    Dispense:  14 tablet    Refill:  0    Since she is under active care from a specialist with this problem I suggested she call them immediately and have them reevaluate this.  However I suggested that it needed to be drained and/or excised ASAP.  Follow up as needed.  Claretta Fraise, MD

## 2018-05-31 DIAGNOSIS — L720 Epidermal cyst: Secondary | ICD-10-CM | POA: Diagnosis not present

## 2018-06-06 ENCOUNTER — Ambulatory Visit (HOSPITAL_COMMUNITY)
Admission: RE | Admit: 2018-06-06 | Discharge: 2018-06-06 | Disposition: A | Discharge status: Home / Self Care | Payer: Medicare Other | Source: Ambulatory Visit | Attending: Obstetrics and Gynecology | Admitting: Obstetrics and Gynecology

## 2018-06-06 DIAGNOSIS — Z1231 Encounter for screening mammogram for malignant neoplasm of breast: Secondary | ICD-10-CM | POA: Diagnosis not present

## 2018-06-11 DIAGNOSIS — Z08 Encounter for follow-up examination after completed treatment for malignant neoplasm: Secondary | ICD-10-CM | POA: Diagnosis not present

## 2018-06-11 DIAGNOSIS — Z85828 Personal history of other malignant neoplasm of skin: Secondary | ICD-10-CM | POA: Diagnosis not present

## 2018-06-12 ENCOUNTER — Ambulatory Visit (INDEPENDENT_AMBULATORY_CARE_PROVIDER_SITE_OTHER): Payer: Medicare Other

## 2018-06-12 DIAGNOSIS — Z23 Encounter for immunization: Secondary | ICD-10-CM

## 2018-06-21 NOTE — Telephone Encounter (Signed)
Erroneous encounter

## 2018-06-28 DIAGNOSIS — M9905 Segmental and somatic dysfunction of pelvic region: Secondary | ICD-10-CM | POA: Diagnosis not present

## 2018-06-28 DIAGNOSIS — M9903 Segmental and somatic dysfunction of lumbar region: Secondary | ICD-10-CM | POA: Diagnosis not present

## 2018-06-28 DIAGNOSIS — M5137 Other intervertebral disc degeneration, lumbosacral region: Secondary | ICD-10-CM | POA: Diagnosis not present

## 2018-06-28 DIAGNOSIS — M9904 Segmental and somatic dysfunction of sacral region: Secondary | ICD-10-CM | POA: Diagnosis not present

## 2018-07-03 ENCOUNTER — Telehealth (INDEPENDENT_AMBULATORY_CARE_PROVIDER_SITE_OTHER): Payer: Self-pay | Admitting: *Deleted

## 2018-07-03 ENCOUNTER — Encounter (INDEPENDENT_AMBULATORY_CARE_PROVIDER_SITE_OTHER): Payer: Self-pay | Admitting: *Deleted

## 2018-07-03 MED ORDER — SUPREP BOWEL PREP KIT 17.5-3.13-1.6 GM/177ML PO SOLN: 1.0000 | Freq: Once | ORAL | 0 refills | Status: AC

## 2018-07-03 NOTE — Telephone Encounter (Signed)
Patient needs suprep 

## 2018-07-06 DIAGNOSIS — Z960 Presence of urogenital implants: Secondary | ICD-10-CM | POA: Diagnosis not present

## 2018-07-06 DIAGNOSIS — N819 Female genital prolapse, unspecified: Secondary | ICD-10-CM | POA: Diagnosis not present

## 2018-07-06 DIAGNOSIS — N8189 Other female genital prolapse: Secondary | ICD-10-CM | POA: Diagnosis not present

## 2018-07-17 ENCOUNTER — Telehealth (INDEPENDENT_AMBULATORY_CARE_PROVIDER_SITE_OTHER): Payer: Self-pay | Admitting: *Deleted

## 2018-07-17 NOTE — Telephone Encounter (Signed)
Referring MD/PCP: bradshaw   Procedure: tcs  Reason/Indication:  Hx polyps  Has patient had this procedure before?  Yes, 2014  If so, when, by whom and where?    Is there a family history of colon cancer?  no  Who?  What age when diagnosed?    Is patient diabetic?   no      Does patient have prosthetic heart valve or mechanical valve?  no  Do you have a pacemaker?  no  Has patient ever had endocarditis? no  Has patient had joint replacement within last 12 months?  no  Is patient constipated or do they take laxatives? no  Does patient have a history of alcohol/drug use?  no  Is patient on blood thinner such as Coumadin, Plavix and/or Aspirin? no  Medications: calcium 600 + D3 daily, ezetimibe 10 mg daily, lisinopril/hctz 20/25 mg daily, vit d3 50 mg daily, vit b12 daily  Allergies: asa  Medication Adjustment per Dr Lindi Adie, NP:   Procedure date & time: 08/15/18 at 830

## 2018-07-18 NOTE — Telephone Encounter (Signed)
agree

## 2018-08-01 ENCOUNTER — Other Ambulatory Visit: Payer: Self-pay | Admitting: *Deleted

## 2018-08-01 MED ORDER — LISINOPRIL-HYDROCHLOROTHIAZIDE 20-25 MG PO TABS: 1.0000 | ORAL_TABLET | Freq: Every day | ORAL | 0 refills | Status: DC

## 2018-08-01 MED ORDER — EZETIMIBE 10 MG PO TABS: 10.0000 mg | ORAL_TABLET | Freq: Every day | ORAL | 0 refills | Status: DC

## 2018-08-06 ENCOUNTER — Telehealth (INDEPENDENT_AMBULATORY_CARE_PROVIDER_SITE_OTHER): Payer: Self-pay | Admitting: Internal Medicine

## 2018-08-06 NOTE — Telephone Encounter (Signed)
Patient called needs to cancel colonoscopy for 08-15-18

## 2018-08-07 NOTE — Telephone Encounter (Signed)
TCS resch'd to 10/18/18, patient aware

## 2018-09-03 ENCOUNTER — Encounter (INDEPENDENT_AMBULATORY_CARE_PROVIDER_SITE_OTHER): Payer: Self-pay | Admitting: *Deleted

## 2018-09-03 NOTE — Telephone Encounter (Addendum)
error 

## 2018-09-03 NOTE — Telephone Encounter (Signed)
This encounter was created in error - please disregard.

## 2018-09-11 DIAGNOSIS — N819 Female genital prolapse, unspecified: Secondary | ICD-10-CM | POA: Diagnosis not present

## 2018-09-11 DIAGNOSIS — Z1389 Encounter for screening for other disorder: Secondary | ICD-10-CM | POA: Diagnosis not present

## 2018-09-11 DIAGNOSIS — Z6824 Body mass index (BMI) 24.0-24.9, adult: Secondary | ICD-10-CM | POA: Diagnosis not present

## 2018-09-11 DIAGNOSIS — Z960 Presence of urogenital implants: Secondary | ICD-10-CM | POA: Diagnosis not present

## 2018-09-12 DIAGNOSIS — R05 Cough: Secondary | ICD-10-CM | POA: Diagnosis not present

## 2018-09-12 DIAGNOSIS — J209 Acute bronchitis, unspecified: Secondary | ICD-10-CM | POA: Diagnosis not present

## 2018-09-12 DIAGNOSIS — R070 Pain in throat: Secondary | ICD-10-CM | POA: Diagnosis not present

## 2018-09-13 ENCOUNTER — Other Ambulatory Visit: Payer: Self-pay

## 2018-09-14 ENCOUNTER — Telehealth (INDEPENDENT_AMBULATORY_CARE_PROVIDER_SITE_OTHER): Payer: Self-pay | Admitting: *Deleted

## 2018-09-14 DIAGNOSIS — R509 Fever, unspecified: Secondary | ICD-10-CM | POA: Diagnosis not present

## 2018-09-14 NOTE — Telephone Encounter (Signed)
Referring MD/PCP: bradshaw   Procedure: tcs  Reason/Indication:  Hx polyps  Has patient had this procedure before?  Yes, 2014             If so, when, by whom and where?    Is there a family history of colon cancer?  no             Who?  What age when diagnosed?    Is patient diabetic?   no                                                  Does patient have prosthetic heart valve or mechanical valve?  no  Do you have a pacemaker?  no  Has patient ever had endocarditis? no  Has patient had joint replacement within last 12 months?  no  Is patient constipated or do they take laxatives? no  Does patient have a history of alcohol/drug use?  no  Is patient on blood thinner such as Coumadin, Plavix and/or Aspirin? no  Medications: calcium 600 + D3 daily, ezetimibe 10 mg daily, lisinopril/hctz 20/25 mg daily, vit d3 50 mg daily, vit b12 daily  Allergies: asa  Medication Adjustment per Dr Lindi Adie, NP:   Procedure date & time: 10/18/18 at 1030

## 2018-09-18 NOTE — Telephone Encounter (Signed)
agree

## 2018-10-18 ENCOUNTER — Encounter (HOSPITAL_COMMUNITY)
Admission: RE | Disposition: A | Discharge status: Home / Self Care | Payer: Self-pay | Source: Home / Self Care | Attending: Internal Medicine

## 2018-10-18 ENCOUNTER — Ambulatory Visit (HOSPITAL_COMMUNITY)
Admission: RE | Admit: 2018-10-18 | Discharge: 2018-10-18 | Disposition: A | Discharge status: Home / Self Care | Payer: Medicare Other | Attending: Internal Medicine | Admitting: Internal Medicine

## 2018-10-18 ENCOUNTER — Encounter (HOSPITAL_COMMUNITY): Payer: Self-pay

## 2018-10-18 ENCOUNTER — Other Ambulatory Visit: Payer: Self-pay

## 2018-10-18 DIAGNOSIS — D126 Benign neoplasm of colon, unspecified: Secondary | ICD-10-CM | POA: Diagnosis not present

## 2018-10-18 DIAGNOSIS — K5730 Diverticulosis of large intestine without perforation or abscess without bleeding: Secondary | ICD-10-CM | POA: Insufficient documentation

## 2018-10-18 DIAGNOSIS — E7800 Pure hypercholesterolemia, unspecified: Secondary | ICD-10-CM | POA: Diagnosis not present

## 2018-10-18 DIAGNOSIS — K635 Polyp of colon: Secondary | ICD-10-CM | POA: Insufficient documentation

## 2018-10-18 DIAGNOSIS — K644 Residual hemorrhoidal skin tags: Secondary | ICD-10-CM | POA: Insufficient documentation

## 2018-10-18 DIAGNOSIS — Z79890 Hormone replacement therapy (postmenopausal): Secondary | ICD-10-CM | POA: Insufficient documentation

## 2018-10-18 DIAGNOSIS — Z1211 Encounter for screening for malignant neoplasm of colon: Secondary | ICD-10-CM | POA: Insufficient documentation

## 2018-10-18 DIAGNOSIS — Z960 Presence of urogenital implants: Secondary | ICD-10-CM | POA: Insufficient documentation

## 2018-10-18 DIAGNOSIS — D123 Benign neoplasm of transverse colon: Secondary | ICD-10-CM

## 2018-10-18 DIAGNOSIS — K6389 Other specified diseases of intestine: Secondary | ICD-10-CM

## 2018-10-18 DIAGNOSIS — I10 Essential (primary) hypertension: Secondary | ICD-10-CM | POA: Diagnosis not present

## 2018-10-18 DIAGNOSIS — Z09 Encounter for follow-up examination after completed treatment for conditions other than malignant neoplasm: Secondary | ICD-10-CM

## 2018-10-18 DIAGNOSIS — Z86010 Personal history of colonic polyps: Secondary | ICD-10-CM | POA: Insufficient documentation

## 2018-10-18 DIAGNOSIS — D125 Benign neoplasm of sigmoid colon: Secondary | ICD-10-CM

## 2018-10-18 DIAGNOSIS — Z79899 Other long term (current) drug therapy: Secondary | ICD-10-CM | POA: Diagnosis not present

## 2018-10-18 DIAGNOSIS — K6289 Other specified diseases of anus and rectum: Secondary | ICD-10-CM | POA: Insufficient documentation

## 2018-10-18 HISTORY — PX: COLONOSCOPY: SHX5424

## 2018-10-18 HISTORY — PX: POLYPECTOMY: SHX5525

## 2018-10-18 SURGERY — COLONOSCOPY
Anesthesia: Moderate Sedation

## 2018-10-18 MED ORDER — MEPERIDINE HCL 50 MG/ML IJ SOLN
INTRAMUSCULAR | Status: DC | PRN
  Administered 2018-10-18 (×2): 25 mg via INTRAVENOUS

## 2018-10-18 MED ORDER — SODIUM CHLORIDE 0.9 % IV SOLN
INTRAVENOUS | Status: DC
  Administered 2018-10-18: 10:00:00 via INTRAVENOUS

## 2018-10-18 MED ORDER — MIDAZOLAM HCL 5 MG/5ML IJ SOLN
INTRAMUSCULAR | Status: DC | PRN
  Administered 2018-10-18: 2 mg via INTRAVENOUS
  Administered 2018-10-18 (×3): 1 mg via INTRAVENOUS

## 2018-10-18 MED ORDER — MIDAZOLAM HCL 5 MG/5ML IJ SOLN
INTRAMUSCULAR | Status: AC
  Filled 2018-10-18: qty 10

## 2018-10-18 MED ORDER — MEPERIDINE HCL 100 MG/ML IJ SOLN
INTRAMUSCULAR | Status: AC
  Filled 2018-10-18: qty 1

## 2018-10-18 MED ORDER — STERILE WATER FOR IRRIGATION IR SOLN
Status: DC | PRN
  Administered 2018-10-18: 10:00:00

## 2018-10-18 NOTE — H&P (Signed)
Erin Khan is an 78 y.o. female.   Chief Complaint: Patient is here for colonoscopy. HPI: Patient 78 year old Caucasian female with history of colonic polyps and is here for surveillance colonoscopy.  Last exam was in September 2014 with removal of small tubular adenoma as well as sessile serrated polyp.  She denies abdominal pain change in bowel habits or rectal bleeding. Family history is negative for CRC.  She had for present for intracranial hypertension or hydrocephalus shunt she had a shunt but was indicated  Past Medical History:  Diagnosis Date  . Cataract   . Colon polyps   . Hypercholesteremia   . Hypertension     Past Surgical History:  Procedure Laterality Date  . ABDOMINAL HYSTERECTOMY    . BILATERAL OOPHORECTOMY    . CATARACT EXTRACTION, BILATERAL  2014  . CHOLECYSTECTOMY  1980  . COLONOSCOPY    . COLONOSCOPY N/A 05/15/2013   Procedure: COLONOSCOPY;  Surgeon: Rogene Houston, MD;  Location: [facility];  Service: Endoscopy;  Laterality: N/A;  930  . cranial shunt     17 years ago    Family History  Problem Relation Age of Onset  . Lung cancer Sister   . Hypertension Father   . Heart attack Brother   . Arthritis Sister   . Asthma Brother   . Healthy Daughter   . Healthy Son   . Colon cancer Neg Hx    Social History:  reports that she has never smoked. She has never used smokeless tobacco. She reports that she does not drink alcohol or use drugs.  Allergies:  Allergies  Allergen Reactions  . Asa [Aspirin] Hives and Swelling    Medications Prior to Admission  Medication Sig Dispense Refill  . Calcium Carbonate-Vitamin D (CALCIUM 600+D PO) Take 1 tablet by mouth daily.    . Ergocalciferol (VITAMIN D2) 10 MCG (400 UNIT) TABS Take 400 Units by mouth daily.    . Estradiol 0.25 NG/2.95MW GEL Place 1 Applicatorful vaginally 3 (three) times a week.    . ezetimibe (ZETIA) 10 MG tablet Take 1 tablet (10 mg total) by mouth daily. (Needs to be seen) 90 tablet 0  .  lisinopril-hydrochlorothiazide (PRINZIDE,ZESTORETIC) 20-25 MG tablet Take 1 tablet by mouth daily. (Needs to be seen) 90 tablet 0  . vitamin B-12 (CYANOCOBALAMIN) 500 MCG tablet Take 500 mcg by mouth daily.       No results found for this or any previous visit (from the past 48 hour(s)). No results found.  ROS  Blood pressure (!) 193/91, pulse 78, resp. rate 18, height 5\' 6"  (1.676 m), weight 68 kg, SpO2 98 %. Physical Exam  Constitutional: She appears well-developed and well-nourished.  HENT:  Mouth/Throat: Oropharynx is clear and moist.  Eyes: Conjunctivae are normal. No scleral icterus.  Neck: No thyromegaly present.  Cardiovascular: Normal rate, regular rhythm and normal heart sounds.  No murmur heard. Respiratory: Effort normal and breath sounds normal.  GI:  Abdomen is symmetrical.  She has 3 scars.  She has long lower midline scar extending to about the level of umbilicus.  She has right subcostal scar as well as scar right mid abdomen from previous surgery for placement of the shunt.  Musculoskeletal:        General: No edema.  Lymphadenopathy:    She has no cervical adenopathy.  Neurological: She is alert.  Skin: Skin is warm and dry.     Assessment/Plan History of colonic polyps. Surveillance colonoscopy.  [employer],  MD 10/18/2018, 10:01 AM

## 2018-10-18 NOTE — Op Note (Signed)
[facility] Patient Name: Erin Khan Procedure Date: 10/18/2018 9:56 AM MRN: [id number] Date of Birth: [birth year] Attending MD: Hildred Laser , MD CSN: [id number] Age: 78 Admit Type: Outpatient Procedure:                Colonoscopy Indications:              High risk colon cancer surveillance: Personal                            history of colonic polyps Providers:                Hildred Laser, MD, Otis Peak B. Sharon Seller, RN, Randa Spike, Technician Referring MD:             Baruch Gouty, FNP Medicines:                Meperidine 50 mg IV, Midazolam 5 mg IV Complications:            No immediate complications. Estimated Blood Loss:     Estimated blood loss was minimal. Procedure:                Pre-Anesthesia Assessment:                           - Prior to the procedure, a History and Physical                            was performed, and patient medications and                            allergies were reviewed. The patient's tolerance of                            previous anesthesia was also reviewed. The risks                            and benefits of the procedure and the sedation                            options and risks were discussed with the patient.                            All questions were answered, and informed consent                            was obtained. Prior Anticoagulants: The patient has                            taken no previous anticoagulant or antiplatelet                            agents. ASA Grade Assessment: III - A patient with  severe systemic disease. After reviewing the risks                            and benefits, the patient was deemed in                            satisfactory condition to undergo the procedure.                           After obtaining informed consent, the colonoscope                            was passed under direct vision. Throughout the       procedure, the patient's blood pressure, pulse, and                            oxygen saturations were monitored continuously. The                            [id number]-[id number] ([id number]) scope was introduced through                            the anus and advanced to the the cecum, identified                            by appendiceal orifice and ileocecal valve. The                            colonoscopy was performed without difficulty. The                            patient tolerated the procedure well. The quality                            of the bowel preparation was adequate. The                            ileocecal valve, appendiceal orifice, and rectum                            were photographed. Scope In: 10:12:46 AM Scope Out: 10:44:00 AM Scope Withdrawal Time: 0 hours 17 minutes 12 seconds  Total Procedure Duration: 0 hours 31 minutes 14 seconds  Findings:      Skin tags were found on perianal exam.      Three sessile polyps were found in the mid sigmoid colon and hepatic       flexure. The polyps were 4 to 7 mm in size. These polyps were removed       with a cold snare. Resection and retrieval were complete. The pathology       specimen was placed into Bottle Number 1.      A few diverticula were found in the sigmoid colon.      A diffuse area of mild melanosis was found in the sigmoid colon and at       the  splenic flexure.      External hemorrhoids were found during retroflexion. The hemorrhoids       were small.      Extrinsic impression at rectum due to pessary.      Anal papilla(e) were hypertrophied. Impression:               - Perianal skin tags found on perianal exam.                           - Three 4 to 7 mm polyps in the mid sigmoid colon                            and at the hepatic flexure, removed with a cold                            snare. Resected and retrieved.                           - Diverticulosis in the sigmoid colon.                           -  Melanosis in the colon.                           - External hemorrhoids.                           - Anal papilla(e) were hypertrophied. Moderate Sedation:      Moderate (conscious) sedation was administered by the endoscopy nurse       and supervised by the endoscopist. The following parameters were       monitored: oxygen saturation, heart rate, blood pressure, CO2       capnography and response to care. Total physician intraservice time was       37 minutes. Recommendation:           - Patient has a contact number available for                            emergencies. The signs and symptoms of potential                            delayed complications were discussed with the                            patient. Return to normal activities tomorrow.                            Written discharge instructions were provided to the                            patient.                           - High fiber diet today.                           -  Continue present medications.                           - No aspirin, ibuprofen, naproxen, or other                            non-steroidal anti-inflammatory drugs for 1 day.                           - Await pathology results.                           - Repeat colonoscopy is recommended. The                            colonoscopy date will be determined after pathology                            results from today's exam become available for                            review. Procedure Code(s):        --- Professional ---                           [phone], Colonoscopy, flexible; with removal of                            tumor(s), polyp(s), or other lesion(s) by snare                            technique                           99153, Moderate sedation; each additional 15                            minutes intraservice time                           G0500, Moderate sedation services provided by the                            same physician or  other qualified health care                            professional performing a gastrointestinal                            endoscopic service that sedation supports,                            requiring the presence of an independent trained                            observer to assist in the monitoring of the  patient's level of consciousness and physiological                            status; initial 15 minutes of intra-service time;                            patient age 67 years or older (additional time may                            be reported with [phone], as appropriate) Diagnosis Code(s):        --- Professional ---                           K64.4, Residual hemorrhoidal skin tags                           Z86.010, Personal history of colonic polyps                           D12.5, Benign neoplasm of sigmoid colon                           D12.3, Benign neoplasm of transverse colon (hepatic                            flexure or splenic flexure)                           K63.89, Other specified diseases of intestine                           K62.89, Other specified diseases of anus and rectum                           K57.30, Diverticulosis of large intestine without                            perforation or abscess without bleeding CPT copyright 2018 [employer]. All rights reserved. The codes documented in this report are preliminary and upon coder review may  be revised to meet current compliance requirements. Hildred Laser, MD Hildred Laser, MD 10/18/2018 10:55:21 AM This report has been signed electronically. Number of Addenda: 0

## 2018-10-18 NOTE — Discharge Instructions (Signed)
No NSAIDs for 24 hours.  Can take Tylenol as as needed. Resume usual medications as before. High-fiber diet. No driving for 24 hours. Physician will call with biopsy results.   Colonoscopy, Adult, Care After This sheet gives you information about how to care for yourself after your procedure. Your doctor may also give you more specific instructions. If you have problems or questions, call your doctor. What can I expect after the procedure? After the procedure, it is common to have:  A small amount of blood in your poop for 24 hours.  Some gas.  Mild cramping or bloating in your belly. Follow these instructions at home: General instructions  For the first 24 hours after the procedure: ? Do not drive or use machinery. ? Do not sign important documents. ? Do not drink alcohol. ? Do your daily activities more slowly than normal. ? Eat foods that are soft and easy to digest.  Take over-the-counter or prescription medicines only as told by your doctor. To help cramping and bloating:   Try walking around.  Put heat on your belly (abdomen) as told by your doctor. Use a heat source that your doctor recommends, such as a moist heat pack or a heating pad. ? Put a towel between your skin and the heat source. ? Leave the heat on for 20-30 minutes. ? Remove the heat if your skin turns bright red. This is especially important if you cannot feel pain, heat, or cold. You can get burned. Eating and drinking   Drink enough fluid to keep your pee (urine) clear or pale yellow.  Return to your normal diet as told by your doctor. Avoid heavy or fried foods that are hard to digest.  Avoid drinking alcohol for as long as told by your doctor. Contact a doctor if:  You have blood in your poop (stool) 2-3 days after the procedure. Get help right away if:  You have more than a small amount of blood in your poop.  You see large clumps of tissue (blood clots) in your poop.  Your belly is  swollen.  You feel sick to your stomach (nauseous).  You throw up (vomit).  You have a fever.  You have belly pain that gets worse, and medicine does not help your pain. Summary  After the procedure, it is common to have a small amount of blood in your poop. You may also have mild cramping and bloating in your belly.  For the first 24 hours after the procedure, do not drive or use machinery, do not sign important documents, and do not drink alcohol.  Get help right away if you have a lot of blood in your poop, feel sick to your stomach, have a fever, or have more belly pain. This information is not intended to replace advice given to you by your health care provider. Make sure you discuss any questions you have with your health care provider. Document Released: 09/17/2010 Document Revised: 06/15/2017 Document Reviewed: 05/09/2016 Elsevier Interactive Patient Education  2019 [place].  Colon Polyps  Polyps are tissue growths inside the body. Polyps can grow in many places, including the large intestine (colon). A polyp may be a round bump or a mushroom-shaped growth. You could have one polyp or several. Most colon polyps are noncancerous (benign). However, some colon polyps can become cancerous over time. Finding and removing the polyps early can help prevent this. What are the causes? The exact cause of colon polyps is not known. What increases  the risk? You are more likely to develop this condition if you:  Have a family history of colon cancer or colon polyps.  Are older than 61 or older than 45 if you are African American.  Have inflammatory bowel disease, such as ulcerative colitis or Crohn's disease.  Have certain hereditary conditions, such as: ? Familial adenomatous polyposis. ? Lynch syndrome. ? Turcot syndrome. ? Peutz-Jeghers syndrome.  Are overweight.  Smoke cigarettes.  Do not get enough exercise.  Drink too much alcohol.  Eat a diet that is high in  fat and red meat and low in fiber.  Had childhood cancer that was treated with abdominal radiation. What are the signs or symptoms? Most polyps do not cause symptoms. If you have symptoms, they may include:  Blood coming from your rectum when having a bowel movement.  Blood in your stool. The stool may look dark red or black.  Abdominal pain.  A change in bowel habits, such as constipation or diarrhea. How is this diagnosed? This condition is diagnosed with a colonoscopy. This is a procedure in which a lighted, flexible scope is inserted into the anus and then passed into the colon to examine the area. Polyps are sometimes found when a colonoscopy is done as part of routine cancer screening tests. How is this treated? Treatment for this condition involves removing any polyps that are found. Most polyps can be removed during a colonoscopy. Those polyps will then be tested for cancer. Additional treatment may be needed depending on the results of testing. Follow these instructions at home: Lifestyle  Maintain a healthy weight, or lose weight if recommended by your health care provider.  Exercise every day or as told by your health care provider.  Do not use any products that contain nicotine or tobacco, such as cigarettes and e-cigarettes. If you need help quitting, ask your health care provider.  If you drink alcohol, limit how much you have: ? 0-1 drink a day for women. ? 0-2 drinks a day for men.  Be aware of how much alcohol is in your drink. In the [place] one drink equals one 12 oz bottle of beer (355 mL), one 5 oz glass of wine (148 mL), or one 1 oz shot of hard liquor (44 mL). Eating and drinking   Eat foods that are high in fiber, such as fruits, vegetables, and whole grains.  Eat foods that are high in calcium and vitamin D, such as milk, cheese, yogurt, eggs, liver, fish, and broccoli.  Limit foods that are high in fat, such as fried foods and desserts.  Limit the  amount of red meat and processed meat you eat, such as hot dogs, sausage, bacon, and lunch meats. General instructions  Keep all follow-up visits as told by your health care provider. This is important. ? This includes having regularly scheduled colonoscopies. ? Talk to your health care provider about when you need a colonoscopy. Contact a health care provider if:  You have new or worsening bleeding during a bowel movement.  You have new or increased blood in your stool.  You have a change in bowel habits.  You lose weight for no known reason. Summary  Polyps are tissue growths inside the body. Polyps can grow in many places, including the colon.  Most colon polyps are noncancerous (benign), but some can become cancerous over time.  This condition is diagnosed with a colonoscopy.  Treatment for this condition involves removing any polyps that are found. Most polyps  can be removed during a colonoscopy. This information is not intended to replace advice given to you by your health care provider. Make sure you discuss any questions you have with your health care provider. Document Released: 05/11/2004 Document Revised: 11/30/2017 Document Reviewed: 11/30/2017 Elsevier Interactive Patient Education  2019 [employer].  Diverticulosis  Diverticulosis is a condition that develops when small pouches (diverticula) form in the wall of the large intestine (colon). The colon is where water is absorbed and stool is formed. The pouches form when the inside layer of the colon pushes through weak spots in the outer layers of the colon. You may have a few pouches or many of them. What are the causes? The cause of this condition is not known. What increases the risk? The following factors may make you more likely to develop this condition:  Being older than age 17. Your risk for this condition increases with age. Diverticulosis is rare among people younger than age 28. By age 40, many people have  it.  Eating a low-fiber diet.  Having frequent constipation.  Being overweight.  Not getting enough exercise.  Smoking.  Taking over-the-counter pain medicines, like aspirin and ibuprofen.  Having a family history of diverticulosis. What are the signs or symptoms? In most people, there are no symptoms of this condition. If you do have symptoms, they may include:  Bloating.  Cramps in the abdomen.  Constipation or diarrhea.  Pain in the lower left side of the abdomen. How is this diagnosed? This condition is most often diagnosed during an exam for other colon problems. Because diverticulosis usually has no symptoms, it often cannot be diagnosed independently. This condition may be diagnosed by:  Using a flexible scope to examine the colon (colonoscopy).  Taking an X-ray of the colon after dye has been put into the colon (barium enema).  Doing a CT scan. How is this treated? You may not need treatment for this condition if you have never developed an infection related to diverticulosis. If you have had an infection before, treatment may include:  Eating a high-fiber diet. This may include eating more fruits, vegetables, and grains.  Taking a fiber supplement.  Taking a live bacteria supplement (probiotic).  Taking medicine to relax your colon.  Taking antibiotic medicines. Follow these instructions at home:  Drink 6-8 glasses of water or more each day to prevent constipation.  Try not to strain when you have a bowel movement.  If you have had an infection before: ? Eat more fiber as directed by your health care provider or your diet and nutrition specialist (dietitian). ? Take a fiber supplement or probiotic, if your health care provider approves.  Take over-the-counter and prescription medicines only as told by your health care provider.  If you were prescribed an antibiotic, take it as told by your health care provider. Do not stop taking the antibiotic even  if you start to feel better.  Keep all follow-up visits as told by your health care provider. This is important. Contact a health care provider if:  You have pain in your abdomen.  You have bloating.  You have cramps.  You have not had a bowel movement in 3 days. Get help right away if:  Your pain gets worse.  Your bloating becomes very bad.  You have a fever or chills, and your symptoms suddenly get worse.  You vomit.  You have bowel movements that are bloody or black.  You have bleeding from your rectum. Summary  Diverticulosis is a condition that develops when small pouches (diverticula) form in the wall of the large intestine (colon).  You may have a few pouches or many of them.  This condition is most often diagnosed during an exam for other colon problems.  If you have had an infection related to diverticulosis, treatment may include increasing the fiber in your diet, taking supplements, or taking medicines. This information is not intended to replace advice given to you by your health care provider. Make sure you discuss any questions you have with your health care provider. Document Released: 05/12/2004 Document Revised: 07/04/2016 Document Reviewed: 07/04/2016 Elsevier Interactive Patient Education  2019 [employer].  Hemorrhoids Hemorrhoids are swollen veins in and around the rectum or anus. There are two types of hemorrhoids:  Internal hemorrhoids. These occur in the veins that are just inside the rectum. They may poke through to the outside and become irritated and painful.  External hemorrhoids. These occur in the veins that are outside the anus and can be felt as a painful swelling or hard lump near the anus. Most hemorrhoids do not cause serious problems, and they can be managed with home treatments such as diet and lifestyle changes. If home treatments do not help the symptoms, procedures can be done to shrink or remove the hemorrhoids. What are the  causes? This condition is caused by increased pressure in the anal area. This pressure may result from various things, including:  Constipation.  Straining to have a bowel movement.  Diarrhea.  Pregnancy.  Obesity.  Sitting for long periods of time.  Heavy lifting or other activity that causes you to strain.  Anal sex.  Riding a bike for a long period of time. What are the signs or symptoms? Symptoms of this condition include:  Pain.  Anal itching or irritation.  Rectal bleeding.  Leakage of stool (feces).  Anal swelling.  One or more lumps around the anus. How is this diagnosed? This condition can often be diagnosed through a visual exam. Other exams or tests may also be done, such as:  An exam that involves feeling the rectal area with a gloved hand (digital rectal exam).  An exam of the anal canal that is done using a small tube (anoscope).  A blood test, if you have lost a significant amount of blood.  A test to look inside the colon using a flexible tube with a camera on the end (sigmoidoscopy or colonoscopy). How is this treated? This condition can usually be treated at home. However, various procedures may be done if dietary changes, lifestyle changes, and other home treatments do not help your symptoms. These procedures can help make the hemorrhoids smaller or remove them completely. Some of these procedures involve surgery, and others do not. Common procedures include:  Rubber band ligation. Rubber bands are placed at the base of the hemorrhoids to cut off their blood supply.  Sclerotherapy. Medicine is injected into the hemorrhoids to shrink them.  Infrared coagulation. A type of light energy is used to get rid of the hemorrhoids.  Hemorrhoidectomy surgery. The hemorrhoids are surgically removed, and the veins that supply them are tied off.  Stapled hemorrhoidopexy surgery. The surgeon staples the base of the hemorrhoid to the rectal wall. Follow  these instructions at home: Eating and drinking   Eat foods that have a lot of fiber in them, such as whole grains, beans, nuts, fruits, and vegetables.  Ask your health care provider about taking products that have added  fiber (fiber supplements).  Reduce the amount of fat in your diet. You can do this by eating low-fat dairy products, eating less red meat, and avoiding processed foods.  Drink enough fluid to keep your urine pale yellow. Managing pain and swelling   Take warm sitz baths for 20 minutes, 3-4 times a day to ease pain and discomfort. You may do this in a bathtub or using a portable sitz bath that fits over the toilet.  If directed, apply ice to the affected area. Using ice packs between sitz baths may be helpful. ? Put ice in a plastic bag. ? Place a towel between your skin and the bag. ? Leave the ice on for 20 minutes, 2-3 times a day. General instructions  Take over-the-counter and prescription medicines only as told by your health care provider.  Use medicated creams or suppositories as told.  Get regular exercise. Ask your health care provider how much and what kind of exercise is best for you. In general, you should do moderate exercise for at least 30 minutes on most days of the week (150 minutes each week). This can include activities such as walking, biking, or yoga.  Go to the bathroom when you have the urge to have a bowel movement. Do not wait.  Avoid straining to have bowel movements.  Keep the anal area dry and clean. Use wet toilet paper or moist towelettes after a bowel movement.  Do not sit on the toilet for long periods of time. This increases blood pooling and pain.  Keep all follow-up visits as told by your health care provider. This is important. Contact a health care provider if you have:  Increasing pain and swelling that are not controlled by treatment or medicine.  Difficulty having a bowel movement, or you are unable to have a bowel  movement.  Pain or inflammation outside the area of the hemorrhoids. Get help right away if you have:  Uncontrolled bleeding from your rectum. Summary  Hemorrhoids are swollen veins in and around the rectum or anus.  Most hemorrhoids can be managed with home treatments such as diet and lifestyle changes.  Taking warm sitz baths can help ease pain and discomfort.  In severe cases, procedures or surgery can be done to shrink or remove the hemorrhoids. This information is not intended to replace advice given to you by your health care provider. Make sure you discuss any questions you have with your health care provider. Document Released: 08/12/2000 Document Revised: 01/04/2018 Document Reviewed: 01/04/2018 Elsevier Interactive Patient Education  2019 [employer].

## 2018-10-22 ENCOUNTER — Encounter (HOSPITAL_COMMUNITY): Payer: Self-pay | Admitting: Internal Medicine

## 2018-10-23 DIAGNOSIS — M9905 Segmental and somatic dysfunction of pelvic region: Secondary | ICD-10-CM | POA: Diagnosis not present

## 2018-10-23 DIAGNOSIS — M9904 Segmental and somatic dysfunction of sacral region: Secondary | ICD-10-CM | POA: Diagnosis not present

## 2018-10-23 DIAGNOSIS — M5137 Other intervertebral disc degeneration, lumbosacral region: Secondary | ICD-10-CM | POA: Diagnosis not present

## 2018-10-23 DIAGNOSIS — M9903 Segmental and somatic dysfunction of lumbar region: Secondary | ICD-10-CM | POA: Diagnosis not present

## 2018-10-25 DIAGNOSIS — M9905 Segmental and somatic dysfunction of pelvic region: Secondary | ICD-10-CM | POA: Diagnosis not present

## 2018-10-25 DIAGNOSIS — M9904 Segmental and somatic dysfunction of sacral region: Secondary | ICD-10-CM | POA: Diagnosis not present

## 2018-10-25 DIAGNOSIS — M9903 Segmental and somatic dysfunction of lumbar region: Secondary | ICD-10-CM | POA: Diagnosis not present

## 2018-10-25 DIAGNOSIS — M5137 Other intervertebral disc degeneration, lumbosacral region: Secondary | ICD-10-CM | POA: Diagnosis not present

## 2018-11-14 ENCOUNTER — Other Ambulatory Visit: Payer: Self-pay | Admitting: *Deleted

## 2018-11-14 MED ORDER — EZETIMIBE 10 MG PO TABS: 10.0000 mg | ORAL_TABLET | Freq: Every day | ORAL | 0 refills | Status: DC

## 2018-12-04 ENCOUNTER — Ambulatory Visit (INDEPENDENT_AMBULATORY_CARE_PROVIDER_SITE_OTHER): Payer: Medicare Other | Admitting: Family Medicine

## 2018-12-04 ENCOUNTER — Other Ambulatory Visit: Payer: Self-pay

## 2018-12-04 ENCOUNTER — Encounter: Payer: Self-pay | Admitting: Family Medicine

## 2018-12-04 DIAGNOSIS — E782 Mixed hyperlipidemia: Secondary | ICD-10-CM

## 2018-12-04 DIAGNOSIS — I10 Essential (primary) hypertension: Secondary | ICD-10-CM | POA: Diagnosis not present

## 2018-12-04 MED ORDER — LISINOPRIL-HYDROCHLOROTHIAZIDE 20-25 MG PO TABS: 1.0000 | ORAL_TABLET | Freq: Every day | ORAL | 0 refills | Status: DC

## 2018-12-04 MED ORDER — EZETIMIBE 10 MG PO TABS: 10.0000 mg | ORAL_TABLET | Freq: Every day | ORAL | 0 refills | Status: DC

## 2018-12-04 NOTE — Progress Notes (Signed)
Virtual Visit via telephone Note Due to COVID-19, visit is conducted virtually and was requested by patient.  I connected with Erin Khan on 12/04/18 at 1400 by telephone and verified that I am speaking with the correct person using two identifiers. [name] is currently located at home and family is currently with them during visit. The provider, Monia Pouch, FNP is located in their office at time of visit.  I discussed the limitations, risks, security and privacy concerns of performing an evaluation and management service by telephone and the availability of in person appointments. I also discussed with the patient that there may be a patient responsible charge related to this service. The patient expressed understanding and agreed to proceed.  Subjective:  Patient ID: Erin Khan, female    DOB: [birth year], 78 y.o.   MRN: [id number]  Chief Complaint:  Medical Management of Chronic Issues   HPI: Erin Khan is a 77 y.o. female presenting on 12/04/2018 for Medical Management of Chronic Issues   Pt reports she needs refills on her blood pressure and cholesterol medications. Pt states she is due for blood work but does not want to be out in public due to [id number]-[id number]. Pt states she is trying to diet and exercise. States she takes her medications as prescribed. States she tolerates them well, no adverse side effects. Denies chest pain, headache, shortness of breath, dizziness, visual changes, or swelling in legs. No abdominal pain or myalgias. States she is doing well. States her blood pressure runs 138/84 usually. No other complaints or concerns.    Relevant past medical, surgical, family, and social history reviewed and updated as indicated.  Allergies and medications reviewed and updated.   Past Medical History:  Diagnosis Date  . Cataract   . Colon polyps   . Hypercholesteremia   . Hypertension     Past Surgical History:  Procedure Laterality Date  . ABDOMINAL HYSTERECTOMY     . BILATERAL OOPHORECTOMY    . CATARACT EXTRACTION, BILATERAL  2014  . CHOLECYSTECTOMY  1980  . COLONOSCOPY    . COLONOSCOPY N/A 05/15/2013   Procedure: COLONOSCOPY;  Surgeon: Rogene Houston, MD;  Location: [facility];  Service: Endoscopy;  Laterality: N/A;  930  . COLONOSCOPY N/A 10/18/2018   Procedure: COLONOSCOPY;  Surgeon: Rogene Houston, MD;  Location: [facility];  Service: Endoscopy;  Laterality: N/A;  830  . cranial shunt     17 years ago  . POLYPECTOMY  10/18/2018   Procedure: POLYPECTOMY;  Surgeon: Rogene Houston, MD;  Location: [facility];  Service: Endoscopy;;  colon    Social History   Socioeconomic History  . Marital status: Married    Spouse name: Not on file  . Number of children: 2  . Years of education: 43  . Highest education level: 12th grade  Occupational History  . Occupation: Retired    Comment: Environmental health practitioner  . Occupation: [employer]: [employer]    Comment: 20 to 25 hours a week part time  Social Needs  . Financial resource strain: Not hard at all  . Food insecurity:    Worry: Never true    Inability: Never true  . Transportation needs:    Medical: No    Non-medical: No  Tobacco Use  . Smoking status: Never Smoker  . Smokeless tobacco: Never Used  Substance and Sexual Activity  . Alcohol use: No  . Drug use: No  . Sexual  activity: Yes  Lifestyle  . Physical activity:    Days per week: 3 days    Minutes per session: 90 min  . Stress: Not at all  Relationships  . Social connections:    Talks on phone: More than three times a week    Gets together: More than three times a week    Attends religious service: More than 4 times per year    Active member of club or organization: Yes    Attends meetings of clubs or organizations: More than 4 times per year    Relationship status: Married  . Intimate partner violence:    Fear of current or ex partner: No    Emotionally abused: No    Physically abused: No    Forced sexual  activity: Not on file  Other Topics Concern  . Not on file  Social History Narrative  . Not on file    Outpatient Encounter Medications as of 12/04/2018  Medication Sig  . Calcium Carbonate-Vitamin D (CALCIUM 600+D PO) Take 1 tablet by mouth daily.  . Ergocalciferol (VITAMIN D2) 10 MCG (400 UNIT) TABS Take 400 Units by mouth daily.  . Estradiol 0.25 NU/2.72ZD GEL Place 1 Applicatorful vaginally 3 (three) times a week.  . ezetimibe (ZETIA) 10 MG tablet Take 1 tablet (10 mg total) by mouth daily. (Needs to be seen)  . lisinopril-hydrochlorothiazide (PRINZIDE,ZESTORETIC) 20-25 MG tablet Take 1 tablet by mouth daily. (Needs to be seen)  . vitamin B-12 (CYANOCOBALAMIN) 500 MCG tablet Take 500 mcg by mouth daily.   . [DISCONTINUED] ezetimibe (ZETIA) 10 MG tablet Take 1 tablet (10 mg total) by mouth daily. (Needs to be seen)  . [DISCONTINUED] lisinopril-hydrochlorothiazide (PRINZIDE,ZESTORETIC) 20-25 MG tablet Take 1 tablet by mouth daily. (Needs to be seen)   No facility-administered encounter medications on file as of 12/04/2018.     Allergies  Allergen Reactions  . Asa [Aspirin] Hives and Swelling    Review of Systems  Constitutional: Negative for chills, fatigue, fever and unexpected weight change.  Eyes: Negative for photophobia and visual disturbance.  Respiratory: Negative for cough, shortness of breath and wheezing.   Cardiovascular: Negative for chest pain, palpitations and leg swelling.  Gastrointestinal: Negative for abdominal pain, constipation, diarrhea, nausea and vomiting.  Endocrine: Negative for cold intolerance, heat intolerance, polydipsia, polyphagia and polyuria.  Genitourinary: Negative for decreased urine volume and difficulty urinating.  Musculoskeletal: Negative for arthralgias, joint swelling and myalgias.  Neurological: Negative for dizziness, tremors, seizures, syncope, facial asymmetry, speech difficulty, weakness, light-headedness, numbness and headaches.   Psychiatric/Behavioral: Negative for confusion.  All other systems reviewed and are negative.        Observations/Objective: No vital signs or physical exam, this was a telephone or virtual health encounter.  Pt alert and oriented, answers all questions appropriately, and able to speak in full sentences.    Assessment and Plan: Vernel was seen today for medical management of chronic issues.  Diagnoses and all orders for this visit:  Essential hypertension Will schedule an appointment for blood work when COVID-19 is less prominent. Continue medications as prescribed. Report any persistent high or low blood pressure readings. DASH diet.  -     lisinopril-hydrochlorothiazide (PRINZIDE,ZESTORETIC) 20-25 MG tablet; Take 1 tablet by mouth daily. (Needs to be seen)  Mixed hyperlipidemia Diet and exercise encouraged. Medications as prescribed.  -     ezetimibe (ZETIA) 10 MG tablet; Take 1 tablet (10 mg total) by mouth daily. (Needs to be seen)  Follow Up Instructions: Return in about 2 months (around 02/03/2019), or if symptoms worsen or fail to improve, for HTN, labs.    I discussed the assessment and treatment plan with the patient. The patient was provided an opportunity to ask questions and all were answered. The patient agreed with the plan and demonstrated an understanding of the instructions.   The patient was advised to call back or seek an in-person evaluation if the symptoms worsen or if the condition fails to improve as anticipated.  The above assessment and management plan was discussed with the patient. The patient verbalized understanding of and has agreed to the management plan. Patient is aware to call the clinic if symptoms persist or worsen. Patient is aware when to return to the clinic for a follow-up visit. Patient educated on when it is appropriate to go to the emergency department.    I provided 15 minutes of non-face-to-face time during this encounter. The call  started at 1400. The call ended at 1415.   Monia Pouch, FNP-C [facility] [address] [place], [place] [address] [phone]

## 2019-01-10 DIAGNOSIS — Z960 Presence of urogenital implants: Secondary | ICD-10-CM | POA: Diagnosis not present

## 2019-02-21 DIAGNOSIS — H524 Presbyopia: Secondary | ICD-10-CM | POA: Diagnosis not present

## 2019-05-07 ENCOUNTER — Other Ambulatory Visit: Payer: Self-pay | Admitting: Family Medicine

## 2019-05-07 DIAGNOSIS — I10 Essential (primary) hypertension: Secondary | ICD-10-CM

## 2019-05-07 MED ORDER — LISINOPRIL-HYDROCHLOROTHIAZIDE 20-25 MG PO TABS: 1.0000 | ORAL_TABLET | Freq: Every day | ORAL | 0 refills | Status: DC

## 2019-05-07 NOTE — Telephone Encounter (Signed)
What is the name of the medication? Lisinopril /HCTZ Tab 20-25mg   Have you contacted your pharmacy to request a refill? NO  Which pharmacy would you like this sent to? [facility]   Patient notified that their request is being sent to the clinical staff for review and that they should receive a call once it is complete. If they do not receive a call within 24 hours they can check with their pharmacy or our office.

## 2019-05-07 NOTE — Telephone Encounter (Signed)
LMOVM 30 day supply sent to [facility]

## 2019-05-08 ENCOUNTER — Other Ambulatory Visit (HOSPITAL_COMMUNITY): Payer: Self-pay | Admitting: Obstetrics and Gynecology

## 2019-05-08 DIAGNOSIS — Z1231 Encounter for screening mammogram for malignant neoplasm of breast: Secondary | ICD-10-CM

## 2019-05-13 ENCOUNTER — Encounter: Payer: Self-pay | Admitting: Family Medicine

## 2019-05-13 ENCOUNTER — Ambulatory Visit (INDEPENDENT_AMBULATORY_CARE_PROVIDER_SITE_OTHER): Payer: Medicare Other | Admitting: Family Medicine

## 2019-05-13 ENCOUNTER — Other Ambulatory Visit: Payer: Self-pay

## 2019-05-13 DIAGNOSIS — I10 Essential (primary) hypertension: Secondary | ICD-10-CM | POA: Diagnosis not present

## 2019-05-13 NOTE — Progress Notes (Signed)
Virtual Visit via telephone Note Due to COVID-19 pandemic this visit was conducted virtually. This visit type was conducted due to national recommendations for restrictions regarding the COVID-19 Pandemic (e.g. social distancing, sheltering in place) in an effort to limit this patient's exposure and mitigate transmission in our community. All issues noted in this document were discussed and addressed.  A physical exam was not performed with this format.   I connected with [name] on 05/13/19 at 0815 by telephone and verified that I am speaking with the correct person using two identifiers. [name] is currently located at home and family is currently with them during visit. The provider, Monia Pouch, FNP is located in their office at time of visit.  I discussed the limitations, risks, security and privacy concerns of performing an evaluation and management service by telephone and the availability of in person appointments. I also discussed with the patient that there may be a patient responsible charge related to this service. The patient expressed understanding and agreed to proceed.  Subjective:  Patient ID: Erin Khan, female    DOB: 14-Jun-1941, 78 y.o.   MRN: [id number]  Chief Complaint:  Hypertension   HPI: Erin Khan is a 78 y.o. female presenting on 05/13/2019 for Hypertension   Hypertension This is a chronic problem. The current episode started more than 1 year ago. The problem is unchanged. The problem is controlled. Pertinent negatives include no anxiety, blurred vision, chest pain, headaches, malaise/fatigue, neck pain, orthopnea, palpitations, peripheral edema, PND, shortness of breath or sweats. Agents associated with hypertension include estrogens. Past treatments include ACE inhibitors and diuretics. The current treatment provides significant improvement. Compliance problems: follow ups in office, labs.  Hypertensive end-organ damage includes kidney disease (last GFR  greatly declined. No labs since 01/2018. Pt aware this needs to checked within the next 2 weeks.).     Relevant past medical, surgical, family, and social history reviewed and updated as indicated.  Allergies and medications reviewed and updated.   Past Medical History:  Diagnosis Date  . Cataract   . Colon polyps   . Hypercholesteremia   . Hypertension     Past Surgical History:  Procedure Laterality Date  . ABDOMINAL HYSTERECTOMY    . BILATERAL OOPHORECTOMY    . CATARACT EXTRACTION, BILATERAL  2014  . CHOLECYSTECTOMY  1980  . COLONOSCOPY    . COLONOSCOPY N/A 05/15/2013   Procedure: COLONOSCOPY;  Surgeon: Rogene Houston, MD;  Location: [facility];  Service: Endoscopy;  Laterality: N/A;  930  . COLONOSCOPY N/A 10/18/2018   Procedure: COLONOSCOPY;  Surgeon: Rogene Houston, MD;  Location: [facility];  Service: Endoscopy;  Laterality: N/A;  830  . cranial shunt     17 years ago  . POLYPECTOMY  10/18/2018   Procedure: POLYPECTOMY;  Surgeon: Rogene Houston, MD;  Location: [facility];  Service: Endoscopy;;  colon    Social History   Socioeconomic History  . Marital status: Married    Spouse name: Not on file  . Number of children: 2  . Years of education: 14  . Highest education level: 12th grade  Occupational History  . Occupation: Retired    Comment: Environmental health practitioner  . Occupation: [employer]: [employer]    Comment: 20 to 25 hours a week part time  Social Needs  . Financial resource strain: Not hard at all  . Food insecurity    Worry: Never true  Inability: Never true  . Transportation needs    Medical: No    Non-medical: No  Tobacco Use  . Smoking status: Never Smoker  . Smokeless tobacco: Never Used  Substance and Sexual Activity  . Alcohol use: No  . Drug use: No  . Sexual activity: Yes  Lifestyle  . Physical activity    Days per week: 3 days    Minutes per session: 90 min  . Stress: Not at all  Relationships  . Social  connections    Talks on phone: More than three times a week    Gets together: More than three times a week    Attends religious service: More than 4 times per year    Active member of club or organization: Yes    Attends meetings of clubs or organizations: More than 4 times per year    Relationship status: Married  . Intimate partner violence    Fear of current or ex partner: No    Emotionally abused: No    Physically abused: No    Forced sexual activity: Not on file  Other Topics Concern  . Not on file  Social History Narrative  . Not on file    Outpatient Encounter Medications as of 05/13/2019  Medication Sig  . cetirizine (ZYRTEC) 10 MG tablet Take by mouth.  . Calcium Carbonate-Vitamin D (CALCIUM 600+D PO) Take 1 tablet by mouth daily.  . Ergocalciferol (VITAMIN D2) 10 MCG (400 UNIT) TABS Take 400 Units by mouth daily.  . Estradiol 0.25 0000000 GEL Place 1 Applicatorful vaginally 3 (three) times a week.  . ezetimibe (ZETIA) 10 MG tablet Take 1 tablet (10 mg total) by mouth daily. (Needs to be seen)  . lisinopril-hydrochlorothiazide (ZESTORETIC) 20-25 MG tablet Take 1 tablet by mouth daily. (Needs to be seen)  . Oxyquinoline-Sod Lauryl Sulf (TRIMO-SAN) 0.025-0.01 % GEL 1 g.  . pneumococcal 13-valent conjugate vaccine (PREVNAR 13) SUSP injection Prevnar 13 (PF) 0.5 mL intramuscular syringe  . pneumococcal 23 valent vaccine (PNEUMOVAX 23) 25 MCG/0.5ML injection Pneumovax-23 25 mcg/0.5 mL injection syringe  . vitamin B-12 (CYANOCOBALAMIN) 500 MCG tablet Take 500 mcg by mouth daily.    No facility-administered encounter medications on file as of 05/13/2019.     Allergies  Allergen Reactions  . Asa [Aspirin] Hives and Swelling    Review of Systems  Constitutional: Negative for activity change, appetite change, chills, diaphoresis, fatigue, fever, malaise/fatigue and unexpected weight change.  HENT: Negative.   Eyes: Negative.  Negative for blurred vision, photophobia and  visual disturbance.  Respiratory: Negative for cough, chest tightness and shortness of breath.   Cardiovascular: Negative for chest pain, palpitations, orthopnea, leg swelling and PND.  Gastrointestinal: Negative for abdominal pain, anal bleeding, blood in stool, constipation, diarrhea, nausea and vomiting.  Endocrine: Negative.  Negative for cold intolerance, heat intolerance, polydipsia, polyphagia and polyuria.  Genitourinary: Negative for decreased urine volume, difficulty urinating, dysuria, frequency and urgency.  Musculoskeletal: Negative for arthralgias, myalgias and neck pain.  Skin: Negative.   Allergic/Immunologic: Negative.   Neurological: Negative for tremors, seizures, syncope, facial asymmetry, speech difficulty, weakness, light-headedness, numbness and headaches.  Hematological: Negative.   Psychiatric/Behavioral: Negative for confusion, hallucinations, sleep disturbance and suicidal ideas.  All other systems reviewed and are negative.        Observations/Objective: No vital signs or physical exam, this was a telephone or virtual health encounter.  Pt alert and oriented, answers all questions appropriately, and able to speak in full sentences.  Assessment and Plan: Aqua was seen today for hypertension.  Diagnoses and all orders for this visit:  Essential hypertension Recently refilled HTN medications. Pt needs to be seen in office for labs, last blood work completed in June 2019 and GFR had greatly declined. Pt aware she will need labs completed before additional refills can be provided. Pt verbalized understanding and will make appointment. Pt wished a Happy Rudene Anda, as her birthday is tomorrow.     Follow Up Instructions: Return in about 2 weeks (around 05/27/2019), or if symptoms worsen or fail to improve, for HTN, blood work, flu shot.    I discussed the assessment and treatment plan with the patient. The patient was provided an opportunity to ask questions  and all were answered. The patient agreed with the plan and demonstrated an understanding of the instructions.   The patient was advised to call back or seek an in-person evaluation if the symptoms worsen or if the condition fails to improve as anticipated.  The above assessment and management plan was discussed with the patient. The patient verbalized understanding of and has agreed to the management plan. Patient is aware to call the clinic if symptoms persist or worsen. Patient is aware when to return to the clinic for a follow-up visit. Patient educated on when it is appropriate to go to the emergency department.    I provided 15 minutes of non-face-to-face time during this encounter. The call started at 0815. The call ended at 0830. The other time was used for coordination of care.    Monia Pouch, FNP-C [facility] [address] [place],  [address] [phone] 05/13/19

## 2019-05-14 ENCOUNTER — Other Ambulatory Visit: Payer: Medicare Other

## 2019-05-14 DIAGNOSIS — I10 Essential (primary) hypertension: Secondary | ICD-10-CM | POA: Diagnosis not present

## 2019-05-14 DIAGNOSIS — E782 Mixed hyperlipidemia: Secondary | ICD-10-CM | POA: Diagnosis not present

## 2019-05-14 DIAGNOSIS — N183 Chronic kidney disease, stage 3 unspecified: Secondary | ICD-10-CM

## 2019-05-15 LAB — CBC WITH DIFFERENTIAL/PLATELET
Basophils Absolute: 0.1 10*3/uL (ref 0.0–0.2)
Basos: 2 %
EOS (ABSOLUTE): 0.1 10*3/uL (ref 0.0–0.4)
Eos: 2 %
Hematocrit: 36 % (ref 34.0–46.6)
Hemoglobin: 12.3 g/dL (ref 11.1–15.9)
Immature Grans (Abs): 0 10*3/uL (ref 0.0–0.1)
Immature Granulocytes: 1 %
Lymphocytes Absolute: 1.5 10*3/uL (ref 0.7–3.1)
Lymphs: 24 %
MCH: 30.8 pg (ref 26.6–33.0)
MCHC: 34.2 g/dL (ref 31.5–35.7)
MCV: 90 fL (ref 79–97)
Monocytes Absolute: 0.8 10*3/uL (ref 0.1–0.9)
Monocytes: 13 %
Neutrophils Absolute: 3.6 10*3/uL (ref 1.4–7.0)
Neutrophils: 58 %
Platelets: 187 10*3/uL (ref 150–450)
RBC: 4 x10E6/uL (ref 3.77–5.28)
RDW: 12.3 % (ref 11.7–15.4)
WBC: 6.2 10*3/uL (ref 3.4–10.8)

## 2019-05-15 LAB — LIPID PANEL
Chol/HDL Ratio: 7.3 ratio — ABNORMAL HIGH (ref 0.0–4.4)
Cholesterol, Total: 234 mg/dL — ABNORMAL HIGH (ref 100–199)
HDL: 32 mg/dL — ABNORMAL LOW (ref >39)
LDL Chol Calc (NIH): 120 mg/dL — ABNORMAL HIGH (ref 0–99)
Triglycerides: 460 mg/dL — ABNORMAL HIGH (ref 0–149)
VLDL Cholesterol Cal: 82 mg/dL — ABNORMAL HIGH (ref 5–40)

## 2019-05-15 LAB — CMP14+EGFR
ALT: 11 IU/L (ref 0–32)
AST: 9 IU/L (ref 0–40)
Albumin/Globulin Ratio: 1.6 (ref 1.2–2.2)
Albumin: 4.2 g/dL (ref 3.7–4.7)
Alkaline Phosphatase: 91 IU/L (ref 39–117)
BUN/Creatinine Ratio: 21 (ref 12–28)
BUN: 32 mg/dL — ABNORMAL HIGH (ref 8–27)
Bilirubin Total: 0.3 mg/dL (ref 0.0–1.2)
CO2: 24 mmol/L (ref 20–29)
Calcium: 9.1 mg/dL (ref 8.7–10.3)
Chloride: 101 mmol/L (ref 96–106)
Creatinine, Ser: 1.54 mg/dL — ABNORMAL HIGH (ref 0.57–1.00)
GFR calc Af Amer: 37 mL/min/{1.73_m2} — ABNORMAL LOW (ref >59)
GFR calc non Af Amer: 32 mL/min/{1.73_m2} — ABNORMAL LOW (ref >59)
Globulin, Total: 2.6 g/dL (ref 1.5–4.5)
Glucose: 96 mg/dL (ref 65–99)
Potassium: 4.2 mmol/L (ref 3.5–5.2)
Sodium: 141 mmol/L (ref 134–144)
Total Protein: 6.8 g/dL (ref 6.0–8.5)

## 2019-05-21 ENCOUNTER — Ambulatory Visit (INDEPENDENT_AMBULATORY_CARE_PROVIDER_SITE_OTHER): Payer: Medicare Other | Admitting: *Deleted

## 2019-05-21 ENCOUNTER — Other Ambulatory Visit: Payer: Self-pay

## 2019-05-21 DIAGNOSIS — Z0000 Encounter for general adult medical examination without abnormal findings: Secondary | ICD-10-CM

## 2019-05-21 NOTE — Patient Instructions (Signed)
Preventive Care 77 Years and Older, Female Preventive care refers to lifestyle choices and visits with your health care provider that can promote health and wellness. This includes:  A yearly physical exam. This is also called an annual well check.  Regular dental and eye exams.  Immunizations.  Screening for certain conditions.  Healthy lifestyle choices, such as diet and exercise. What can I expect for my preventive care visit? Physical exam Your health care provider will check:  Height and weight. These may be used to calculate body mass index (BMI), which is a measurement that tells if you are at a healthy weight.  Heart rate and blood pressure.  Your skin for abnormal spots. Counseling Your health care provider may ask you questions about:  Alcohol, tobacco, and drug use.  Emotional well-being.  Home and relationship well-being.  Sexual activity.  Eating habits.  History of falls.  Memory and ability to understand (cognition).  Work and work Statistician.  Pregnancy and menstrual history. What immunizations do I need?  Influenza (flu) vaccine  This is recommended every year. Tetanus, diphtheria, and pertussis (Tdap) vaccine  You may need a Td booster every 10 years. Varicella (chickenpox) vaccine  You may need this vaccine if you have not already been vaccinated. Zoster (shingles) vaccine  You may need this after age 33. Pneumococcal conjugate (PCV13) vaccine  One dose is recommended after age 33. Pneumococcal polysaccharide (PPSV23) vaccine  One dose is recommended after age 72. Measles, mumps, and rubella (MMR) vaccine  You may need at least one dose of MMR if you were born in 1957 or later. You may also need a second dose. Meningococcal conjugate (MenACWY) vaccine  You may need this if you have certain conditions. Hepatitis A vaccine  You may need this if you have certain conditions or if you travel or work in places where you may be exposed  to hepatitis A. Hepatitis B vaccine  You may need this if you have certain conditions or if you travel or work in places where you may be exposed to hepatitis B. Haemophilus influenzae type b (Hib) vaccine  You may need this if you have certain conditions. You may receive vaccines as individual doses or as more than one vaccine together in one shot (combination vaccines). Talk with your health care provider about the risks and benefits of combination vaccines. What tests do I need? Blood tests  Lipid and cholesterol levels. These may be checked every 5 years, or more frequently depending on your overall health.  Hepatitis C test.  Hepatitis B test. Screening  Lung cancer screening. You may have this screening every year starting at age 39 if you have a 30-pack-year history of smoking and currently smoke or have quit within the past 15 years.  Colorectal cancer screening. All adults should have this screening starting at age 36 and continuing until age 15. Your health care provider may recommend screening at age 23 if you are at increased risk. You will have tests every 1-10 years, depending on your results and the type of screening test.  Diabetes screening. This is done by checking your blood sugar (glucose) after you have not eaten for a while (fasting). You may have this done every 1-3 years.  Mammogram. This may be done every 1-2 years. Talk with your health care provider about how often you should have regular mammograms.  BRCA-related cancer screening. This may be done if you have a family history of breast, ovarian, tubal, or peritoneal cancers.  Other tests  Sexually transmitted disease (STD) testing.  Bone density scan. This is done to screen for osteoporosis. You may have this done starting at age 55. Follow these instructions at home: Eating and drinking  Eat a diet that includes fresh fruits and vegetables, whole grains, lean protein, and low-fat dairy products. Limit  your intake of foods with high amounts of sugar, saturated fats, and salt.  Take vitamin and mineral supplements as recommended by your health care provider.  Do not drink alcohol if your health care provider tells you not to drink.  If you drink alcohol: ? Limit how much you have to 0-1 drink a day. ? Be aware of how much alcohol is in your drink. In the [place] one drink equals one 12 oz bottle of beer (355 mL), one 5 oz glass of wine (148 mL), or one 1 oz glass of hard liquor (44 mL). Lifestyle  Take daily care of your teeth and gums.  Stay active. Exercise for at least 30 minutes on 5 or more days each week.  Do not use any products that contain nicotine or tobacco, such as cigarettes, e-cigarettes, and chewing tobacco. If you need help quitting, ask your health care provider.  If you are sexually active, practice safe sex. Use a condom or other form of protection in order to prevent STIs (sexually transmitted infections).  Talk with your health care provider about taking a low-dose aspirin or statin. What's next?  Go to your health care provider once a year for a well check visit.  Ask your health care provider how often you should have your eyes and teeth checked.  Stay up to date on all vaccines. This information is not intended to replace advice given to you by your health care provider. Make sure you discuss any questions you have with your health care provider. Document Released: 09/11/2015 Document Revised: 08/09/2018 Document Reviewed: 08/09/2018 Elsevier Patient Education  2020 [employer].

## 2019-05-21 NOTE — Progress Notes (Signed)
MEDICARE ANNUAL WELLNESS VISIT  05/21/2019  Telephone Visit Disclaimer This Medicare AWV was conducted by telephone due to national recommendations for restrictions regarding the COVID-19 Pandemic (e.g. social distancing).  I verified, using two identifiers, that I am speaking with Erin Khan or their authorized healthcare agent. I discussed the limitations, risks, security, and privacy concerns of performing an evaluation and management service by telephone and the potential availability of an in-person appointment in the future. The patient expressed understanding and agreed to proceed.   Subjective:  Erin Khan is a 78 y.o. female patient of Rakes, Connye Burkitt, FNP who had a Medicare Annual Wellness Visit today via telephone. Erin Khan is Retired and lives with their spouse. she has two children. she reports that she is socially active and does interact with friends/family regularly. she is moderately physically active and enjoys brisk walking, working in flowers, gardening and baby sitting her grand children. She has no pets.  Patient Care Team: Baruch Gouty, FNP as PCP - General (Family Medicine) Paula Compton, MD as Consulting Physician (Obstetrics and Gynecology) Rogene Houston, MD as Consulting Physician (Gastroenterology)  Advanced Directives 05/21/2019 10/18/2018 01/15/2018 12/08/2016 05/15/2013  Does Patient Have a Medical Advance Directive? No No No No Patient does not have advance directive;Patient would not like information  Would patient like information on creating a medical advance directive? No - Patient declined No - Patient declined Yes (MAU/Ambulatory/Procedural Areas - Information given) Yes (MAU/Ambulatory/Procedural Areas - Information given) -  Pre-existing out of facility DNR order (yellow form or pink MOST form) - - - - No    Hospital Utilization Over the Past 12 Months: # of hospitalizations or ER visits: 0 # of surgeries: 0  Review of Systems    Patient  reports that her overall health is unchanged compared to last year.  Dermatological ROS: negative N/A none  Patient Reported Readings (BP, Pulse, CBG, Weight, etc) N/A  Pain Assessment Pain : No/denies pain     Current Medications & Allergies (verified) Allergies as of 05/21/2019      Reactions   Asa [aspirin] Hives, Swelling      Medication List       Accurate as of May 21, 2019  2:22 PM. If you have any questions, ask your nurse or doctor.        CALCIUM 600+D PO Take 1 tablet by mouth daily.   cetirizine 10 MG tablet Commonly known as: ZYRTEC Take by mouth.   Estradiol 0.25 0000000 Gel Place 1 Applicatorful vaginally 3 (three) times a week.   ezetimibe 10 MG tablet Commonly known as: ZETIA Take 1 tablet (10 mg total) by mouth daily. (Needs to be seen)   lisinopril-hydrochlorothiazide 20-25 MG tablet Commonly known as: ZESTORETIC Take 1 tablet by mouth daily. (Needs to be seen)   pneumococcal 13-valent conjugate vaccine Susp injection Commonly known as: PREVNAR 13 Prevnar 13 (PF) 0.5 mL intramuscular syringe   Pneumovax 23 25 MCG/0.5ML injection Generic drug: pneumococcal 23 valent vaccine Pneumovax-23 25 mcg/0.5 mL injection syringe   Trimo-San 0.025-0.01 % Gel Generic drug: Oxyquinoline-Sod Lauryl Sulf 1 g.   vitamin B-12 500 MCG tablet Commonly known as: CYANOCOBALAMIN Take 500 mcg by mouth daily.   Vitamin D2 10 MCG (400 UNIT) Tabs Take 400 Units by mouth daily.       History (reviewed): Past Medical History:  Diagnosis Date  . Cataract   . Colon polyps   . Hypercholesteremia   . Hypertension  Past Surgical History:  Procedure Laterality Date  . ABDOMINAL HYSTERECTOMY    . BILATERAL OOPHORECTOMY    . CATARACT EXTRACTION, BILATERAL  2014  . CHOLECYSTECTOMY  1980  . COLONOSCOPY    . COLONOSCOPY N/A 05/15/2013   Procedure: COLONOSCOPY;  Surgeon: Rogene Houston, MD;  [facility];  Service: Endoscopy;   Laterality: N/A;  930  . COLONOSCOPY N/A 10/18/2018   Procedure: COLONOSCOPY;  Surgeon: Rogene Houston, MD;  [facility];  Service: Endoscopy;  Laterality: N/A;  830  . cranial shunt     17 years ago  . POLYPECTOMY  10/18/2018   Procedure: POLYPECTOMY;  Surgeon: Rogene Houston, MD;  [facility];  Service: Endoscopy;;  colon   Family History  Problem Relation Age of Onset  . Lung cancer Sister   . Hypertension Father   . Heart attack Brother   . Arthritis Sister   . Asthma Brother   . Healthy Daughter   . Healthy Son   . Colon cancer Neg Hx    Social History   Socioeconomic History  . Marital status: Married    Spouse name: Collins Scotland  . Number of children: 2  . Years of education: 2  . Highest education level: 12th grade  Occupational History  . Occupation: Retired    Comment: Environmental health practitioner  . Occupation: [employer]: [employer]    Comment: 20 to 25 hours a week part time  Social Needs  . Financial resource strain: Not hard at all  . Food insecurity    Worry: Never true    Inability: Never true  . Transportation needs    Medical: No    Non-medical: No  Tobacco Use  . Smoking status: Never Smoker  . Smokeless tobacco: Never Used  Substance and Sexual Activity  . Alcohol use: No  . Drug use: No  . Sexual activity: Yes  Lifestyle  . Physical activity    Days per week: 3 days    Minutes per session: 90 min  . Stress: Not at all  Relationships  . Social connections    Talks on phone: More than three times a week    Gets together: More than three times a week    Attends religious service: More than 4 times per year    Active member of club or organization: Yes    Attends meetings of clubs or organizations: More than 4 times per year    Relationship status: Married  Other Topics Concern  . Not on file  Social History Narrative   She lives with her husband, Collins Scotland.   She  has eight grandchildren.    Activities of Daily Living  In your present state of health, do you have any difficulty performing the following activities: 05/21/2019 05/21/2019  Hearing? N N  Vision? N N  Comment - wear glasses  Difficulty concentrating or making decisions? N N  Walking or climbing stairs? N N  Dressing or bathing? N N  Doing errands, shopping? N N  Preparing Food and eating ? N -  Using the Toilet? N -  In the past six months, have you accidently leaked urine? N -  Do you have problems with loss of bowel control? N -  Managing your Medications? N -  Housekeeping or managing your Housekeeping? N -  Some recent data might be hidden    Patient Education/ Literacy How often do you need to have someone help  you when you read instructions, pamphlets, or other written materials from your doctor or pharmacy?: 1 - Never What is the last grade level you completed in school?: 12 th  Exercise Current Exercise Habits: Home exercise routine, Type of exercise: walking, Time (Minutes): 30, Frequency (Times/Week): 3, Weekly Exercise (Minutes/Week): 90, Intensity: Moderate, Exercise limited by: None identified  Diet Patient reports consuming 2 meals a day and 2 snack(s) a day Patient reports that her primary diet is: Regular Patient reports that she does have regular access to food.   Depression Screen PHQ 2/9 Scores 05/21/2019 05/30/2018 05/24/2018 02/06/2018 01/15/2018 10/09/2017 07/07/2017  PHQ - 2 Score 0 0 0 0 0 0 0     Fall Risk Fall Risk  05/21/2019 05/30/2018 05/24/2018 02/06/2018 01/15/2018  Falls in the past year? 0 No No No No  Number falls in past yr: - - - - -  Injury with Fall? - - - - -     Objective:  Erin Khan seemed alert and oriented and she participated appropriately during our telephone visit.  Blood Pressure Weight BMI  BP Readings from Last 3 Encounters:  10/18/18 138/72  05/30/18 (!) 160/76  05/24/18 (!) 155/82   Wt Readings from Last 3 Encounters:  10/18/18 150 lb (68 kg)  05/30/18 146 lb (66.2 kg)  05/24/18  150 lb (68 kg)   BMI Readings from Last 1 Encounters:  10/18/18 24.21 kg/m    *Unable to obtain current vital signs, weight, and BMI due to telephone visit type  Hearing/Vision  . Erin Khan did not seem to have difficulty with hearing/understanding during the telephone conversation . Reports that she has had a formal eye exam by an eye care professional within the past year at Dr. Theodosia Blender in [place]. . Reports that she has not had a formal hearing evaluation within the past year *Unable to fully assess hearing and vision during telephone visit type  Cognitive Function: 6CIT Screen 05/21/2019  What Year? 0 points  What month? 0 points  What time? 0 points  Count back from 20 0 points  Months in reverse 0 points  Repeat phrase 0 points  Total Score 0   (Normal:0-7, Significant for Dysfunction: >8)  Normal Cognitive Function Screening: Yes   Immunization & Health Maintenance Record Immunization History  Administered Date(s) Administered  . Influenza, High Dose Seasonal PF 05/30/2017, 06/12/2018  . Influenza,inj,Quad PF,6+ Mos 05/28/2016  . Pneumococcal Conjugate-13 05/29/2014, 07/25/2016  . Pneumococcal Polysaccharide-23 03/22/2016    Health Maintenance  Topic Date Due  . Samul Dada  05/13/1961  . DEXA SCAN  12/09/2018  . INFLUENZA VACCINE  03/30/2019  . PNA vac Low Risk Adult  Completed       Assessment  This is a routine wellness examination for Erin Khan.  Health Maintenance: Due or Overdue Health Maintenance Due  Topic Date Due  . TETANUS/TDAP  05/13/1961  . DEXA SCAN  12/09/2018  . INFLUENZA VACCINE  03/30/2019    Erin Khan does not need a referral for Community Assistance: Care Management:   no Social Work:    no Prescription Assistance:  no Nutrition/Diabetes Education:  no   Plan:  Personalized Goals Goals Addressed            This Visit's Progress   . DIET - INCREASE WATER INTAKE      . Increase physical activity       Walks briskly for  almost a mile three times weekly.  She also works in  the flowers , does house work and tends to grand children to increase physical activity level.  She was encouraged to try to walk more often.    . Prevent falls        Personalized Health Maintenance & Screening Recommendations  Influenza vaccine Td vaccine Shingrix is due.  She is due for  mammogram in October 2020.  Lung Cancer Screening Recommended: no (Low Dose CT Chest recommended if Age 45-80 years, 30 pack-year currently smoking OR have quit w/in past 15 years) Hepatitis C Screening recommended: yes HIV Screening recommended: no  Advanced Directives: Written information was not prepared per patient's request. She has a will.  Referrals & Orders No orders of the defined types were placed in this encounter.   Follow-up Plan . Follow-up with Baruch Gouty, FNP as planned . Schedule for three months with recheck with lab work.    I have personally reviewed and noted the following in the patient's chart:   . Medical and social history . Use of alcohol, tobacco or illicit drugs  . Current medications and supplements . Functional ability and status . Nutritional status . Physical activity . Advanced directives . List of other physicians . Hospitalizations, surgeries, and ER visits in previous 12 months . Vitals . Screenings to include cognitive, depression, and falls . Referrals and appointments  In addition, I have reviewed and discussed with Erin Khan certain preventive protocols, quality metrics, and best practice recommendations. A written personalized care plan for preventive services as well as general preventive health recommendations is available and can be mailed to the patient at her request.      Johnell Comings LPN [id number]

## 2019-05-25 ENCOUNTER — Other Ambulatory Visit: Payer: Self-pay | Admitting: Family Medicine

## 2019-05-25 DIAGNOSIS — I10 Essential (primary) hypertension: Secondary | ICD-10-CM

## 2019-05-25 DIAGNOSIS — E782 Mixed hyperlipidemia: Secondary | ICD-10-CM

## 2019-05-30 ENCOUNTER — Other Ambulatory Visit: Payer: Self-pay

## 2019-06-03 ENCOUNTER — Ambulatory Visit (INDEPENDENT_AMBULATORY_CARE_PROVIDER_SITE_OTHER): Payer: Medicare Other

## 2019-06-03 ENCOUNTER — Other Ambulatory Visit: Payer: Self-pay

## 2019-06-03 DIAGNOSIS — Z23 Encounter for immunization: Secondary | ICD-10-CM | POA: Diagnosis not present

## 2019-06-10 ENCOUNTER — Ambulatory Visit (HOSPITAL_COMMUNITY)
Admission: RE | Admit: 2019-06-10 | Discharge: 2019-06-10 | Disposition: A | Discharge status: Home / Self Care | Payer: Medicare Other | Source: Ambulatory Visit | Attending: Obstetrics and Gynecology | Admitting: Obstetrics and Gynecology

## 2019-06-10 ENCOUNTER — Other Ambulatory Visit: Payer: Self-pay

## 2019-06-10 DIAGNOSIS — Z1231 Encounter for screening mammogram for malignant neoplasm of breast: Secondary | ICD-10-CM | POA: Diagnosis not present

## 2019-07-19 DIAGNOSIS — N8189 Other female genital prolapse: Secondary | ICD-10-CM | POA: Diagnosis not present

## 2019-07-19 DIAGNOSIS — N819 Female genital prolapse, unspecified: Secondary | ICD-10-CM | POA: Diagnosis not present

## 2019-07-19 DIAGNOSIS — Z960 Presence of urogenital implants: Secondary | ICD-10-CM | POA: Diagnosis not present

## 2019-10-10 DIAGNOSIS — H8309 Labyrinthitis, unspecified ear: Secondary | ICD-10-CM | POA: Diagnosis not present

## 2019-10-10 DIAGNOSIS — N819 Female genital prolapse, unspecified: Secondary | ICD-10-CM | POA: Insufficient documentation

## 2019-10-10 DIAGNOSIS — R42 Dizziness and giddiness: Secondary | ICD-10-CM | POA: Diagnosis not present

## 2019-10-15 ENCOUNTER — Telehealth: Payer: Self-pay | Admitting: Family Medicine

## 2019-10-15 NOTE — Chronic Care Management (AMB) (Signed)
  Chronic Care Management   Note  10/15/2019 Name: Erin Khan MRN: [id number] DOB: May 20, 1941  Erin Khan is a 79 y.o. year old female who is a primary care patient of Rakes, Connye Burkitt, FNP. I reached out to [employer] by phone today in response to a referral sent by [name]'s health plan.     [name] was given information about Chronic Care Management services today including:  1. CCM service includes personalized support from designated clinical staff supervised by her physician, including individualized plan of care and coordination with other care providers 2. 24/7 contact phone numbers for assistance for urgent and routine care needs. 3. Service will only be billed when office clinical staff spend 20 minutes or more in a month to coordinate care. 4. Only one practitioner may furnish and bill the service in a calendar month. 5. The patient may stop CCM services at any time (effective at the end of the month) by phone call to the office staff. 6. The patient will be responsible for cost sharing (co-pay) of up to 20% of the service fee (after annual deductible is met).  Patient agreed to services and verbal consent obtained.   Follow up plan: Telephone appointment with care management team member scheduled for:11/11/2019  Noreene Larsson, [place], [place], [place] [address] Direct Dial: [phone] [email or web address] Website: [email or web address]

## 2019-10-18 ENCOUNTER — Encounter: Payer: Self-pay | Admitting: Family Medicine

## 2019-10-18 ENCOUNTER — Ambulatory Visit (INDEPENDENT_AMBULATORY_CARE_PROVIDER_SITE_OTHER): Payer: Medicare Other | Admitting: Family Medicine

## 2019-10-18 DIAGNOSIS — Z982 Presence of cerebrospinal fluid drainage device: Secondary | ICD-10-CM | POA: Diagnosis not present

## 2019-10-18 DIAGNOSIS — R42 Dizziness and giddiness: Secondary | ICD-10-CM | POA: Diagnosis not present

## 2019-10-18 NOTE — Progress Notes (Signed)
Virtual Visit via telephone Note Due to COVID-19 pandemic this visit was conducted virtually. This visit type was conducted due to national recommendations for restrictions regarding the COVID-19 Pandemic (e.g. social distancing, sheltering in place) in an effort to limit this patient's exposure and mitigate transmission in our community. All issues noted in this document were discussed and addressed.  A physical exam was not performed with this format.   I connected with [name] on 10/18/2019 at 1010 by telephone and verified that I am speaking with the correct person using two identifiers. [name] is currently located at home and family is currently with them during visit. The provider, Monia Pouch, FNP is located in their office at time of visit.  I discussed the limitations, risks, security and privacy concerns of performing an evaluation and management service by telephone and the availability of in person appointments. I also discussed with the patient that there may be a patient responsible charge related to this service. The patient expressed understanding and agreed to proceed.  Subjective:  Patient ID: Erin Khan, female    DOB: 23-Nov-1940, 79 y.o.   MRN: [id number]  Chief Complaint:  Dizziness   HPI: Erin Khan is a 79 y.o. female presenting on 10/18/2019 for Dizziness   Pt reports ongoing positional dizziness despite treatment with prednisone and meclizine. She was seen in UC on 10/09/2017 and diagnosed with labyrinthitis and vertigo. She was placed on steroids and meclizine at this time. She reports the steroids seemed to help some but not much. She reports she continues to get dizzy with standing and positional changes. She has minimal nausea with the dizziness. She denies focal deficits, confusion, slurred speech, facial droop, nuchal rigidity, fever, chills, fatigue, or weakness. She does have a VP shunt in place and has not been to see neurology in a long time.    Dizziness This is a recurrent problem. The current episode started 1 to 4 weeks ago. The problem occurs daily. The problem has been unchanged. Associated symptoms include nausea and vertigo. Pertinent negatives include no abdominal pain, anorexia, arthralgias, change in bowel habit, chest pain, chills, congestion, coughing, diaphoresis, fatigue, fever, headaches, joint swelling, myalgias, neck pain, numbness, rash, sore throat, swollen glands, urinary symptoms, visual change, vomiting or weakness. The symptoms are aggravated by bending, standing, walking and twisting. She has tried lying down (prednisone, meclizine) for the symptoms. The treatment provided mild relief.     Relevant past medical, surgical, family, and social history reviewed and updated as indicated.  Allergies and medications reviewed and updated.   Past Medical History:  Diagnosis Date  . Cataract   . Colon polyps   . Hypercholesteremia   . Hypertension     Past Surgical History:  Procedure Laterality Date  . ABDOMINAL HYSTERECTOMY    . BILATERAL OOPHORECTOMY    . CATARACT EXTRACTION, BILATERAL  2014  . CHOLECYSTECTOMY  1980  . COLONOSCOPY    . COLONOSCOPY N/A 05/15/2013   Procedure: COLONOSCOPY;  Surgeon: Rogene Houston, MD;  [facility];  Service: Endoscopy;  Laterality: N/A;  930  . COLONOSCOPY N/A 10/18/2018   Procedure: COLONOSCOPY;  Surgeon: Rogene Houston, MD;  [facility];  Service: Endoscopy;  Laterality: N/A;  830  . cranial shunt     17 years ago  . POLYPECTOMY  10/18/2018   Procedure: POLYPECTOMY;  Surgeon: Rogene Houston, MD;  [facility];  Service: Endoscopy;;  colon  Social History   Socioeconomic History  . Marital status: Married    Spouse name: Collins Scotland  . Number of children: 2  . Years of education: 18  . Highest education level: 12th grade  Occupational History  . Occupation: Retired    Comment: Environmental health practitioner  . Occupation: [employer]     Employer: [employer]    Comment: 20 to 25 hours a week part time  Tobacco Use  . Smoking status: Never Smoker  . Smokeless tobacco: Never Used  Substance and Sexual Activity  . Alcohol use: No  . Drug use: No  . Sexual activity: Yes  Other Topics Concern  . Not on file  Social History Narrative   She lives with her husband, Collins [place].   She  has eight grandchildren.   Social Determinants of Health   Financial Resource Strain:   . Difficulty of Paying Living Expenses: Not on file  Food Insecurity:   . Worried About Charity fundraiser in the Last Year: Not on file  . Ran Out of Food in the Last Year: Not on file  Transportation Needs:   . Lack of Transportation (Medical): Not on file  . Lack of Transportation (Non-Medical): Not on file  Physical Activity:   . Days of Exercise per Week: Not on file  . Minutes of Exercise per Session: Not on file  Stress:   . Feeling of Stress : Not on file  Social Connections:   . Frequency of Communication with Friends and Family: Not on file  . Frequency of Social Gatherings with Friends and Family: Not on file  . Attends Religious Services: Not on file  . Active Member of Clubs or Organizations: Not on file  . Attends Archivist Meetings: Not on file  . Marital Status: Not on file  Intimate Partner Violence:   . Fear of Current or Ex-Partner: Not on file  . Emotionally Abused: Not on file  . Physically Abused: Not on file  . Sexually Abused: Not on file    Outpatient Encounter Medications as of 10/18/2019  Medication Sig  . Calcium Carbonate-Vitamin D (CALCIUM 600+D PO) Take 1 tablet by mouth daily.  . cetirizine (ZYRTEC) 10 MG tablet Take by mouth.  . Ergocalciferol (VITAMIN D2) 10 MCG (400 UNIT) TABS Take 400 Units by mouth daily.  . Estradiol 0.25 0000000 GEL Place 1 Applicatorful vaginally 3 (three) times a week.  . ezetimibe (ZETIA) 10 MG tablet TAKE 1 TABLET BY MOUTH  DAILY  . lisinopril-hydrochlorothiazide (ZESTORETIC)  20-25 MG tablet TAKE 1 TABLET BY MOUTH  DAILY  . Oxyquinoline-Sod Lauryl Sulf (TRIMO-SAN) 0.025-0.01 % GEL 1 g.  . pneumococcal 13-valent conjugate vaccine (PREVNAR 13) SUSP injection Prevnar 13 (PF) 0.5 mL intramuscular syringe  . pneumococcal 23 valent vaccine (PNEUMOVAX 23) 25 MCG/0.5ML injection Pneumovax-23 25 mcg/0.5 mL injection syringe  . vitamin B-12 (CYANOCOBALAMIN) 500 MCG tablet Take 500 mcg by mouth daily.    No facility-administered encounter medications on file as of 10/18/2019.    Allergies  Allergen Reactions  . Asa [Aspirin] Hives and Swelling    Review of Systems  Constitutional: Negative for activity change, appetite change, chills, diaphoresis, fatigue, fever and unexpected weight change.  HENT: Negative.  Negative for congestion and sore throat.   Eyes: Negative.  Negative for photophobia and visual disturbance.  Respiratory: Negative for cough, chest tightness and shortness of breath.   Cardiovascular: Negative for chest pain, palpitations and leg swelling.  Gastrointestinal: Positive for nausea. Negative for  abdominal pain, anorexia, blood in stool, change in bowel habit, constipation, diarrhea and vomiting.  Endocrine: Negative.   Genitourinary: Negative for decreased urine volume, difficulty urinating, dysuria, frequency and urgency.  Musculoskeletal: Positive for gait problem. Negative for arthralgias, joint swelling, myalgias and neck pain.  Skin: Negative.  Negative for rash.  Allergic/Immunologic: Negative.   Neurological: Positive for dizziness, vertigo and light-headedness. Negative for tremors, seizures, syncope, facial asymmetry, speech difficulty, weakness, numbness and headaches.  Hematological: Negative.   Psychiatric/Behavioral: Negative for confusion, hallucinations, sleep disturbance and suicidal ideas.  All other systems reviewed and are negative.        Observations/Objective: No vital signs or physical exam, this was a telephone or virtual  health encounter.  Pt alert and oriented, answers all questions appropriately, and able to speak in full sentences.    Assessment and Plan: Lillyn was seen today for dizziness.  Diagnoses and all orders for this visit:  Dizziness History of brain shunt Pt with ongoing dizziness despite treatment with prednisone and meclizine. Pt has a VP shunt. No fever, chills, weakness, confusion, or focal deficits. No nuchal rigidity per pt report. Will obtain imaging and refer to neurology due to ongoing symptoms. Pt aware of symptoms that require emergent evaluation and treatment.  -     Ambulatory referral to Neurology -     CT Head Wo Contrast; Future     Follow Up Instructions: Return if symptoms worsen or fail to improve.    I discussed the assessment and treatment plan with the patient. The patient was provided an opportunity to ask questions and all were answered. The patient agreed with the plan and demonstrated an understanding of the instructions.   The patient was advised to call back or seek an in-person evaluation if the symptoms worsen or if the condition fails to improve as anticipated.  The above assessment and management plan was discussed with the patient. The patient verbalized understanding of and has agreed to the management plan. Patient is aware to call the clinic if they develop any new symptoms or if symptoms persist or worsen. Patient is aware when to return to the clinic for a follow-up visit. Patient educated on when it is appropriate to go to the emergency department.    I provided 25 minutes of non-face-to-face time during this encounter. The call started at 1010. The call ended at 1035. The other time was used for coordination of care.    Monia Pouch, FNP-C [facility] [address] [place], [place] [address] [phone] 10/18/2019

## 2019-10-21 ENCOUNTER — Telehealth: Payer: Self-pay | Admitting: Family Medicine

## 2019-10-21 DIAGNOSIS — N952 Postmenopausal atrophic vaginitis: Secondary | ICD-10-CM | POA: Diagnosis not present

## 2019-10-21 DIAGNOSIS — Z960 Presence of urogenital implants: Secondary | ICD-10-CM | POA: Diagnosis not present

## 2019-10-21 NOTE — Telephone Encounter (Signed)
Lmtcb - Spoke to referrals- States that it will be worked on asap.  Was not scheduled STAT and placed 2/19.

## 2019-10-21 NOTE — Telephone Encounter (Signed)
Pt called requesting to speak with referrals regarding CT scan. Referrals was in a meeting when pt called. Pt aware that a message would be sent to referrals and someone would call her back.

## 2019-10-22 ENCOUNTER — Telehealth: Payer: Self-pay | Admitting: Family Medicine

## 2019-10-22 NOTE — Addendum Note (Signed)
Addended by: Baruch Gouty on: 10/22/2019 07:23 PM   Modules accepted: Orders

## 2019-10-23 NOTE — Telephone Encounter (Signed)
Appointment 10/24/19.  Patient is aware per notes in referral scheduling.

## 2019-10-24 ENCOUNTER — Ambulatory Visit (HOSPITAL_COMMUNITY)
Admission: RE | Admit: 2019-10-24 | Discharge: 2019-10-24 | Disposition: A | Discharge status: Home / Self Care | Payer: Medicare Other | Source: Ambulatory Visit | Attending: Family Medicine | Admitting: Family Medicine

## 2019-10-24 ENCOUNTER — Other Ambulatory Visit: Payer: Self-pay

## 2019-10-24 DIAGNOSIS — R42 Dizziness and giddiness: Secondary | ICD-10-CM | POA: Diagnosis not present

## 2019-10-24 DIAGNOSIS — Z982 Presence of cerebrospinal fluid drainage device: Secondary | ICD-10-CM | POA: Insufficient documentation

## 2019-10-25 ENCOUNTER — Telehealth: Payer: Self-pay | Admitting: Family Medicine

## 2019-10-25 ENCOUNTER — Other Ambulatory Visit: Payer: Self-pay | Admitting: Family Medicine

## 2019-10-25 DIAGNOSIS — R42 Dizziness and giddiness: Secondary | ICD-10-CM

## 2019-10-25 MED ORDER — MECLIZINE HCL 12.5 MG PO TABS: 12.5000 mg | ORAL_TABLET | Freq: Two times a day (BID) | ORAL | 0 refills | Status: DC | PRN

## 2019-10-25 NOTE — Telephone Encounter (Signed)
Make sure she is staying hydrated and makes a follow up with her neurosurgeon. Will send in low dose meclizine to see if beneficial.

## 2019-10-25 NOTE — Telephone Encounter (Signed)
Patient aware and verbalized understanding. °

## 2019-10-25 NOTE — Telephone Encounter (Signed)
Aware of results.  She would like a script sent in for dizziness.  She is sure it has nothing to do with blood pressure cause she checks it regularly.  Last reading was 120's over 77.  She gets dizzy when she stands to start walking.

## 2019-10-28 DIAGNOSIS — M9901 Segmental and somatic dysfunction of cervical region: Secondary | ICD-10-CM | POA: Diagnosis not present

## 2019-10-28 DIAGNOSIS — M9900 Segmental and somatic dysfunction of head region: Secondary | ICD-10-CM | POA: Diagnosis not present

## 2019-10-28 DIAGNOSIS — M9902 Segmental and somatic dysfunction of thoracic region: Secondary | ICD-10-CM | POA: Diagnosis not present

## 2019-10-28 DIAGNOSIS — M62830 Muscle spasm of back: Secondary | ICD-10-CM | POA: Diagnosis not present

## 2019-10-29 ENCOUNTER — Ambulatory Visit: Payer: Medicare Other | Admitting: Neurology

## 2019-10-30 DIAGNOSIS — M9901 Segmental and somatic dysfunction of cervical region: Secondary | ICD-10-CM | POA: Diagnosis not present

## 2019-10-30 DIAGNOSIS — M9900 Segmental and somatic dysfunction of head region: Secondary | ICD-10-CM | POA: Diagnosis not present

## 2019-10-30 DIAGNOSIS — M62830 Muscle spasm of back: Secondary | ICD-10-CM | POA: Diagnosis not present

## 2019-10-30 DIAGNOSIS — M9902 Segmental and somatic dysfunction of thoracic region: Secondary | ICD-10-CM | POA: Diagnosis not present

## 2019-10-31 DIAGNOSIS — M9902 Segmental and somatic dysfunction of thoracic region: Secondary | ICD-10-CM | POA: Diagnosis not present

## 2019-10-31 DIAGNOSIS — M62830 Muscle spasm of back: Secondary | ICD-10-CM | POA: Diagnosis not present

## 2019-10-31 DIAGNOSIS — M9900 Segmental and somatic dysfunction of head region: Secondary | ICD-10-CM | POA: Diagnosis not present

## 2019-10-31 DIAGNOSIS — M9901 Segmental and somatic dysfunction of cervical region: Secondary | ICD-10-CM | POA: Diagnosis not present

## 2019-11-04 DIAGNOSIS — M9901 Segmental and somatic dysfunction of cervical region: Secondary | ICD-10-CM | POA: Diagnosis not present

## 2019-11-04 DIAGNOSIS — M9900 Segmental and somatic dysfunction of head region: Secondary | ICD-10-CM | POA: Diagnosis not present

## 2019-11-04 DIAGNOSIS — M9902 Segmental and somatic dysfunction of thoracic region: Secondary | ICD-10-CM | POA: Diagnosis not present

## 2019-11-04 DIAGNOSIS — M62830 Muscle spasm of back: Secondary | ICD-10-CM | POA: Diagnosis not present

## 2019-11-06 DIAGNOSIS — M9900 Segmental and somatic dysfunction of head region: Secondary | ICD-10-CM | POA: Diagnosis not present

## 2019-11-06 DIAGNOSIS — M9902 Segmental and somatic dysfunction of thoracic region: Secondary | ICD-10-CM | POA: Diagnosis not present

## 2019-11-06 DIAGNOSIS — M62830 Muscle spasm of back: Secondary | ICD-10-CM | POA: Diagnosis not present

## 2019-11-06 DIAGNOSIS — M9901 Segmental and somatic dysfunction of cervical region: Secondary | ICD-10-CM | POA: Diagnosis not present

## 2019-11-07 DIAGNOSIS — M62830 Muscle spasm of back: Secondary | ICD-10-CM | POA: Diagnosis not present

## 2019-11-07 DIAGNOSIS — M9900 Segmental and somatic dysfunction of head region: Secondary | ICD-10-CM | POA: Diagnosis not present

## 2019-11-07 DIAGNOSIS — M9902 Segmental and somatic dysfunction of thoracic region: Secondary | ICD-10-CM | POA: Diagnosis not present

## 2019-11-07 DIAGNOSIS — M9901 Segmental and somatic dysfunction of cervical region: Secondary | ICD-10-CM | POA: Diagnosis not present

## 2019-11-11 ENCOUNTER — Ambulatory Visit (INDEPENDENT_AMBULATORY_CARE_PROVIDER_SITE_OTHER): Payer: Medicare Other | Admitting: *Deleted

## 2019-11-11 DIAGNOSIS — I10 Essential (primary) hypertension: Secondary | ICD-10-CM | POA: Diagnosis not present

## 2019-11-11 DIAGNOSIS — N183 Chronic kidney disease, stage 3 unspecified: Secondary | ICD-10-CM | POA: Diagnosis not present

## 2019-11-11 DIAGNOSIS — E785 Hyperlipidemia, unspecified: Secondary | ICD-10-CM

## 2019-11-11 DIAGNOSIS — M9902 Segmental and somatic dysfunction of thoracic region: Secondary | ICD-10-CM | POA: Diagnosis not present

## 2019-11-11 DIAGNOSIS — M62830 Muscle spasm of back: Secondary | ICD-10-CM | POA: Diagnosis not present

## 2019-11-11 DIAGNOSIS — M9900 Segmental and somatic dysfunction of head region: Secondary | ICD-10-CM | POA: Diagnosis not present

## 2019-11-11 DIAGNOSIS — M9901 Segmental and somatic dysfunction of cervical region: Secondary | ICD-10-CM | POA: Diagnosis not present

## 2019-11-11 NOTE — Chronic Care Management (AMB) (Signed)
Chronic Care Management   Initial Visit Note  11/11/2019 Name: Erin Khan MRN: [id number] DOB: Jun 20, 1941  Referred by: Baruch Gouty, FNP Reason for referral : Chronic Care Management (RN Initial visit)   Erin Khan is a 79 y.o. year old female who is a primary care patient of Baruch Gouty, FNP. The CCM team was consulted for assistance with chronic disease management and care coordination needs related to HTN, osteoporosis, CKD, hyperlipidemia.  Review of patient status, including review of consultants reports, relevant laboratory and other test results, and collaboration with appropriate care team members and the patient's provider was performed as part of comprehensive patient evaluation and provision of chronic care management services.    Subjective: I spoke with Erin Khan by telephone today regarding her chronic medical conditions. She lives at home with her husband and does not have any problems with access to food, medication, housing, or transportation. She is physically active in and around her home on a daily basis. She does not have any concerns regarding her chronic medical conditions today.   SDOH (Social Determinants of Health) assessments performed: Yes See Care Plan activities for detailed interventions related to SDOH)    Objective: Outpatient Encounter Medications as of 11/11/2019  Medication Sig  . Calcium Carbonate-Vitamin D (CALCIUM 600+D PO) Take 1 tablet by mouth daily.  . cetirizine (ZYRTEC) 10 MG tablet Take by mouth.  . Ergocalciferol (VITAMIN D2) 10 MCG (400 UNIT) TABS Take 400 Units by mouth daily.  . Estradiol 0.25 0000000 GEL Place 1 Applicatorful vaginally 3 (three) times a week.  . ezetimibe (ZETIA) 10 MG tablet TAKE 1 TABLET BY MOUTH  DAILY  . lisinopril-hydrochlorothiazide (ZESTORETIC) 20-25 MG tablet TAKE 1 TABLET BY MOUTH  DAILY  . meclizine (ANTIVERT) 12.5 MG tablet Take 1 tablet (12.5 mg total) by mouth 2 (two) times daily as needed for  dizziness.  . Oxyquinoline-Sod Lauryl Sulf (TRIMO-SAN) 0.025-0.01 % GEL 1 g.  . pneumococcal 13-valent conjugate vaccine (PREVNAR 13) SUSP injection Prevnar 13 (PF) 0.5 mL intramuscular syringe  . pneumococcal 23 valent vaccine (PNEUMOVAX 23) 25 MCG/0.5ML injection Pneumovax-23 25 mcg/0.5 mL injection syringe  . vitamin B-12 (CYANOCOBALAMIN) 500 MCG tablet Take 500 mcg by mouth daily.    No facility-administered encounter medications on file as of 11/11/2019.    BMI Readings from Last 3 Encounters:  10/18/18 24.21 kg/m  05/30/18 25.06 kg/m  05/24/18 25.35 kg/m   Lab Results  Component Value Date   CHOL 234 (H) 05/14/2019   HDL 32 (L) 05/14/2019   LDLCALC 120 (H) 05/14/2019   LDLDIRECT 125 (H) 10/09/2017   TRIG 460 (H) 05/14/2019   CHOLHDL 7.3 (H) 05/14/2019   BP Readings from Last 3 Encounters:  10/18/18 138/72  05/30/18 (!) 160/76  05/24/18 (!) 155/82   Lab Results  Component Value Date   CREATININE 1.54 (H) 05/14/2019   BUN 32 (H) 05/14/2019   NA 141 05/14/2019   K 4.2 05/14/2019   CL 101 05/14/2019   CO2 24 05/14/2019    RN Assessment & Care Plan   . Chronic Disease Management Needs       CARE PLAN ENTRY (see longtitudinal plan of care for additional care plan information)  Current Barriers:  . Chronic Disease Management support, education, and care coordination needs related to HTN, osteoporosis, CKD, hyperlipidemia  Clinical Goal(s) related to HTN, osteoporosis, CKD, hyperlipidemia:  Over the next 60 days, patient will:  . Work with the care management team to address  educational, disease management, and care coordination needs  . Call provider office for new or worsened signs and symptoms New or worsened symptom related to chronic medical conditions . Call care management team with questions or concerns . Verbalize basic understanding of patient centered plan of care established today  Interventions related to HTN, osteoporosis, CKD, hyperlipidemia:   . Evaluation of current treatment plans and patient's adherence to plan as established by provider . Assessed patient understanding of disease states . Assessed patient's education and care coordination needs . Provided disease specific education to patient  . Collaborated with appropriate clinical care team members regarding patient needs  Patient Self Care Activities related to HTN, osteoporosis, CKD, hyperlipidemia:  . Patient can independently perform ADLs and IADLs  Initial goal documentation         Follow-up Plan:   The care management team will reach out to the patient again over the next 60 days.   Chong Sicilian, BSN, RN-BC Embedded Chronic Care Manager [facility] / [place] Management Direct Dial: [phone]

## 2019-11-11 NOTE — Patient Instructions (Signed)
Visit Information  Goals Addressed            This Visit's Progress   . Chronic Disease Management Needs       CARE PLAN ENTRY (see longtitudinal plan of care for additional care plan information)  Current Barriers:  . Chronic Disease Management support, education, and care coordination needs related to HTN, osteoporosis, CKD, hyperlipidemia  Clinical Goal(s) related to HTN, osteoporosis, CKD, hyperlipidemia:  Over the next 60 days, patient will:  . Work with the care management team to address educational, disease management, and care coordination needs  . Call provider office for new or worsened signs and symptoms New or worsened symptom related to chronic medical conditions . Call care management team with questions or concerns . Verbalize basic understanding of patient centered plan of care established today  Interventions related to HTN, osteoporosis, CKD, hyperlipidemia:  . Evaluation of current treatment plans and patient's adherence to plan as established by provider . Assessed patient understanding of disease states . Assessed patient's education and care coordination needs . Provided disease specific education to patient  . Collaborated with appropriate clinical care team members regarding patient needs  Patient Self Care Activities related to HTN, osteoporosis, CKD, hyperlipidemia:  . Patient can independently perform ADLs and IADLs  Initial goal documentation        Ms. Angelini was given information about Chronic Care Management services today including:  1. CCM service includes personalized support from designated clinical staff supervised by her physician, including individualized plan of care and coordination with other care providers 2. 24/7 contact phone numbers for assistance for urgent and routine care needs. 3. Service will only be billed when office clinical staff spend 20 minutes or more in a month to coordinate care. 4. Only one practitioner may furnish  and bill the service in a calendar month. 5. The patient may stop CCM services at any time (effective at the end of the month) by phone call to the office staff. 6. The patient will be responsible for cost sharing (co-pay) of up to 20% of the service fee (after annual deductible is met).  Patient agreed to services and verbal consent obtained.   The patient verbalized understanding of instructions provided today and declined a print copy of patient instruction materials.   The care management team will reach out to the patient again over the next 60 days.   Chong Sicilian, BSN, RN-BC Embedded Chronic Care Manager [facility] / [place] Management Direct Dial: [phone]

## 2019-11-13 DIAGNOSIS — M9902 Segmental and somatic dysfunction of thoracic region: Secondary | ICD-10-CM | POA: Diagnosis not present

## 2019-11-13 DIAGNOSIS — M9901 Segmental and somatic dysfunction of cervical region: Secondary | ICD-10-CM | POA: Diagnosis not present

## 2019-11-13 DIAGNOSIS — M9900 Segmental and somatic dysfunction of head region: Secondary | ICD-10-CM | POA: Diagnosis not present

## 2019-11-13 DIAGNOSIS — M62830 Muscle spasm of back: Secondary | ICD-10-CM | POA: Diagnosis not present

## 2019-11-18 DIAGNOSIS — M9902 Segmental and somatic dysfunction of thoracic region: Secondary | ICD-10-CM | POA: Diagnosis not present

## 2019-11-18 DIAGNOSIS — M62830 Muscle spasm of back: Secondary | ICD-10-CM | POA: Diagnosis not present

## 2019-11-18 DIAGNOSIS — M9901 Segmental and somatic dysfunction of cervical region: Secondary | ICD-10-CM | POA: Diagnosis not present

## 2019-11-18 DIAGNOSIS — M9900 Segmental and somatic dysfunction of head region: Secondary | ICD-10-CM | POA: Diagnosis not present

## 2019-11-19 DIAGNOSIS — M9900 Segmental and somatic dysfunction of head region: Secondary | ICD-10-CM | POA: Diagnosis not present

## 2019-11-19 DIAGNOSIS — M9902 Segmental and somatic dysfunction of thoracic region: Secondary | ICD-10-CM | POA: Diagnosis not present

## 2019-11-19 DIAGNOSIS — M62830 Muscle spasm of back: Secondary | ICD-10-CM | POA: Diagnosis not present

## 2019-11-19 DIAGNOSIS — M9901 Segmental and somatic dysfunction of cervical region: Secondary | ICD-10-CM | POA: Diagnosis not present

## 2019-11-25 DIAGNOSIS — M9901 Segmental and somatic dysfunction of cervical region: Secondary | ICD-10-CM | POA: Diagnosis not present

## 2019-11-25 DIAGNOSIS — M62830 Muscle spasm of back: Secondary | ICD-10-CM | POA: Diagnosis not present

## 2019-11-25 DIAGNOSIS — M9902 Segmental and somatic dysfunction of thoracic region: Secondary | ICD-10-CM | POA: Diagnosis not present

## 2019-11-25 DIAGNOSIS — M9900 Segmental and somatic dysfunction of head region: Secondary | ICD-10-CM | POA: Diagnosis not present

## 2019-11-27 DIAGNOSIS — M9901 Segmental and somatic dysfunction of cervical region: Secondary | ICD-10-CM | POA: Diagnosis not present

## 2019-11-27 DIAGNOSIS — M62830 Muscle spasm of back: Secondary | ICD-10-CM | POA: Diagnosis not present

## 2019-11-27 DIAGNOSIS — M9900 Segmental and somatic dysfunction of head region: Secondary | ICD-10-CM | POA: Diagnosis not present

## 2019-11-27 DIAGNOSIS — M9902 Segmental and somatic dysfunction of thoracic region: Secondary | ICD-10-CM | POA: Diagnosis not present

## 2019-12-02 DIAGNOSIS — M9902 Segmental and somatic dysfunction of thoracic region: Secondary | ICD-10-CM | POA: Diagnosis not present

## 2019-12-02 DIAGNOSIS — M62830 Muscle spasm of back: Secondary | ICD-10-CM | POA: Diagnosis not present

## 2019-12-02 DIAGNOSIS — M9900 Segmental and somatic dysfunction of head region: Secondary | ICD-10-CM | POA: Diagnosis not present

## 2019-12-02 DIAGNOSIS — M9901 Segmental and somatic dysfunction of cervical region: Secondary | ICD-10-CM | POA: Diagnosis not present

## 2019-12-04 DIAGNOSIS — M9902 Segmental and somatic dysfunction of thoracic region: Secondary | ICD-10-CM | POA: Diagnosis not present

## 2019-12-04 DIAGNOSIS — M9900 Segmental and somatic dysfunction of head region: Secondary | ICD-10-CM | POA: Diagnosis not present

## 2019-12-04 DIAGNOSIS — M62830 Muscle spasm of back: Secondary | ICD-10-CM | POA: Diagnosis not present

## 2019-12-04 DIAGNOSIS — M9901 Segmental and somatic dysfunction of cervical region: Secondary | ICD-10-CM | POA: Diagnosis not present

## 2019-12-11 DIAGNOSIS — M9900 Segmental and somatic dysfunction of head region: Secondary | ICD-10-CM | POA: Diagnosis not present

## 2019-12-11 DIAGNOSIS — M62830 Muscle spasm of back: Secondary | ICD-10-CM | POA: Diagnosis not present

## 2019-12-11 DIAGNOSIS — M9902 Segmental and somatic dysfunction of thoracic region: Secondary | ICD-10-CM | POA: Diagnosis not present

## 2019-12-11 DIAGNOSIS — M9901 Segmental and somatic dysfunction of cervical region: Secondary | ICD-10-CM | POA: Diagnosis not present

## 2019-12-18 DIAGNOSIS — M62830 Muscle spasm of back: Secondary | ICD-10-CM | POA: Diagnosis not present

## 2019-12-18 DIAGNOSIS — M9901 Segmental and somatic dysfunction of cervical region: Secondary | ICD-10-CM | POA: Diagnosis not present

## 2019-12-18 DIAGNOSIS — M9902 Segmental and somatic dysfunction of thoracic region: Secondary | ICD-10-CM | POA: Diagnosis not present

## 2019-12-18 DIAGNOSIS — M9900 Segmental and somatic dysfunction of head region: Secondary | ICD-10-CM | POA: Diagnosis not present

## 2019-12-30 DIAGNOSIS — M9900 Segmental and somatic dysfunction of head region: Secondary | ICD-10-CM | POA: Diagnosis not present

## 2019-12-30 DIAGNOSIS — M62830 Muscle spasm of back: Secondary | ICD-10-CM | POA: Diagnosis not present

## 2019-12-30 DIAGNOSIS — M9901 Segmental and somatic dysfunction of cervical region: Secondary | ICD-10-CM | POA: Diagnosis not present

## 2019-12-30 DIAGNOSIS — M9902 Segmental and somatic dysfunction of thoracic region: Secondary | ICD-10-CM | POA: Diagnosis not present

## 2020-01-01 DIAGNOSIS — M9901 Segmental and somatic dysfunction of cervical region: Secondary | ICD-10-CM | POA: Diagnosis not present

## 2020-01-01 DIAGNOSIS — M9900 Segmental and somatic dysfunction of head region: Secondary | ICD-10-CM | POA: Diagnosis not present

## 2020-01-01 DIAGNOSIS — M9902 Segmental and somatic dysfunction of thoracic region: Secondary | ICD-10-CM | POA: Diagnosis not present

## 2020-01-01 DIAGNOSIS — M62830 Muscle spasm of back: Secondary | ICD-10-CM | POA: Diagnosis not present

## 2020-01-06 DIAGNOSIS — M62830 Muscle spasm of back: Secondary | ICD-10-CM | POA: Diagnosis not present

## 2020-01-06 DIAGNOSIS — M9900 Segmental and somatic dysfunction of head region: Secondary | ICD-10-CM | POA: Diagnosis not present

## 2020-01-06 DIAGNOSIS — M9901 Segmental and somatic dysfunction of cervical region: Secondary | ICD-10-CM | POA: Diagnosis not present

## 2020-01-06 DIAGNOSIS — M9902 Segmental and somatic dysfunction of thoracic region: Secondary | ICD-10-CM | POA: Diagnosis not present

## 2020-01-14 DIAGNOSIS — M62830 Muscle spasm of back: Secondary | ICD-10-CM | POA: Diagnosis not present

## 2020-01-14 DIAGNOSIS — M9902 Segmental and somatic dysfunction of thoracic region: Secondary | ICD-10-CM | POA: Diagnosis not present

## 2020-01-14 DIAGNOSIS — M9901 Segmental and somatic dysfunction of cervical region: Secondary | ICD-10-CM | POA: Diagnosis not present

## 2020-01-14 DIAGNOSIS — M9900 Segmental and somatic dysfunction of head region: Secondary | ICD-10-CM | POA: Diagnosis not present

## 2020-01-21 DIAGNOSIS — M9902 Segmental and somatic dysfunction of thoracic region: Secondary | ICD-10-CM | POA: Diagnosis not present

## 2020-01-21 DIAGNOSIS — M9901 Segmental and somatic dysfunction of cervical region: Secondary | ICD-10-CM | POA: Diagnosis not present

## 2020-01-21 DIAGNOSIS — M62830 Muscle spasm of back: Secondary | ICD-10-CM | POA: Diagnosis not present

## 2020-01-21 DIAGNOSIS — M9900 Segmental and somatic dysfunction of head region: Secondary | ICD-10-CM | POA: Diagnosis not present

## 2020-02-03 ENCOUNTER — Ambulatory Visit (INDEPENDENT_AMBULATORY_CARE_PROVIDER_SITE_OTHER): Payer: Medicare Other | Admitting: *Deleted

## 2020-02-03 DIAGNOSIS — E785 Hyperlipidemia, unspecified: Secondary | ICD-10-CM | POA: Diagnosis not present

## 2020-02-03 DIAGNOSIS — I10 Essential (primary) hypertension: Secondary | ICD-10-CM

## 2020-02-03 DIAGNOSIS — H6982 Other specified disorders of Eustachian tube, left ear: Secondary | ICD-10-CM | POA: Diagnosis not present

## 2020-02-19 DIAGNOSIS — Z960 Presence of urogenital implants: Secondary | ICD-10-CM | POA: Diagnosis not present

## 2020-02-19 DIAGNOSIS — N952 Postmenopausal atrophic vaginitis: Secondary | ICD-10-CM | POA: Diagnosis not present

## 2020-02-19 DIAGNOSIS — N8189 Other female genital prolapse: Secondary | ICD-10-CM | POA: Diagnosis not present

## 2020-02-19 DIAGNOSIS — N819 Female genital prolapse, unspecified: Secondary | ICD-10-CM | POA: Diagnosis not present

## 2020-02-24 NOTE — Chronic Care Management (AMB) (Signed)
Chronic Care Management   Follow Up Note   02/03/2020 Name: AHLANI WICKES MRN: [id number] DOB: [birth year]  Referred by: No primary care provider on file. Reason for referral : Chronic Care Management (RN follow up)   Erin Khan is a 79 y.o. year old female who is a primary care patient of No primary care provider on file.. The CCM team was consulted for assistance with chronic disease management and care coordination needs.    Review of patient status, including review of consultants reports, relevant laboratory and other test results, and collaboration with appropriate care team members and the patient's provider was performed as part of comprehensive patient evaluation and provision of chronic care management services.    SDOH (Social Determinants of Health) assessments performed: No See Care Plan activities for detailed interventions related to [facility])     Outpatient Encounter Medications as of 02/03/2020  Medication Sig  . Calcium Carbonate-Vitamin D (CALCIUM 600+D PO) Take 1 tablet by mouth daily.  . cetirizine (ZYRTEC) 10 MG tablet Take by mouth.  . Ergocalciferol (VITAMIN D2) 10 MCG (400 UNIT) TABS Take 400 Units by mouth daily.  . Estradiol 0.25 MH/9.62IW GEL Place 1 Applicatorful vaginally 3 (three) times a week.  . ezetimibe (ZETIA) 10 MG tablet TAKE 1 TABLET BY MOUTH  DAILY  . lisinopril-hydrochlorothiazide (ZESTORETIC) 20-25 MG tablet TAKE 1 TABLET BY MOUTH  DAILY  . meclizine (ANTIVERT) 12.5 MG tablet Take 1 tablet (12.5 mg total) by mouth 2 (two) times daily as needed for dizziness.  . Oxyquinoline-Sod Lauryl Sulf (TRIMO-SAN) 0.025-0.01 % GEL 1 g.  . pneumococcal 13-valent conjugate vaccine (PREVNAR 13) SUSP injection Prevnar 13 (PF) 0.5 mL intramuscular syringe  . pneumococcal 23 valent vaccine (PNEUMOVAX 23) 25 MCG/0.5ML injection Pneumovax-23 25 mcg/0.5 mL injection syringe  . vitamin B-12 (CYANOCOBALAMIN) 500 MCG tablet Take 500 mcg by mouth daily.    No  facility-administered encounter medications on file as of 02/03/2020.     Objective:   Goals Addressed            This Visit's Progress   . Chronic Disease Management Needs   On track    CARE PLAN ENTRY (see longtitudinal plan of care for additional care plan information)  Current Barriers:  . Chronic Disease Management support, education, and care coordination needs related to HTN, osteoporosis, CKD, hyperlipidemia  Clinical Goal(s) related to HTN, osteoporosis, CKD, hyperlipidemia:  Over the next 90 days, patient will:  . Work with the care management team to address educational, disease management, and care coordination needs  . Call provider office for new or worsened signs and symptoms New or worsened symptom related to chronic medical conditions . Call care management team with questions or concerns . Schedule routine f/u with PCP  Interventions related to HTN, osteoporosis, CKD, hyperlipidemia:  . Evaluation of current treatment plans and patient's adherence to plan as established by provider . Assessed patient's education and care coordination needs . Collaborated with appropriate clinical care team members regarding patient needs . Chart reviewed including recent office notes and lab results . Mediations reviewed . Advised due for routine f/u with PCP  Patient Self Care Activities related to HTN, osteoporosis, CKD, hyperlipidemia:  . Patient can independently perform ADLs and IADLs  Please see past updates related to this goal by clicking on the "Past Updates" button in the selected goal          Plan:   The care management team will reach out to the patient  again over the next 90 days.  The patient has been provided with contact information for the care management team and has been advised to call with any health related questions or concerns.    Chong Sicilian, BSN, RN-BC Embedded Chronic Care Manager [facility] / [place]  Management Direct Dial: [phone]

## 2020-02-24 NOTE — Patient Instructions (Signed)
Visit Information  Goals Addressed            This Visit's Progress   . Chronic Disease Management Needs   On track    CARE PLAN ENTRY (see longtitudinal plan of care for additional care plan information)  Current Barriers:  . Chronic Disease Management support, education, and care coordination needs related to HTN, osteoporosis, CKD, hyperlipidemia  Clinical Goal(s) related to HTN, osteoporosis, CKD, hyperlipidemia:  Over the next 90 days, patient will:  . Work with the care management team to address educational, disease management, and care coordination needs  . Call provider office for new or worsened signs and symptoms New or worsened symptom related to chronic medical conditions . Call care management team with questions or concerns . Schedule routine f/u with PCP  Interventions related to HTN, osteoporosis, CKD, hyperlipidemia:  . Evaluation of current treatment plans and patient's adherence to plan as established by provider . Assessed patient's education and care coordination needs . Collaborated with appropriate clinical care team members regarding patient needs . Chart reviewed including recent office notes and lab results . Mediations reviewed . Advised due for routine f/u with PCP  Patient Self Care Activities related to HTN, osteoporosis, CKD, hyperlipidemia:  . Patient can independently perform ADLs and IADLs  Please see past updates related to this goal by clicking on the "Past Updates" button in the selected goal         Patient verbalizes understanding of instructions provided today.   Follow-up Plan The care management team will reach out to the patient again over the next 90 days.  The patient has been provided with contact information for the care management team and has been advised to call with any health related questions or concerns.   Chong Sicilian, BSN, RN-BC Embedded Chronic Care Manager [facility] / [place]  Management Direct Dial: [phone]

## 2020-03-10 ENCOUNTER — Other Ambulatory Visit: Payer: Self-pay | Admitting: *Deleted

## 2020-03-10 DIAGNOSIS — I10 Essential (primary) hypertension: Secondary | ICD-10-CM

## 2020-03-10 NOTE — Telephone Encounter (Signed)
[facility] [place] 05/13/19. Mail order not sent

## 2020-03-26 ENCOUNTER — Telehealth: Payer: Self-pay | Admitting: *Deleted

## 2020-03-26 NOTE — Telephone Encounter (Signed)
Fax came in for Lininopril/hctz today from mail order - [facility]   Pt seen [facility] 09/2019 Needs appt and labs with new PCP  Not sent to mail = may send IF appt made  Needs to update PCP

## 2020-03-26 NOTE — Telephone Encounter (Signed)
Pt scheduled with [facility] 04/10/20 at 11:40. Pt states she has enough medication to get her through to appt.

## 2020-03-31 ENCOUNTER — Telehealth: Payer: Self-pay | Admitting: *Deleted

## 2020-03-31 ENCOUNTER — Telehealth: Payer: Medicare Other | Admitting: *Deleted

## 2020-03-31 NOTE — Telephone Encounter (Signed)
  Chronic Care Management   Outreach Note  03/31/2020 Name: Erin Khan MRN: [id number] DOB: 1941-07-09  Referred by: Sharion Balloon, FNP Reason for referral : Chronic Care Management (RN follow up)   An unsuccessful telephone follow-up was attempted today. The patient was referred to the case management team for assistance with care management and care coordination.   Follow Up Plan: A HIPPA compliant phone message was left for the patient providing contact information and requesting a return call.  The care management team will reach out to the patient again over the next 45 days.   Chong Sicilian, BSN, RN-BC Embedded Chronic Care Manager [facility] / [place] Management Direct Dial: [phone]

## 2020-04-10 ENCOUNTER — Other Ambulatory Visit: Payer: Self-pay

## 2020-04-10 ENCOUNTER — Encounter: Payer: Self-pay | Admitting: Family

## 2020-04-10 ENCOUNTER — Ambulatory Visit (INDEPENDENT_AMBULATORY_CARE_PROVIDER_SITE_OTHER): Payer: Medicare Other | Admitting: Family

## 2020-04-10 VITALS — BP 137/82 | HR 85 | Temp 97.8°F | Ht 66.0 in | Wt 154.0 lb

## 2020-04-10 DIAGNOSIS — R5383 Other fatigue: Secondary | ICD-10-CM

## 2020-04-10 DIAGNOSIS — I10 Essential (primary) hypertension: Secondary | ICD-10-CM

## 2020-04-10 DIAGNOSIS — E782 Mixed hyperlipidemia: Secondary | ICD-10-CM | POA: Diagnosis not present

## 2020-04-10 DIAGNOSIS — Z1159 Encounter for screening for other viral diseases: Secondary | ICD-10-CM

## 2020-04-10 DIAGNOSIS — M810 Age-related osteoporosis without current pathological fracture: Secondary | ICD-10-CM

## 2020-04-10 DIAGNOSIS — N183 Chronic kidney disease, stage 3 unspecified: Secondary | ICD-10-CM

## 2020-04-10 MED ORDER — LISINOPRIL-HYDROCHLOROTHIAZIDE 20-25 MG PO TABS: 1.0000 | ORAL_TABLET | Freq: Every day | ORAL | 3 refills | Status: DC

## 2020-04-10 MED ORDER — EZETIMIBE 10 MG PO TABS: 10.0000 mg | ORAL_TABLET | Freq: Every day | ORAL | 3 refills | Status: DC

## 2020-04-10 NOTE — Patient Instructions (Signed)
Osteoporosis  Osteoporosis is thinning and loss of density in your bones. Osteoporosis makes bones more brittle and fragile and more likely to break (fracture). Over time, osteoporosis can cause your bones to become so weak that they fracture after a minor fall. Bones in the hip, wrist, and spine are most likely to fracture due to osteoporosis. What are the causes? The exact cause of this condition is not known. What increases the risk? You may be at greater risk for osteoporosis if you:  Have a family history of the condition.  Have poor nutrition.  Use steroid medicines, such as prednisone.  Are female.  Are age 78 or older.  Smoke or have a history of smoking.  Are not physically active (are sedentary).  Are white (Caucasian) or of Asian descent.  Have a small body frame.  Take certain medicines, such as antiseizure medicines. What are the signs or symptoms? A fracture might be the first sign of osteoporosis, especially if the fracture results from a fall or injury that usually would not cause a bone to break. Other signs and symptoms include:  Pain in the neck or low back.  Stooped posture.  Loss of height. How is this diagnosed? This condition may be diagnosed based on:  Your medical history.  A physical exam.  A bone mineral density test, also called a DXA or DEXA test (dual-energy X-ray absorptiometry test). This test uses X-rays to measure the amount of minerals in your bones. How is this treated? The goal of treatment is to strengthen your bones and lower your risk for a fracture. Treatment may involve:  Making lifestyle changes, such as: ? Including foods with more calcium and vitamin D in your diet. ? Doing weight-bearing and muscle-strengthening exercises. ? Stopping tobacco use. ? Limiting alcohol intake.  Taking medicine to slow the process of bone loss or to increase bone density.  Taking daily supplements of calcium and vitamin D.  Taking  hormone replacement medicines, such as estrogen for women and testosterone for men.  Monitoring your levels of calcium and vitamin D. Follow these instructions at home:  Activity  Exercise as told by your health care provider. Ask your health care provider what exercises and activities are safe for you. You should do: ? Exercises that make you work against gravity (weight-bearing exercises), such as tai chi, yoga, or walking. ? Exercises to strengthen muscles, such as lifting weights. Lifestyle  Limit alcohol intake to no more than 1 drink a day for nonpregnant women and 2 drinks a day for men. One drink equals 12 oz of beer, 5 oz of wine, or 1 oz of hard liquor.  Do not use any products that contain nicotine or tobacco, such as cigarettes and e-cigarettes. If you need help quitting, ask your health care provider. Preventing falls  Use devices to help you move around (mobility aids) as needed, such as canes, walkers, scooters, or crutches.  Keep rooms well-lit and clutter-free.  Remove tripping hazards from walkways, including cords and throw rugs.  Install grab bars in bathrooms and safety rails on stairs.  Use rubber mats in the bathroom and other areas that are often wet or slippery.  Wear closed-toe shoes that fit well and support your feet. Wear shoes that have rubber soles or low heels.  Review your medicines with your health care provider. Some medicines can cause dizziness or changes in blood pressure, which can increase your risk of falling. General instructions  Include calcium and vitamin D in  your diet. Calcium is important for bone health, and vitamin D helps your body to absorb calcium. Good sources of calcium and vitamin D include: ? Certain fatty fish, such as salmon and tuna. ? Products that have calcium and vitamin D added to them (fortified products), such as fortified cereals. ? Egg yolks. ? Cheese. ? Liver.  Take over-the-counter and prescription medicines  only as told by your health care provider.  Keep all follow-up visits as told by your health care provider. This is important. Contact a health care provider if:  You have never been screened for osteoporosis and you are: ? A woman who is age 78 or older. ? A man who is age 20 or older. Get help right away if:  You fall or injure yourself. Summary  Osteoporosis is thinning and loss of density in your bones. This makes bones more brittle and fragile and more likely to break (fracture),even with minor falls.  The goal of treatment is to strengthen your bones and reduce your risk for a fracture.  Include calcium and vitamin D in your diet. Calcium is important for bone health, and vitamin D helps your body to absorb calcium.  Talk with your health care provider about screening for osteoporosis if you are a woman who is age 78 or older, or a man who is age 15 or older. This information is not intended to replace advice given to you by your health care provider. Make sure you discuss any questions you have with your health care provider. Document Revised: 07/28/2017 Document Reviewed: 06/09/2017 Elsevier Patient Education  2020 [employer].

## 2020-04-10 NOTE — Progress Notes (Signed)
Subjective:    Patient ID: Erin Khan, female    DOB: 09-Jan-1941, 79 y.o.   MRN: [id number]  Chief Complaint  Patient presents with  . Medical Management of Chronic Issues  . Hypertension   PT presents to the office today to establish care with me. She is followed by GYN annually. She has an appt with Neurologists next week for changes in her gail and balance. 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 Had a CT scan on 10/24/19 that was stable.   She reports she is having fatigue that started a few month ago.  Hypertension This is a chronic problem. The current episode started more than 1 year ago. The problem has been waxing and waning since onset. The problem is uncontrolled. Associated symptoms include malaise/fatigue. Pertinent negatives include no peripheral edema or shortness of breath. Risk factors for coronary artery disease include dyslipidemia and sedentary lifestyle. The current treatment provides moderate improvement. Hypertensive end-organ damage includes kidney disease. There is no history of CVA or heart failure.  Hyperlipidemia This is a chronic problem. The current episode started more than 1 year ago. The problem is uncontrolled. Recent lipid tests were reviewed and are high. Pertinent negatives include no shortness of breath. Current antihyperlipidemic treatment includes ezetimibe. The current treatment provides mild improvement of lipids. Risk factors for coronary artery disease include dyslipidemia, a sedentary lifestyle, post-menopausal and hypertension.  Osteoporosis She takes calcium and vit D daily. She states she walks. Last Dexa scan was 12/08/16.    Review of Systems  Constitutional: Positive for malaise/fatigue.  Respiratory: Negative for shortness of breath.   All other systems reviewed and are negative.      Objective:   Physical Exam Vitals reviewed.  Constitutional:      General: She is not in acute distress.    Appearance: She is well-developed.  HENT:       Head: Normocephalic and atraumatic.     Right Ear: Tympanic membrane normal.     Left Ear: Tympanic membrane normal.  Eyes:     Pupils: Pupils are equal, round, and reactive to light.  Neck:     Thyroid: No thyromegaly.  Cardiovascular:     Rate and Rhythm: Normal rate and regular rhythm.     Heart sounds: Normal heart sounds. No murmur heard.   Pulmonary:     Effort: Pulmonary effort is normal. No respiratory distress.     Breath sounds: Normal breath sounds. No wheezing.  Abdominal:     General: Bowel sounds are normal. There is no distension.     Palpations: Abdomen is soft.     Tenderness: There is no abdominal tenderness.  Musculoskeletal:        General: No tenderness. Normal range of motion.     Cervical back: Normal range of motion and neck supple.  Skin:    General: Skin is warm and dry.  Neurological:     Mental Status: She is alert and oriented to person, place, and time.     Cranial Nerves: No cranial nerve deficit.     Deep Tendon Reflexes: Reflexes are normal and symmetric.  Psychiatric:        Behavior: Behavior normal.        Thought Content: Thought content normal.        Judgment: Judgment normal.       BP (!) 150/83   Pulse 87   Temp 97.8 F (36.6 C) (Temporal)   Ht '5\' 6"'  (1.676 m)  Wt 154 lb (69.9 kg)   SpO2 95%   BMI 24.86 kg/m      Assessment & Plan:  Erin Khan comes in today with chief complaint of Medical Management of Chronic Issues and Hypertension   Diagnosis and orders addressed:  1. Essential hypertension - lisinopril-hydrochlorothiazide (ZESTORETIC) 20-25 MG tablet; Take 1 tablet by mouth daily.  Dispense: 90 tablet; Refill: 3 - CMP14+EGFR - CBC with Differential/Platelet  2. Mixed hyperlipidemia - ezetimibe (ZETIA) 10 MG tablet; Take 1 tablet (10 mg total) by mouth daily.  Dispense: 90 tablet; Refill: 3 - CMP14+EGFR - CBC with Differential/Platelet  3. Age-related osteoporosis without current pathological  fracture - CMP14+EGFR - CBC with Differential/Platelet  4. Stage 3 chronic kidney disease, unspecified whether stage 3a or 3b CKD - CMP14+EGFR - CBC with Differential/Platelet  5. Need for hepatitis C screening test - CMP14+EGFR - CBC with Differential/Platelet - Hepatitis C antibody  6. Fatigue, unspecified type - TSH   Labs pending Health Maintenance reviewed Diet and exercise encouraged  Follow up plan: 6 months   Evelina Dun, FNP

## 2020-04-11 LAB — CBC WITH DIFFERENTIAL/PLATELET
Basophils Absolute: 0.1 10*3/uL (ref 0.0–0.2)
Basos: 1 %
EOS (ABSOLUTE): 0.1 10*3/uL (ref 0.0–0.4)
Eos: 1 %
Hematocrit: 38 % (ref 34.0–46.6)
Hemoglobin: 13 g/dL (ref 11.1–15.9)
Immature Grans (Abs): 0 10*3/uL (ref 0.0–0.1)
Immature Granulocytes: 0 %
Lymphocytes Absolute: 1.6 10*3/uL (ref 0.7–3.1)
Lymphs: 22 %
MCH: 31.3 pg (ref 26.6–33.0)
MCHC: 34.2 g/dL (ref 31.5–35.7)
MCV: 92 fL (ref 79–97)
Monocytes Absolute: 1.1 10*3/uL — ABNORMAL HIGH (ref 0.1–0.9)
Monocytes: 14 %
Neutrophils Absolute: 4.7 10*3/uL (ref 1.4–7.0)
Neutrophils: 62 %
Platelets: 208 10*3/uL (ref 150–450)
RBC: 4.15 x10E6/uL (ref 3.77–5.28)
RDW: 12.2 % (ref 11.7–15.4)
WBC: 7.5 10*3/uL (ref 3.4–10.8)

## 2020-04-11 LAB — CMP14+EGFR
ALT: 22 IU/L (ref 0–32)
AST: 13 IU/L (ref 0–40)
Albumin/Globulin Ratio: 1.6 (ref 1.2–2.2)
Albumin: 4.4 g/dL (ref 3.7–4.7)
Alkaline Phosphatase: 95 IU/L (ref 48–121)
BUN/Creatinine Ratio: 22 (ref 12–28)
BUN: 41 mg/dL — ABNORMAL HIGH (ref 8–27)
Bilirubin Total: 0.4 mg/dL (ref 0.0–1.2)
CO2: 21 mmol/L (ref 20–29)
Calcium: 9.2 mg/dL (ref 8.7–10.3)
Chloride: 102 mmol/L (ref 96–106)
Creatinine, Ser: 1.88 mg/dL — ABNORMAL HIGH (ref 0.57–1.00)
GFR calc Af Amer: 29 mL/min/{1.73_m2} — ABNORMAL LOW (ref >59)
GFR calc non Af Amer: 25 mL/min/{1.73_m2} — ABNORMAL LOW (ref >59)
Globulin, Total: 2.8 g/dL (ref 1.5–4.5)
Glucose: 95 mg/dL (ref 65–99)
Potassium: 3.9 mmol/L (ref 3.5–5.2)
Sodium: 137 mmol/L (ref 134–144)
Total Protein: 7.2 g/dL (ref 6.0–8.5)

## 2020-04-11 LAB — HEPATITIS C ANTIBODY: Hep C Virus Ab: <0.1 s/co ratio (ref 0.0–0.9)

## 2020-04-11 LAB — TSH: TSH: 0.357 u[IU]/mL — ABNORMAL LOW (ref 0.450–4.500)

## 2020-04-13 ENCOUNTER — Other Ambulatory Visit: Payer: Self-pay | Admitting: Family

## 2020-04-13 MED ORDER — LISINOPRIL 20 MG PO TABS
20.0000 mg | ORAL_TABLET | Freq: Every day | ORAL | 3 refills | Status: DC
Start: 2020-04-13 — End: 2020-05-19

## 2020-04-14 ENCOUNTER — Telehealth: Payer: Self-pay | Admitting: Family

## 2020-04-14 NOTE — Telephone Encounter (Signed)
Aware of lab results  

## 2020-04-23 DIAGNOSIS — R42 Dizziness and giddiness: Secondary | ICD-10-CM | POA: Diagnosis not present

## 2020-04-23 DIAGNOSIS — H9313 Tinnitus, bilateral: Secondary | ICD-10-CM | POA: Diagnosis not present

## 2020-04-23 DIAGNOSIS — H903 Sensorineural hearing loss, bilateral: Secondary | ICD-10-CM | POA: Diagnosis not present

## 2020-04-23 DIAGNOSIS — H8111 Benign paroxysmal vertigo, right ear: Secondary | ICD-10-CM | POA: Diagnosis not present

## 2020-05-05 DIAGNOSIS — R42 Dizziness and giddiness: Secondary | ICD-10-CM | POA: Diagnosis not present

## 2020-05-18 ENCOUNTER — Other Ambulatory Visit (HOSPITAL_COMMUNITY): Payer: Self-pay | Admitting: Obstetrics and Gynecology

## 2020-05-18 DIAGNOSIS — Z1231 Encounter for screening mammogram for malignant neoplasm of breast: Secondary | ICD-10-CM

## 2020-05-19 ENCOUNTER — Encounter: Payer: Self-pay | Admitting: Family

## 2020-05-19 ENCOUNTER — Other Ambulatory Visit: Payer: Self-pay

## 2020-05-19 ENCOUNTER — Ambulatory Visit (INDEPENDENT_AMBULATORY_CARE_PROVIDER_SITE_OTHER): Payer: Medicare Other | Admitting: Family

## 2020-05-19 VITALS — BP 170/88 | HR 78 | Temp 97.0°F | Ht 66.0 in | Wt 157.2 lb

## 2020-05-19 DIAGNOSIS — H832X1 Labyrinthine dysfunction, right ear: Secondary | ICD-10-CM | POA: Diagnosis not present

## 2020-05-19 DIAGNOSIS — H903 Sensorineural hearing loss, bilateral: Secondary | ICD-10-CM | POA: Diagnosis not present

## 2020-05-19 DIAGNOSIS — Z23 Encounter for immunization: Secondary | ICD-10-CM

## 2020-05-19 DIAGNOSIS — H8111 Benign paroxysmal vertigo, right ear: Secondary | ICD-10-CM | POA: Diagnosis not present

## 2020-05-19 DIAGNOSIS — I10 Essential (primary) hypertension: Secondary | ICD-10-CM | POA: Diagnosis not present

## 2020-05-19 DIAGNOSIS — R42 Dizziness and giddiness: Secondary | ICD-10-CM | POA: Diagnosis not present

## 2020-05-19 MED ORDER — LISINOPRIL 20 MG PO TABS: 20.0000 mg | ORAL_TABLET | Freq: Every day | ORAL | 3 refills | Status: DC

## 2020-05-19 NOTE — Patient Instructions (Signed)

## 2020-05-19 NOTE — Progress Notes (Signed)
   Subjective:    Patient ID: Erin Khan, female    DOB: April 29, 1941, 79 y.o.   MRN: [id number]  Chief Complaint  Patient presents with  . Blood Pressure Check    stopped hctz    Pt presents to the office today to recheck HTN. She was seen last month and her creatinine was elevated and we stopped her HCTZ. She misunderstood and stopped all of her BP medication. Her BP today is elevated.  Hypertension This is a chronic problem. The current episode started more than 1 year ago. The problem has been waxing and waning since onset. The problem is uncontrolled. Associated symptoms include malaise/fatigue. Pertinent negatives include no peripheral edema or shortness of breath. Risk factors for coronary artery disease include obesity and sedentary lifestyle. The current treatment provides moderate improvement. There is no history of CVA or heart failure.      Review of Systems  Constitutional: Positive for malaise/fatigue.  Respiratory: Negative for shortness of breath.   All other systems reviewed and are negative.      Objective:   Physical Exam Vitals reviewed.  Constitutional:      General: She is not in acute distress.    Appearance: She is well-developed.  HENT:     Head: Normocephalic and atraumatic.  Eyes:     Pupils: Pupils are equal, round, and reactive to light.  Neck:     Thyroid: No thyromegaly.  Cardiovascular:     Rate and Rhythm: Normal rate and regular rhythm.     Heart sounds: Normal heart sounds. No murmur heard.   Pulmonary:     Effort: Pulmonary effort is normal. No respiratory distress.     Breath sounds: Normal breath sounds. No wheezing.  Abdominal:     General: Bowel sounds are normal. There is no distension.     Palpations: Abdomen is soft.     Tenderness: There is no abdominal tenderness.  Musculoskeletal:        General: No tenderness. Normal range of motion.     Cervical back: Normal range of motion and neck supple.  Skin:    General: Skin is warm  and dry.  Neurological:     Mental Status: She is alert and oriented to person, place, and time.     Cranial Nerves: No cranial nerve deficit.     Deep Tendon Reflexes: Reflexes are normal and symmetric.  Psychiatric:        Behavior: Behavior normal.        Thought Content: Thought content normal.        Judgment: Judgment normal.          BP (!) 214/95   Pulse 78   Temp (!) 97 F (36.1 C) (Temporal)   Ht $R'5\' 6"'pP$  (1.676 m)   Wt 157 lb 3.2 oz (71.3 kg)   BMI 25.37 kg/m   Assessment & Plan:  MYLYNN DINH comes in today with chief complaint of Blood Pressure Check (stopped hctz )   Diagnosis and orders addressed:  1. Need for immunization against influenza - Flu Vaccine QUAD High Dose(Fluad)  2. Essential hypertension Will restart Lisinopril 20 mg  -Daily blood pressure log given with instructions on how to fill out and told to bring to next visit -Dash diet information given -Exercise encouraged - Stress Management  -Continue current meds -RTO in 2 weeks  - BMP8+EGFR     Evelina Dun, FNP

## 2020-05-20 ENCOUNTER — Telehealth: Payer: Self-pay | Admitting: Family

## 2020-05-20 LAB — BMP8+EGFR
BUN/Creatinine Ratio: 17 (ref 12–28)
BUN: 23 mg/dL (ref 8–27)
CO2: 26 mmol/L (ref 20–29)
Calcium: 9.4 mg/dL (ref 8.7–10.3)
Chloride: 106 mmol/L (ref 96–106)
Creatinine, Ser: 1.38 mg/dL — ABNORMAL HIGH (ref 0.57–1.00)
GFR calc Af Amer: 42 mL/min/{1.73_m2} — ABNORMAL LOW (ref >59)
GFR calc non Af Amer: 37 mL/min/{1.73_m2} — ABNORMAL LOW (ref >59)
Glucose: 92 mg/dL (ref 65–99)
Potassium: 4.2 mmol/L (ref 3.5–5.2)
Sodium: 142 mmol/L (ref 134–144)

## 2020-05-20 NOTE — Telephone Encounter (Signed)
Pt says that pharmacist at [facility] questioning dosage for lisinopril (ZESTRIL) 20 MG tablet. Pt says its about the change in dosage from 25 to 20MG . Please call patient or pharmacy back.

## 2020-05-20 NOTE — Telephone Encounter (Signed)
Spoke to the pharmacy and advised that the lisinopril 20mg  plain is the appropriate medication and they said the insurance wouldn't cover since it hasn't been 30 days since her last fill of the lisinopril/hctz 20/25 and I advised that it should since it is a medication changed and they should be able to put that in to get her medication approved.

## 2020-05-26 DIAGNOSIS — Z960 Presence of urogenital implants: Secondary | ICD-10-CM | POA: Diagnosis not present

## 2020-05-27 NOTE — Progress Notes (Signed)
This encounter was created in error - please disregard.

## 2020-06-04 ENCOUNTER — Telehealth (HOSPITAL_COMMUNITY): Payer: Self-pay | Admitting: Physical Therapy

## 2020-06-04 ENCOUNTER — Encounter (HOSPITAL_COMMUNITY): Payer: Self-pay

## 2020-06-04 ENCOUNTER — Ambulatory Visit (HOSPITAL_COMMUNITY): Payer: Medicare Other | Admitting: Physical Therapy

## 2020-06-04 NOTE — Telephone Encounter (Signed)
pt called to cancel today's appt due to her husband is having surgery today

## 2020-06-09 ENCOUNTER — Encounter: Payer: Self-pay | Admitting: Family

## 2020-06-09 ENCOUNTER — Ambulatory Visit (INDEPENDENT_AMBULATORY_CARE_PROVIDER_SITE_OTHER): Payer: Medicare Other | Admitting: Family

## 2020-06-09 ENCOUNTER — Other Ambulatory Visit: Payer: Self-pay

## 2020-06-09 VITALS — BP 174/85 | HR 72 | Temp 97.5°F | Ht 66.0 in | Wt 156.0 lb

## 2020-06-09 DIAGNOSIS — R42 Dizziness and giddiness: Secondary | ICD-10-CM | POA: Diagnosis not present

## 2020-06-09 DIAGNOSIS — M810 Age-related osteoporosis without current pathological fracture: Secondary | ICD-10-CM | POA: Diagnosis not present

## 2020-06-09 DIAGNOSIS — N183 Chronic kidney disease, stage 3 unspecified: Secondary | ICD-10-CM | POA: Diagnosis not present

## 2020-06-09 DIAGNOSIS — E785 Hyperlipidemia, unspecified: Secondary | ICD-10-CM

## 2020-06-09 DIAGNOSIS — I10 Essential (primary) hypertension: Secondary | ICD-10-CM

## 2020-06-09 NOTE — Progress Notes (Signed)
Subjective:    Patient ID: Erin Khan, female    DOB: 1941/05/21, 79 y.o.   MRN: [id number]  Chief Complaint  Patient presents with  . Hypertension   PT presents to the office today for chronic follow up. She is followed by GYN annually. She is followed by Urologists every 3 months vaginal prolapse and pessary maintenance.   She is followed by ENT for dizziness.   Her BP is elevated today, but states she has not taken her BP medication today.  Hypertension This is a chronic problem. The current episode started more than 1 year ago. The problem has been waxing and waning since onset. The problem is uncontrolled. Associated symptoms include peripheral edema. Pertinent negatives include no malaise/fatigue or shortness of breath. Risk factors for coronary artery disease include dyslipidemia, obesity and sedentary lifestyle. The current treatment provides moderate improvement. There is no history of kidney disease.  Hyperlipidemia This is a chronic problem. The current episode started more than 1 year ago. The problem is uncontrolled. Recent lipid tests were reviewed and are high. Pertinent negatives include no shortness of breath. Current antihyperlipidemic treatment includes diet change. The current treatment provides mild improvement of lipids. Risk factors for coronary artery disease include hypertension, a sedentary lifestyle, post-menopausal and dyslipidemia.  Osteoporosis      Review of Systems  Constitutional: Negative for malaise/fatigue.  Respiratory: Negative for shortness of breath.        Objective:   Physical Exam Vitals reviewed.  Constitutional:      General: She is not in acute distress.    Appearance: She is well-developed.  HENT:     Head: Normocephalic and atraumatic.     Right Ear: Tympanic membrane normal.     Left Ear: Tympanic membrane normal.  Eyes:     Pupils: Pupils are equal, round, and reactive to light.  Neck:     Thyroid: No thyromegaly.    Cardiovascular:     Rate and Rhythm: Normal rate and regular rhythm.     Heart sounds: Normal heart sounds. No murmur heard.   Pulmonary:     Effort: Pulmonary effort is normal. No respiratory distress.     Breath sounds: Normal breath sounds. No wheezing.  Abdominal:     General: Bowel sounds are normal. There is no distension.     Palpations: Abdomen is soft.     Tenderness: There is no abdominal tenderness.  Musculoskeletal:        General: No tenderness. Normal range of motion.     Cervical back: Normal range of motion and neck supple.     Right lower leg: Edema (trace ) present.     Left lower leg: Edema (trace) present.  Skin:    General: Skin is warm and dry.  Neurological:     Mental Status: She is alert and oriented to person, place, and time.     Cranial Nerves: No cranial nerve deficit.     Deep Tendon Reflexes: Reflexes are normal and symmetric.  Psychiatric:        Behavior: Behavior normal.        Thought Content: Thought content normal.        Judgment: Judgment normal.       BP (!) 174/85   Pulse 72   Temp (!) 97.5 F (36.4 C) (Temporal)   Ht _0  (1.676 m)   Wt 156 lb (70.8 kg)   BMI 25.18 kg/m      Assessment & Plan:  Erin Khan comes in today with chief complaint of Hypertension   Diagnosis and orders addressed:  1. Primary hypertension PT will come recheck BP a day she has taken her medications -Dash diet information given -Exercise encouraged - Stress Management  -Continue current  medications  - BMP8+EGFR  2. Age-related osteoporosis without current pathological fracture - BMP8+EGFR  3. Stage 3 chronic kidney disease, unspecified whether stage 3a or 3b CKD (HCC) - BMP8+EGFR  4. Hyperlipidemia, unspecified hyperlipidemia type - BMP8+EGFR  5. Dizziness   Labs pending Health Maintenance reviewed Diet and exercise encouraged  Follow up plan: 6 months   Evelina Dun, FNP

## 2020-06-09 NOTE — Patient Instructions (Signed)

## 2020-06-10 LAB — BMP8+EGFR
BUN/Creatinine Ratio: 21 (ref 12–28)
BUN: 28 mg/dL — ABNORMAL HIGH (ref 8–27)
CO2: 23 mmol/L (ref 20–29)
Calcium: 9.3 mg/dL (ref 8.7–10.3)
Chloride: 108 mmol/L — ABNORMAL HIGH (ref 96–106)
Creatinine, Ser: 1.35 mg/dL — ABNORMAL HIGH (ref 0.57–1.00)
GFR calc Af Amer: 43 mL/min/{1.73_m2} — ABNORMAL LOW (ref >59)
GFR calc non Af Amer: 38 mL/min/{1.73_m2} — ABNORMAL LOW (ref >59)
Glucose: 96 mg/dL (ref 65–99)
Potassium: 4.4 mmol/L (ref 3.5–5.2)
Sodium: 144 mmol/L (ref 134–144)

## 2020-06-15 ENCOUNTER — Other Ambulatory Visit: Payer: Self-pay

## 2020-06-15 ENCOUNTER — Ambulatory Visit (HOSPITAL_COMMUNITY)
Admission: RE | Admit: 2020-06-15 | Discharge: 2020-06-15 | Disposition: A | Discharge status: Home / Self Care | Payer: Medicare Other | Source: Ambulatory Visit | Attending: Obstetrics and Gynecology | Admitting: Obstetrics and Gynecology

## 2020-06-15 DIAGNOSIS — Z1231 Encounter for screening mammogram for malignant neoplasm of breast: Secondary | ICD-10-CM | POA: Diagnosis not present

## 2020-06-22 ENCOUNTER — Other Ambulatory Visit: Payer: Self-pay

## 2020-06-22 ENCOUNTER — Ambulatory Visit (HOSPITAL_COMMUNITY): Payer: Medicare Other | Attending: Otolaryngology | Admitting: Physical Therapy

## 2020-06-22 DIAGNOSIS — H8113 Benign paroxysmal vertigo, bilateral: Secondary | ICD-10-CM | POA: Diagnosis not present

## 2020-06-22 NOTE — Therapy (Signed)
[place], [name], [address] Phone: [phone]   Fax:  [phone]  Physical Therapy Evaluation  Patient Details  Name: Erin Khan MRN: [id number] Date of Birth: 04-02-1941 Referring Provider (PT): Leta Baptist   Encounter Date: 06/22/2020   PT End of Session - 06/22/20 1634    Visit Number 1    Number of Visits 12    Date for PT Re-Evaluation 07/22/20    Authorization Type [employer] mdicare    Progress Note Due on Visit 10    PT Start Time 1455    PT Stop Time 1540    PT Time Calculation (min) 45 min    Activity Tolerance Patient tolerated treatment well    Behavior During Therapy [facility] for tasks assessed/performed           Past Medical History:  Diagnosis Date  . Cataract   . Colon polyps   . Hypercholesteremia   . Hypertension     Past Surgical History:  Procedure Laterality Date  . ABDOMINAL HYSTERECTOMY    . BILATERAL OOPHORECTOMY    . CATARACT EXTRACTION, BILATERAL  2014  . CHOLECYSTECTOMY  1980  . COLONOSCOPY    . COLONOSCOPY N/A 05/15/2013   Procedure: COLONOSCOPY;  Surgeon: Rogene Houston, MD;  [facility];  Service: Endoscopy;  Laterality: N/A;  930  . COLONOSCOPY N/A 10/18/2018   Procedure: COLONOSCOPY;  Surgeon: Rogene Houston, MD;  [facility];  Service: Endoscopy;  Laterality: N/A;  830  . cranial shunt     17 years ago  . POLYPECTOMY  10/18/2018   Procedure: POLYPECTOMY;  Surgeon: Rogene Houston, MD;  [facility];  Service: Endoscopy;;  colon    There were no vitals filed for this visit.    Subjective Assessment - 06/22/20 1431    Subjective Erin Khan is a 79 yo female who has been having chronic BPPV since February.  She states that she has the dizziness everyday, when it occurs it lasts for an hour or two.  If she looks up, stands up or turns she has increased dizziness.  She was tested at the MD office twice both times she had a + Dix-halpike  manuever test on the RT and (-) on the left.  Despite having the Epley's manuver done both times she continues to have dizziness therefore she has been referred to skilled PT>    Currently in Pain? No/denies              [facility] PT Assessment - 06/22/20 0001      Assessment   Medical Diagnosis BPPV     Referring Provider (PT) Su Teoh    Onset Date/Surgical Date 04/12/20    Prior Therapy Rt Epley maneuver in MD office in August and September       Precautions   Precautions Fall      Restrictions   Weight Bearing Restrictions No      Balance Screen   Has the patient fallen in the past 6 months No    Has the patient had a decrease in activity level because of a fear of falling?  Yes    Is the patient reluctant to leave their home because of a fear of falling?  No      Home Ecologist residence      Prior Function   Level of Independence Independent  Cognition   Overall Cognitive Status Within Functional Limits for tasks assessed      Functional Tests   Functional tests Single leg stance      Single Leg Stance   Comments Lt 4 seconds Rt 0       ROM / Strength   AROM / PROM / Strength AROM      AROM   AROM Assessment Site Cervical   WFL                  Vestibular Assessment - 06/22/20 0001      Symptom Behavior   Subjective history of current problem Pt states she is bothered the most by looking up into a top shelf at the store.  She  has dizziness multiple times a day.  At least 4 x a day anytime she walks any distance     Type of Dizziness  Imbalance    Frequency of Dizziness at least 4 x     Duration of Dizziness stays as long as she is walking , immediately goes away when she sits down.     Symptom Nature Motion provoked    Aggravating Factors Looking up to the ceiling    Relieving Factors Comments    Progression of Symptoms --   sittiing      Oculomotor Exam   Ocular ROM --   poor trajectory to RT    Head shaking  Horizontal Absent   but slightly dizzy    Smooth Pursuits Saccades      Oculomotor Exam-Fixation Suppressed    Ocular Alignment wfl     Ocular ROM wfl     Spontaneous Nystagmus no    Gaze evoked nystagmus yes       Vestibulo-Ocular Reflex   VOR 1 Head Only (x 1 viewing) no problem     VOR 2 Head and Object (x 2 viewing) slght dizziness       Positional Testing   Dix-Hallpike Dix-Hallpike Right;Dix-Hallpike Left      Dix-Hallpike Right   Dix-Hallpike Right Duration 7 seconds      Dix-Hallpike Right Symptoms Upbeat, right rotatory nystagmus      Dix-Hallpike Left   Dix-Hallpike Left Duration 15 seconds     Dix-Hallpike Left Symptoms Upbeat, right rotatory nystagmus      Cognition   Cognition Orientation Level Appropriate for developmental age      Positional Sensitivities   Right Hallpike Moderate dizziness    Up from Right Hallpike Mild dizziness    Up from Left Hallpike Mild dizziness              Objective measurements completed on examination: See above findings.        Vestibular Treatment/Exercise - 06/22/20 0001      Vestibular Treatment/Exercise   Vestibular Treatment Provided Canalith Repositioning;Gaze    Canalith Repositioning Epley Manuever Left    Gaze Exercises X1 Viewing Horizontal;X1 Viewing Vertical       EPLEY MANUEVER LEFT   Number of Reps  2    Overall Response  Improved Symptoms     RESPONSE DETAILS LEFT decreased nysatgmus second time       X1 Viewing Horizontal   Foot Position floor    Reps 5      X1 Viewing Vertical   Foot Position floor     Reps 5                 PT Education - 06/22/20 8938  Education Details HEP for eye gaze and saccades.    Person(s) Educated Patient    Methods Explanation;Handout    Comprehension Verbalized understanding;Verbal cues required;Returned demonstration            PT Short Term Goals - 06/22/20 1641      PT SHORT TERM GOAL #1   Title Pt to only be having 2 or less bouts of  dizziness a day    Time 2    Period Weeks    Status New    Target Date 07/06/20      PT SHORT TERM GOAL #2   Title PT to be able to single leg stance on both LE for at least 15 seconds to decrease risk of falling    Time 2    Period Weeks    Status New             PT Long Term Goals - 06/22/20 1642      PT LONG TERM GOAL #1   Title PT to no longer be having any bouts of dizziness    Time 4    Period Weeks    Status New    Target Date 07/20/20      PT LONG TERM GOAL #2   Title Pt to be able to single leg stance for at least 25 seconds on both LE to decrease risk of falling    Time 4    Period Weeks    Status New                  Plan - 06/22/20 1635    Clinical Impression Statement Ms. Fearn is a 79 yo female who has been experiencing at least 4 bouts of dizziness a day since February.  She has been referred to skilled PT from her MD for vestibular therapy.  Evaluation demonstrates decreased balance, and positive BPPV.  The pt is + for both Rt and Lt Eply manuever with the Lt side having greater symptoms.  Ms.  Peery will benefit from skilled PT to reduce her sx of dizziness and improve her balance to decrease her risk of falling.    Personal Factors and Comorbidities Past/Current Experience;Time since onset of injury/illness/exacerbation    Examination-Activity Limitations Carry;Lift    Examination-Participation Restrictions Laundry;Yard Work;Cleaning;Meal Prep    Stability/Clinical Decision Making Evolving/Moderate complexity    Clinical Decision Making Moderate    Rehab Potential Good    PT Frequency 3x / week    PT Duration 4 weeks    PT Treatment/Interventions Therapeutic activities;Balance training;Therapeutic exercise;Patient/family education;Other (comment)   cannalith repositioning   PT Next Visit Plan Complete Dix-halpike and Eplys if needed begin balance exercises tandem with head rotation, nods....    PT Home Exercise Plan eye gaze and VOR 1            Patient will benefit from skilled therapeutic intervention in order to improve the following deficits and impairments:  Decreased balance, Dizziness  Visit Diagnosis: BPPV (benign paroxysmal positional vertigo), bilateral     Problem List Patient Active Problem List   Diagnosis Date Noted  . Vaginal vault prolapse 10/10/2019  . History of colonic polyps 05/29/2018  . CKD (chronic kidney disease), stage III ([place]) 07/07/2017  . History of brain shunt 12/08/2016  . Osteoporosis 12/08/2016  . Hypertriglyceridemia 04/18/2016  . HTN (hypertension) 03/21/2016  . Hyperlipidemia 03/21/2016  . Presence of pessary 10/14/2015    [name], PT CLT [phone] 06/22/2020, 4:44 PM  [facility]  [place], [name], [address] Phone: [phone]   Fax:  [phone]  Name: Erin Khan MRN: [id number] Date of Birth: [birth year]

## 2020-06-24 ENCOUNTER — Ambulatory Visit (HOSPITAL_COMMUNITY): Payer: Medicare Other | Admitting: Physical Therapy

## 2020-06-24 ENCOUNTER — Other Ambulatory Visit: Payer: Self-pay

## 2020-06-24 ENCOUNTER — Encounter (HOSPITAL_COMMUNITY): Payer: Self-pay | Admitting: Physical Therapy

## 2020-06-24 DIAGNOSIS — H8113 Benign paroxysmal vertigo, bilateral: Secondary | ICD-10-CM

## 2020-06-24 NOTE — Therapy (Signed)
[place], [name], [address] Phone: [phone]   Fax:  [phone]  Physical Therapy Treatment  Patient Details  Name: Erin Khan MRN: [id number] Date of Birth: [birth year] Referring Provider (PT): Leta Baptist   Encounter Date: 06/24/2020   PT End of Session - 06/24/20 1357    Visit Number 2    Number of Visits 12    Date for PT Re-Evaluation 07/22/20    Authorization Type [employer] mdicare    Progress Note Due on Visit 10    PT Start Time 1315    PT Stop Time 1355    PT Time Calculation (min) 40 min    Activity Tolerance Patient tolerated treatment well    Behavior During Therapy [facility] for tasks assessed/performed           Past Medical History:  Diagnosis Date  . Cataract   . Colon polyps   . Hypercholesteremia   . Hypertension     Past Surgical History:  Procedure Laterality Date  . ABDOMINAL HYSTERECTOMY    . BILATERAL OOPHORECTOMY    . CATARACT EXTRACTION, BILATERAL  2014  . CHOLECYSTECTOMY  1980  . COLONOSCOPY    . COLONOSCOPY N/A 05/15/2013   Procedure: COLONOSCOPY;  Surgeon: Rogene Houston, MD;  [facility];  Service: Endoscopy;  Laterality: N/A;  930  . COLONOSCOPY N/A 10/18/2018   Procedure: COLONOSCOPY;  Surgeon: Rogene Houston, MD;  [facility];  Service: Endoscopy;  Laterality: N/A;  830  . cranial shunt     17 years ago  . POLYPECTOMY  10/18/2018   Procedure: POLYPECTOMY;  Surgeon: Rogene Houston, MD;  [facility];  Service: Endoscopy;;  colon    There were no vitals filed for this visit.   Subjective Assessment - 06/24/20 1314    Subjective Pt states she has no questions on her exercises.  She feels better and has not been as dizzy as she had been.    Pertinent History brain shunt checked this year, OA, osteoporosisi                             [facility] Adult PT Treatment/Exercise - 06/24/20 0001      Exercises   Exercises Neck       Neck Exercises: Seated   Postural Training x5    Other Seated Exercise scapular retraction x 10      Manual Therapy   Manual Therapy Soft tissue mobilization    Soft tissue mobilization B upper trap to decrease spasm           Vestibular Treatment/Exercise - 06/24/20 0001      Vestibular Treatment/Exercise   Vestibular Treatment Provided Canalith Repositioning;Gaze    Canalith Repositioning Epley Manuever Right    Gaze Exercises X1 Viewing Horizontal;X1 Viewing Vertical       EPLEY MANUEVER LEFT   Number of Reps  2    Overall Response  Improved Symptoms      X1 Viewing Horizontal   Foot Position --    Reps --      X1 Viewing Vertical   Foot Position --    Reps --                 PT Education - 06/24/20 1356    Education Details HEP self cannalith repositioning and posture    Person(s) Educated Patient  Methods Explanation;Handout    Comprehension Verbalized understanding;Returned demonstration            PT Short Term Goals - 06/24/20 1400      PT SHORT TERM GOAL #1   Title Pt to only be having 2 or less bouts of dizziness a day    Time 2    Period Weeks    Status On-going    Target Date 07/06/20      PT SHORT TERM GOAL #2   Title PT to be able to single leg stance on both LE for at least 15 seconds to decrease risk of falling    Time 2    Period Weeks    Status On-going             PT Long Term Goals - 06/24/20 1401      PT LONG TERM GOAL #1   Title PT to no longer be having any bouts of dizziness    Time 4    Period Weeks    Status On-going      PT LONG TERM GOAL #2   Title Pt to be able to single leg stance for at least 25 seconds on both LE to decrease risk of falling    Time 4    Period Weeks    Status On-going                 Plan - 06/24/20 1357    Clinical Impression Statement Dix-Halpike  manuever completed with (-) on LT; (+) on Rt.  Pt responded well to  eplys manuever. Therapist completed manual to upper  trap B due to increased spasm.  Therapist educated that pt that good posture cane assist in decreasing risk of crystals getting "caught".    Personal Factors and Comorbidities Past/Current Experience;Time since onset of injury/illness/exacerbation    Examination-Activity Limitations Carry;Lift    Examination-Participation Restrictions Laundry;Yard Work;Cleaning;Meal Prep    Stability/Clinical Decision Making Evolving/Moderate complexity    Rehab Potential Good    PT Frequency 3x / week    PT Duration 4 weeks    PT Treatment/Interventions Therapeutic activities;Balance training;Therapeutic exercise;Patient/family education;Other (comment)   cannalith repositioning   PT Next Visit Plan Complete Dix-halpike and Eplys if needed.  Once pt no longer needs Eplys  begin balance exercises tandem with head rotation, nods....    PT Home Exercise Plan eye gaze and VOR 1; 10/27: eply manuever for home, posture and scapular retraction.           Patient will benefit from skilled therapeutic intervention in order to improve the following deficits and impairments:  Decreased balance, Dizziness  Visit Diagnosis: BPPV (benign paroxysmal positional vertigo), bilateral     Problem List Patient Active Problem List   Diagnosis Date Noted  . Vaginal vault prolapse 10/10/2019  . History of colonic polyps 05/29/2018  . CKD (chronic kidney disease), stage III ([place]) 07/07/2017  . History of brain shunt 12/08/2016  . Osteoporosis 12/08/2016  . Hypertriglyceridemia 04/18/2016  . HTN (hypertension) 03/21/2016  . Hyperlipidemia 03/21/2016  . Presence of pessary 10/14/2015    [name], PT CLT [phone] 06/24/2020, 2:03 PM  [facility] [address] [place], [name], [address] Phone: [phone]   Fax:  [phone]  Name: Erin Khan MRN: [id number] Date of Birth: [birth year]

## 2020-06-24 NOTE — Patient Instructions (Signed)

## 2020-06-25 ENCOUNTER — Encounter (HOSPITAL_COMMUNITY): Payer: Self-pay | Admitting: Physical Therapy

## 2020-06-25 ENCOUNTER — Ambulatory Visit (HOSPITAL_COMMUNITY): Payer: Medicare Other | Admitting: Physical Therapy

## 2020-06-25 DIAGNOSIS — H8113 Benign paroxysmal vertigo, bilateral: Secondary | ICD-10-CM | POA: Diagnosis not present

## 2020-06-25 NOTE — Therapy (Signed)
[place], [name], [address] Phone: [phone]   Fax:  [phone]  Physical Therapy Treatment  Patient Details  Name: Erin Khan MRN: [id number] Date of Birth: Jan 03, 1941 Referring Provider (PT): Leta Baptist   Encounter Date: 06/25/2020   PT End of Session - 06/25/20 1333    Visit Number 3    Number of Visits 12    Date for PT Re-Evaluation 07/22/20    Authorization Type [employer] mdicare    Progress Note Due on Visit 10    PT Start Time 1320    PT Stop Time 1405    PT Time Calculation (min) 45 min    Activity Tolerance Patient tolerated treatment well    Behavior During Therapy [facility] for tasks assessed/performed           Past Medical History:  Diagnosis Date  . Cataract   . Colon polyps   . Hypercholesteremia   . Hypertension     Past Surgical History:  Procedure Laterality Date  . ABDOMINAL HYSTERECTOMY    . BILATERAL OOPHORECTOMY    . CATARACT EXTRACTION, BILATERAL  2014  . CHOLECYSTECTOMY  1980  . COLONOSCOPY    . COLONOSCOPY N/A 05/15/2013   Procedure: COLONOSCOPY;  Surgeon: Rogene Houston, MD;  [facility];  Service: Endoscopy;  Laterality: N/A;  930  . COLONOSCOPY N/A 10/18/2018   Procedure: COLONOSCOPY;  Surgeon: Rogene Houston, MD;  [facility];  Service: Endoscopy;  Laterality: N/A;  830  . cranial shunt     17 years ago  . POLYPECTOMY  10/18/2018   Procedure: POLYPECTOMY;  Surgeon: Rogene Houston, MD;  [facility];  Service: Endoscopy;;  colon    There were no vitals filed for this visit.   Subjective Assessment - 06/25/20 1332    Subjective Patient says she is doing well. Has not had any issues with dizziness since last visit. Says her neck muscles are a little sore where she was worked on last time. Says she would like to work on her balance today.    Pertinent History brain shunt checked this year, OA, osteoporosisi    Currently in Pain? No/denies                               Vestibular Treatment/Exercise - 06/25/20 0001      Vestibular Treatment/Exercise   Gaze Exercises --   x20     X1 Viewing Horizontal   Comments VOR 1 x20; Saccades x20; seated head turns x10      X1 Viewing Vertical   Comments seated head nods x10               Balance Exercises - 06/25/20 0001      Balance Exercises: Standing   Standing Eyes Opened Narrow base of support (BOS);Solid surface;2 reps;30 secs    Standing Eyes Closed Narrow base of support (BOS);Foam/compliant surface;Solid surface;2 reps;30 secs   2 reps solid floor; 2 reps on foam    Tandem Stance Eyes open;2 reps;30 secs             PT Education - 06/25/20 1432    Education Details Educated patient on BPPV, anatomy of inner ear, prognosis and purpose of vestibular testing as well as todays progression to static balance activity.    Person(s) Educated Patient    Methods Explanation;Handout  Comprehension Verbalized understanding            PT Short Term Goals - 06/24/20 1400      PT SHORT TERM GOAL #1   Title Pt to only be having 2 or less bouts of dizziness a day    Time 2    Period Weeks    Status On-going    Target Date 07/06/20      PT SHORT TERM GOAL #2   Title PT to be able to single leg stance on both LE for at least 15 seconds to decrease risk of falling    Time 2    Period Weeks    Status On-going             PT Long Term Goals - 06/24/20 1401      PT LONG TERM GOAL #1   Title PT to no longer be having any bouts of dizziness    Time 4    Period Weeks    Status On-going      PT LONG TERM GOAL #2   Title Pt to be able to single leg stance for at least 25 seconds on both LE to decrease risk of falling    Time 4    Period Weeks    Status On-going                 Plan - 06/25/20 1411    Clinical Impression Statement Patient tolerated session well today. Unable to incite dizziness with seated head movements,  saccades or VOR. Progressed to standing static balance. No indication for Eppley retest today as patient reports no longer having dizziness/vertigo with positional changes. Patient demos good static standing balance on solid surface, but was challenged with static balance on compliant foam surface. Patient with increased sway and unsteadiness with standing Romberg stance on foam with eyes closed. Added tandem stance for static balance progression. Patient educated on proper form and function. Added to HEP, patient educated and issued handout. Educated patient on BPPV, anatomy of inner ear, prognosis and purpose of vestibular testing as well as todays progression to static balance activity.    Personal Factors and Comorbidities Past/Current Experience;Time since onset of injury/illness/exacerbation    Examination-Activity Limitations Carry;Lift    Examination-Participation Restrictions Laundry;Yard Work;Cleaning;Meal Prep    Stability/Clinical Decision Making Evolving/Moderate complexity    Rehab Potential Good    PT Frequency 3x / week    PT Duration 4 weeks    PT Treatment/Interventions Therapeutic activities;Balance training;Therapeutic exercise;Patient/family education;Other (comment)   cannalith repositioning   PT Next Visit Plan Continue balance exercises. Progress from static> complaint surface> dynamic (tandem stance/ on foam, step up/ power ups, SLS, NBOS with head turns)    PT Home Exercise Plan eye gaze and VOR 1; 10/27: eply manuever for home, posture and scapular retraction. 06/25/20: tandem stance at sink    Consulted and Agree with Plan of Care Patient           Patient will benefit from skilled therapeutic intervention in order to improve the following deficits and impairments:  Decreased balance, Dizziness  Visit Diagnosis: BPPV (benign paroxysmal positional vertigo), bilateral     Problem List Patient Active Problem List   Diagnosis Date Noted  . Vaginal vault prolapse  10/10/2019  . History of colonic polyps 05/29/2018  . CKD (chronic kidney disease), stage III ([place]) 07/07/2017  . History of brain shunt 12/08/2016  . Osteoporosis 12/08/2016  . Hypertriglyceridemia 04/18/2016  . HTN (hypertension) 03/21/2016  .  Hyperlipidemia 03/21/2016  . Presence of pessary 10/14/2015    2:34 PM, 06/25/20 Josue Hector PT DPT  Physical Therapist with [facility]  [phone]   [facility] [address] [place], [name], [address] Phone: [phone]   Fax:  [phone]  Name: Erin Khan MRN: [id number] Date of Birth: [birth year]

## 2020-06-25 NOTE — Patient Instructions (Signed)
Access Code: [email or web address] URL: [email or web address] Date: 06/25/2020 Prepared by: Josue Hector  Exercises Standing Tandem Balance with Counter Support - 2 x daily - 7 x weekly - 1 sets - 3 reps - 30 seconds hold

## 2020-06-30 ENCOUNTER — Other Ambulatory Visit: Payer: Self-pay

## 2020-06-30 ENCOUNTER — Encounter (HOSPITAL_COMMUNITY): Payer: Self-pay | Admitting: Physical Therapy

## 2020-06-30 ENCOUNTER — Ambulatory Visit (HOSPITAL_COMMUNITY): Payer: Medicare Other | Attending: Otolaryngology | Admitting: Physical Therapy

## 2020-06-30 DIAGNOSIS — H8113 Benign paroxysmal vertigo, bilateral: Secondary | ICD-10-CM | POA: Diagnosis not present

## 2020-06-30 DIAGNOSIS — Z9181 History of falling: Secondary | ICD-10-CM | POA: Insufficient documentation

## 2020-06-30 NOTE — Therapy (Signed)
[place], [name], [address] Phone: [phone]   Fax:  [phone]  Physical Therapy Treatment  Patient Details  Name: Erin Khan MRN: [id number] Date of Birth: May 18, 1941 Referring Provider (PT): Leta Baptist   Encounter Date: 06/30/2020   PT End of Session - 06/30/20 1445    Visit Number 4    Number of Visits 12    Date for PT Re-Evaluation 07/22/20    Authorization Type [employer] mdicare    Progress Note Due on Visit 10    PT Start Time 1694    PT Stop Time 1518    PT Time Calculation (min) 40 min    Activity Tolerance Patient tolerated treatment well    Behavior During Therapy [facility] for tasks assessed/performed           Past Medical History:  Diagnosis Date  . Cataract   . Colon polyps   . Hypercholesteremia   . Hypertension     Past Surgical History:  Procedure Laterality Date  . ABDOMINAL HYSTERECTOMY    . BILATERAL OOPHORECTOMY    . CATARACT EXTRACTION, BILATERAL  2014  . CHOLECYSTECTOMY  1980  . COLONOSCOPY    . COLONOSCOPY N/A 05/15/2013   Procedure: COLONOSCOPY;  Surgeon: Rogene Houston, MD;  [facility];  Service: Endoscopy;  Laterality: N/A;  930  . COLONOSCOPY N/A 10/18/2018   Procedure: COLONOSCOPY;  Surgeon: Rogene Houston, MD;  [facility];  Service: Endoscopy;  Laterality: N/A;  830  . cranial shunt     17 years ago  . POLYPECTOMY  10/18/2018   Procedure: POLYPECTOMY;  Surgeon: Rogene Houston, MD;  [facility];  Service: Endoscopy;;  colon    There were no vitals filed for this visit.   Subjective Assessment - 06/30/20 1445    Subjective Patient says she is doing well. No issues with dizziness except once yesterday. Says she had a headache which flared her dizziness but nothing to do with position changes. Lasted less than 5 minutes.    Pertinent History brain shunt checked this year, OA, osteoporosisi    Currently in Pain? No/denies                                   Balance Exercises - 06/30/20 0001      Balance Exercises: Standing   Standing Eyes Opened Narrow base of support (BOS);2 reps;30 secs;Foam/compliant surface    Tandem Stance Eyes open;2 reps;30 secs    SLS Eyes open;Intermittent upper extremity support;3 reps;10 secs    Step Ups Forward;4 inch;UE support 1   x10 each    Other Standing Exercises standing on foam with head turns x 10 horiz, x 10 vertical     Other Standing Exercises Comments step up on 4 inch box with power ups x 10 each intermittent HHA                PT Short Term Goals - 06/24/20 1400      PT SHORT TERM GOAL #1   Title Pt to only be having 2 or less bouts of dizziness a day    Time 2    Period Weeks    Status On-going    Target Date 07/06/20      PT SHORT TERM GOAL #2   Title PT to be able to single leg stance on  both LE for at least 15 seconds to decrease risk of falling    Time 2    Period Weeks    Status On-going             PT Long Term Goals - 06/24/20 1401      PT LONG TERM GOAL #1   Title PT to no longer be having any bouts of dizziness    Time 4    Period Weeks    Status On-going      PT LONG TERM GOAL #2   Title Pt to be able to single leg stance for at least 25 seconds on both LE to decrease risk of falling    Time 4    Period Weeks    Status On-going                 Plan - 06/30/20 1554    Clinical Impression Statement Patient tolerated session well today. Able to progress static and dynamic balance activity with no increase in dizziness. Unable to provoke symptoms with todays activity. Patient educated on proper form and function of all added exercises. Patient was well challenged with single limb balance but showing good stability overall. Patient says she may want next visit to be last because she is feeling much better and not having many issues at the moment. She feels therapy has been very helpful this far. Plan to  progress dynamic balance and if patient is doing well and reporting no symptoms, possible DC next visit to HEP. Educate patient on self Eppley/ Nestor Lewandowsky exercise if applicable.    Personal Factors and Comorbidities Past/Current Experience;Time since onset of injury/illness/exacerbation    Examination-Activity Limitations Carry;Lift    Examination-Participation Restrictions Laundry;Yard Work;Cleaning;Meal Prep    Stability/Clinical Decision Making Evolving/Moderate complexity    Rehab Potential Good    PT Frequency 3x / week    PT Duration 4 weeks    PT Treatment/Interventions Therapeutic activities;Balance training;Therapeutic exercise;Patient/family education;Other (comment)   cannalith repositioning   PT Next Visit Plan progress dynamic balance and if patient is doing well and reporting no symptoms, possible DC next visit to HEP. Educate patient on self Eppley/ Nestor Lewandowsky exercise if applicable.    PT Home Exercise Plan eye gaze and VOR 1; 10/27: eply manuever for home, posture and scapular retraction. 06/25/20: tandem stance at sink 06/30/20: standing head turns and nods at counter, single leg stand at counter    Consulted and Agree with Plan of Care Patient           Patient will benefit from skilled therapeutic intervention in order to improve the following deficits and impairments:  Decreased balance, Dizziness  Visit Diagnosis: BPPV (benign paroxysmal positional vertigo), bilateral     Problem List Patient Active Problem List   Diagnosis Date Noted  . Vaginal vault prolapse 10/10/2019  . History of colonic polyps 05/29/2018  . CKD (chronic kidney disease), stage III ([place]) 07/07/2017  . History of brain shunt 12/08/2016  . Osteoporosis 12/08/2016  . Hypertriglyceridemia 04/18/2016  . HTN (hypertension) 03/21/2016  . Hyperlipidemia 03/21/2016  . Presence of pessary 10/14/2015   4:04 PM, 06/30/20 Josue Hector PT DPT  Physical Therapist with [facility]  [phone]   [facility] [address] [place], [name], [address] Phone: [phone]   Fax:  [phone]  Name: Erin Khan MRN: [id number] Date of Birth: 04-26-1941

## 2020-07-02 ENCOUNTER — Other Ambulatory Visit: Payer: Self-pay

## 2020-07-02 ENCOUNTER — Ambulatory Visit (HOSPITAL_COMMUNITY): Payer: Medicare Other | Admitting: Physical Therapy

## 2020-07-02 DIAGNOSIS — H8113 Benign paroxysmal vertigo, bilateral: Secondary | ICD-10-CM | POA: Diagnosis not present

## 2020-07-02 DIAGNOSIS — Z9181 History of falling: Secondary | ICD-10-CM | POA: Diagnosis not present

## 2020-07-02 NOTE — Therapy (Signed)
[place], [name], [address] Phone: [phone]   Fax:  [phone]  Physical Therapy Treatment  Patient Details  Name: Erin Khan MRN: [id number] Date of Birth: 08-31-1940 Referring Provider (PT): Leta Baptist   Encounter Date: 07/02/2020   PT End of Session - 07/02/20 3734    Visit Number 5    Number of Visits 12    Date for PT Re-Evaluation 07/22/20    Authorization Type [employer] mdicare    Progress Note Due on Visit 10    PT Start Time 1315    PT Stop Time 1355    PT Time Calculation (min) 40 min    Activity Tolerance Patient tolerated treatment well    Behavior During Therapy [facility] for tasks assessed/performed           Past Medical History:  Diagnosis Date  . Cataract   . Colon polyps   . Hypercholesteremia   . Hypertension     Past Surgical History:  Procedure Laterality Date  . ABDOMINAL HYSTERECTOMY    . BILATERAL OOPHORECTOMY    . CATARACT EXTRACTION, BILATERAL  2014  . CHOLECYSTECTOMY  1980  . COLONOSCOPY    . COLONOSCOPY N/A 05/15/2013   Procedure: COLONOSCOPY;  Surgeon: Rogene Houston, MD;  [facility];  Service: Endoscopy;  Laterality: N/A;  930  . COLONOSCOPY N/A 10/18/2018   Procedure: COLONOSCOPY;  Surgeon: Rogene Houston, MD;  [facility];  Service: Endoscopy;  Laterality: N/A;  830  . cranial shunt     17 years ago  . POLYPECTOMY  10/18/2018   Procedure: POLYPECTOMY;  Surgeon: Rogene Houston, MD;  [facility];  Service: Endoscopy;;  colon    There were no vitals filed for this visit.   Subjective Assessment - 07/02/20 1314    Subjective Pt states that she has only been dizzy one time yesterday and one time today.    Pertinent History brain shunt checked this year, OA, osteoporosis                   Vestibular Assessment - 07/02/20 0001      Positional Testing   Dix-Hallpike Dix-Hallpike Right;Dix-Hallpike Left      Dix-Hallpike  Right   Dix-Hallpike Right Duration 12 seconds     Dix-Hallpike Right Symptoms Upbeat, right rotatory nystagmus      Dix-Hallpike Left   Dix-Hallpike Left Symptoms No nystagmus                     Vestibular Treatment/Exercise - 07/02/20 0001      Vestibular Treatment/Exercise   Vestibular Treatment Provided Canalith Repositioning       EPLEY MANUEVER LEFT   Number of Reps  2    Overall Response  Improved Symptoms              Balance Exercises - 07/02/20 0001      Balance Exercises: Standing   Standing Eyes Closed Narrow base of support (BOS);3 reps    Tandem Stance Eyes open;5 reps    SLS Eyes open    Marching 10 reps    Heel Raises Both;10 reps             PT Education - 07/02/20 1355    Education Details answered questions about BPPV, what happens to the crystals and why it affects your balance    Person(s) Educated Patient    Methods  Explanation    Comprehension Verbalized understanding;Returned demonstration            PT Short Term Goals - 07/02/20 1348      PT SHORT TERM GOAL #1   Title Pt to only be having 2 or less bouts of dizziness a day    Time 2    Period Weeks    Status Achieved    Target Date 07/06/20      PT SHORT TERM GOAL #2   Title PT to be able to single leg stance on both LE for at least 15 seconds to decrease risk of falling    Time 2    Period Weeks    Status Partially Met             PT Long Term Goals - 07/02/20 1349      PT LONG TERM GOAL #1   Title PT to no longer be having any bouts of dizziness    Time 4    Period Weeks    Status On-going      PT LONG TERM GOAL #2   Title Pt to be able to single leg stance for at least 25 seconds on both LE to decrease risk of falling    Time 4    Period Weeks    Status On-going                 Plan - 07/02/20 1404    Clinical Impression Statement Pt is progressing well with goals.  Noted  (+) dix-halpike manuever to the Rt, (-) LT  with eply manuever  successfully completed.  Began working on balance activities.    Personal Factors and Comorbidities Past/Current Experience;Time since onset of injury/illness/exacerbation    Examination-Activity Limitations Carry;Lift    Examination-Participation Restrictions Laundry;Yard Work;Cleaning;Meal Prep    Stability/Clinical Decision Making Evolving/Moderate complexity    Rehab Potential Good    PT Frequency 3x / week    PT Duration 4 weeks    PT Treatment/Interventions Therapeutic activities;Balance training;Therapeutic exercise;Patient/family education;Other (comment)   cannalith repositioning   PT Next Visit Plan Complete Dix-halpike manuever if both are (-) may D/C to Hep    PT Home Exercise Plan eye gaze and VOR 1; 10/27: eply manuever for home, posture and scapular retraction. 06/25/20: tandem stance at sink 06/30/20: standing head turns and nods at counter, single leg stand at counter    Consulted and Agree with Plan of Care Patient           Patient will benefit from skilled therapeutic intervention in order to improve the following deficits and impairments:  Decreased balance, Dizziness  Visit Diagnosis: BPPV (benign paroxysmal positional vertigo), bilateral     Problem List Patient Active Problem List   Diagnosis Date Noted  . Vaginal vault prolapse 10/10/2019  . History of colonic polyps 05/29/2018  . CKD (chronic kidney disease), stage III ([place]) 07/07/2017  . History of brain shunt 12/08/2016  . Osteoporosis 12/08/2016  . Hypertriglyceridemia 04/18/2016  . HTN (hypertension) 03/21/2016  . Hyperlipidemia 03/21/2016  . Presence of pessary 10/14/2015    [name], PT CLT [phone] 07/02/2020, 2:08 PM  [place], [name], [address] Phone: [phone]   Fax:  [phone]  Name: Erin Khan MRN: [id number] Date of Birth: 10/21/1940

## 2020-07-06 ENCOUNTER — Other Ambulatory Visit: Payer: Self-pay

## 2020-07-06 ENCOUNTER — Ambulatory Visit (HOSPITAL_COMMUNITY): Payer: Medicare Other | Admitting: Physical Therapy

## 2020-07-06 DIAGNOSIS — H8113 Benign paroxysmal vertigo, bilateral: Secondary | ICD-10-CM | POA: Diagnosis not present

## 2020-07-06 DIAGNOSIS — Z9181 History of falling: Secondary | ICD-10-CM | POA: Diagnosis not present

## 2020-07-06 NOTE — Therapy (Signed)
[place], [name], [address] Phone: [phone]   Fax:  [phone]  Physical Therapy Treatment  Patient Details  Name: Erin Khan MRN: [id number] Date of Birth: [birth year] Referring Provider (PT): Leta Baptist   Encounter Date: 07/06/2020   PT End of Session - 07/06/20 0915    Visit Number 6    Number of Visits 12    Date for PT Re-Evaluation 07/22/20    Authorization Type [employer]HC mdicare    Progress Note Due on Visit 10    PT Start Time 0830    PT Stop Time 0912    PT Time Calculation (min) 42 min    Activity Tolerance Patient tolerated treatment well    Behavior During Therapy [facility] for tasks assessed/performed           Past Medical History:  Diagnosis Date  . Cataract   . Colon polyps   . Hypercholesteremia   . Hypertension     Past Surgical History:  Procedure Laterality Date  . ABDOMINAL HYSTERECTOMY    . BILATERAL OOPHORECTOMY    . CATARACT EXTRACTION, BILATERAL  2014  . CHOLECYSTECTOMY  1980  . COLONOSCOPY    . COLONOSCOPY N/A 05/15/2013   Procedure: COLONOSCOPY;  Surgeon: Rogene Houston, MD;  [facility];  Service: Endoscopy;  Laterality: N/A;  930  . COLONOSCOPY N/A 10/18/2018   Procedure: COLONOSCOPY;  Surgeon: Rogene Houston, MD;  [facility];  Service: Endoscopy;  Laterality: N/A;  830  . cranial shunt     17 years ago  . POLYPECTOMY  10/18/2018   Procedure: POLYPECTOMY;  Surgeon: Rogene Houston, MD;  [facility];  Service: Endoscopy;;  colon    There were no vitals filed for this visit.   Subjective Assessment - 07/06/20 0830    Subjective Pt states that she has not had any episodes of dizziness since last treatment.  She did fall Saturday but has no idea why she was not lightheaded, dizzy and does not remember losing her balance.    Pertinent History brain shunt checked this year, OA, osteoporosis    Currently in Pain? No/denies                      Balance Exercises - 07/06/20 0001      Balance Exercises: Standing   Standing Eyes Closed Narrow base of support (BOS);3 reps   with head nods    Tandem Stance Eyes open;5 reps   with head turns    SLS Eyes open    SLS with Vectors Solid surface;3 reps;10 secs;Time    Wall Bumps Eyes opened;Eyes closed;10 reps    Tandem Gait Forward;2 reps    Retro Gait 2 reps    Marching 10 reps    Heel Raises Both;10 reps    Other Standing Exercises sit to stand x 10                PT Short Term Goals - 07/06/20 0917      PT SHORT TERM GOAL #1   Title Pt to only be having 2 or less bouts of dizziness a day    Time 2    Period Weeks    Status Achieved    Target Date 07/06/20      PT SHORT TERM GOAL #2   Title PT to be able to single leg stance on both LE for at least  15 seconds to decrease risk of falling    Time 2    Period Weeks    Status Partially Met             PT Long Term Goals - 07/06/20 0917      PT LONG TERM GOAL #1   Title PT to no longer be having any bouts of dizziness    Time 4    Period Weeks    Status Achieved      PT LONG TERM GOAL #2   Title Pt to be able to single leg stance for at least 25 seconds on both LE to decrease risk of falling    Time 4    Period Weeks    Status On-going                 Plan - 07/06/20 0915    Clinical Impression Statement No reports of dizziness for two days therefore dix-halpike manuever was not completed.  This session concentrated on balance with pt tending to lose balance backwards.    Personal Factors and Comorbidities Past/Current Experience;Time since onset of injury/illness/exacerbation    Examination-Activity Limitations Carry;Lift    Examination-Participation Restrictions Laundry;Yard Work;Cleaning;Meal Prep    Stability/Clinical Decision Making Evolving/Moderate complexity    Rehab Potential Good    PT Frequency 3x / week    PT Duration 4 weeks    PT Treatment/Interventions  Therapeutic activities;Balance training;Therapeutic exercise;Patient/family education;Other (comment)   cannalith repositioning   PT Next Visit Plan Complete Dix-halpike manuever if both are (-) may D/C to Hep    PT Home Exercise Plan eye gaze and VOR 1; 10/27: eply manuever for home, posture and scapular retraction. 06/25/20: tandem stance at sink 06/30/20: standing head turns and nods at counter, single leg stand at counter    Consulted and Agree with Plan of Care Patient           Patient will benefit from skilled therapeutic intervention in order to improve the following deficits and impairments:  Decreased balance, Dizziness  Visit Diagnosis: BPPV (benign paroxysmal positional vertigo), bilateral  History of falling     Problem List Patient Active Problem List   Diagnosis Date Noted  . Vaginal vault prolapse 10/10/2019  . History of colonic polyps 05/29/2018  . CKD (chronic kidney disease), stage III ([place]) 07/07/2017  . History of brain shunt 12/08/2016  . Osteoporosis 12/08/2016  . Hypertriglyceridemia 04/18/2016  . HTN (hypertension) 03/21/2016  . Hyperlipidemia 03/21/2016  . Presence of pessary 10/14/2015  [name], PT CLT [phone] 07/06/2020, 9:18 AM  [place], [name], [address] Phone: [phone]   Fax:  [phone]  Name: RAYLEE STREHL MRN: [id number] Date of Birth: 1941-01-04

## 2020-07-07 ENCOUNTER — Ambulatory Visit (HOSPITAL_COMMUNITY): Payer: Medicare Other

## 2020-07-08 ENCOUNTER — Ambulatory Visit (HOSPITAL_COMMUNITY): Payer: Medicare Other | Admitting: Physical Therapy

## 2020-07-08 ENCOUNTER — Telehealth (HOSPITAL_COMMUNITY): Payer: Self-pay | Admitting: Physical Therapy

## 2020-07-08 NOTE — Telephone Encounter (Signed)
pt called to cx this appt due to she is not feeling well 

## 2020-07-09 ENCOUNTER — Telehealth (HOSPITAL_COMMUNITY): Payer: Self-pay | Admitting: Physical Therapy

## 2020-07-09 ENCOUNTER — Ambulatory Visit (HOSPITAL_COMMUNITY): Payer: Medicare Other | Admitting: Physical Therapy

## 2020-07-09 NOTE — Telephone Encounter (Signed)
Therapist called pt who stated that she called and left a message.  She has gotten the booster for Covid19 and has been sick ever since.  She will see [place] next week.  [name], [place] CLT [phone]

## 2020-07-09 NOTE — Telephone Encounter (Signed)
pt called ro cx due to she had her booster and is not feeling well

## 2020-07-13 ENCOUNTER — Encounter (HOSPITAL_COMMUNITY): Payer: Medicare Other | Admitting: Physical Therapy

## 2020-07-15 ENCOUNTER — Other Ambulatory Visit: Payer: Self-pay

## 2020-07-15 ENCOUNTER — Ambulatory Visit (HOSPITAL_COMMUNITY): Payer: Medicare Other | Admitting: Physical Therapy

## 2020-07-15 DIAGNOSIS — H8113 Benign paroxysmal vertigo, bilateral: Secondary | ICD-10-CM | POA: Diagnosis not present

## 2020-07-15 DIAGNOSIS — Z9181 History of falling: Secondary | ICD-10-CM

## 2020-07-15 NOTE — Therapy (Signed)
[name], [name], [address] Phone: [phone]   Fax:  [phone]  Physical Therapy Treatment  Patient Details  Name: Erin Khan MRN: [id number] Date of Birth: [birth year] Referring Provider (Erin): Erin Khan   Encounter Date: 07/15/2020   Erin End of Session - 07/15/20 0858    Visit Number 7    Number of Visits 12    Date for Erin Re-Evaluation 07/22/20    Authorization Type [employer] mdicare    Progress Note Due on Visit 10    Erin Start Time 0830    Activity Tolerance Patient tolerated treatment well    Behavior During Therapy [facility] for tasks assessed/performed           Past Medical History:  Diagnosis Date  . Cataract   . Colon polyps   . Hypercholesteremia   . Hypertension     Past Surgical History:  Procedure Laterality Date  . ABDOMINAL HYSTERECTOMY    . BILATERAL OOPHORECTOMY    . CATARACT EXTRACTION, BILATERAL  2014  . CHOLECYSTECTOMY  1980  . COLONOSCOPY    . COLONOSCOPY N/A 05/15/2013   Procedure: COLONOSCOPY;  Surgeon: Erin Houston, MD;  [facility];  Service: Endoscopy;  Laterality: N/A;  930  . COLONOSCOPY N/A 10/18/2018   Procedure: COLONOSCOPY;  Surgeon: Erin Houston, MD;  [facility];  Service: Endoscopy;  Laterality: N/A;  830  . cranial shunt     17 years ago  . POLYPECTOMY  10/18/2018   Procedure: POLYPECTOMY;  Surgeon: Erin Houston, MD;  [facility];  Service: Endoscopy;;  colon    There were no vitals filed for this visit.   Subjective Assessment - 07/15/20 0832    Subjective Erin states that she has had some dizziness.  The last time she experienced this was Thursday.    Pertinent History brain shunt checked this year, OA, osteoporosis    Currently in Pain? No/denies                   Vestibular Assessment - 07/15/20 0001      Dix-Hallpike Right   Dix-Hallpike Right Symptoms No nystagmus      Dix-Hallpike Left   Dix-Hallpike  Left Symptoms No nystagmus      Positional Sensitivities   Up from Right Hallpike Lightheadedness    Up from Left Hallpike Lightheadedness                     Vestibular Treatment/Exercise - 07/15/20 0001      Vestibular Treatment/Exercise   Habituation Exercises [name] Daroff   Number of Reps  5              Balance Exercises - 07/15/20 0001      Balance Exercises: Standing   Tandem Stance Eyes open;Foam/compliant surface;2 reps    SLS Eyes open;4 reps    Wall Bumps Eyes opened;Eyes closed;10 reps    Tandem Gait Forward;Foam/compliant surface;2 reps    Step Over Hurdles / Cones x 2 reps 4"                Erin Short Term Goals - 07/06/20 0917      Erin SHORT TERM GOAL #1   Title Erin to only be having 2 or less bouts of dizziness a day    Time 2    Period Weeks  Status Achieved    Target Date 07/06/20      Erin SHORT TERM GOAL #2   Title Erin to be able to single leg stance on both LE for at least 15 seconds to decrease risk of falling    Time 2    Period Weeks    Status Partially Met             Erin Long Term Goals - 07/06/20 9449      Erin LONG TERM GOAL #1   Title Erin to no longer be having any bouts of dizziness    Time 4    Period Weeks    Status Achieved      Erin LONG TERM GOAL #2   Title Erin to be able to single leg stance for at least 25 seconds on both LE to decrease risk of falling    Time 4    Period Weeks    Status On-going                 Plan - 07/15/20 0913    Clinical Impression Statement Erin states it has been 6 days since her last episode of dizziness.  Dix Hal-pike (-) B, however Erin reports dizziness coming up from manuever B.  Added sit to sidelying habituation exercises. Erin progressed to foam and hurdles for balance    Personal Factors and Comorbidities Past/Current Experience;Time since onset of injury/illness/exacerbation    Examination-Activity Limitations Carry;Lift     Examination-Participation Restrictions Laundry;Yard Work;Cleaning;Meal Prep    Stability/Clinical Decision Making Evolving/Moderate complexity    Rehab Potential Good    Erin Frequency 3x / week    Erin Duration 4 weeks    Erin Treatment/Interventions Therapeutic activities;Balance training;Therapeutic exercise;Patient/family education;Other (comment)   cannalith repositioning   Erin Next Visit Plan Complete Dix-halpike manuever if both are (-) may D/C to Hep    Erin Home Exercise Plan eye gaze and VOR 1; 10/27: eply manuever for home, posture and scapular retraction. 06/25/20: tandem stance at sink 06/30/20: standing head turns and nods at counter, single leg stand at counter    Consulted and Agree with Plan of Care Patient           Patient will benefit from skilled therapeutic intervention in order to improve the following deficits and impairments:  Decreased balance, Dizziness  Visit Diagnosis: History of falling     Problem List Patient Active Problem List   Diagnosis Date Noted  . Vaginal vault prolapse 10/10/2019  . History of colonic polyps 05/29/2018  . CKD (chronic kidney disease), stage III ([place]) 07/07/2017  . History of brain shunt 12/08/2016  . Osteoporosis 12/08/2016  . Hypertriglyceridemia 04/18/2016  . HTN (hypertension) 03/21/2016  . Hyperlipidemia 03/21/2016  . Presence of pessary 10/14/2015    Erin Khan, Erin Khan [phone] 07/15/2020, 9:15 AM  [place], [name], [address] Phone: [phone]   Fax:  [phone]  Name: Erin Khan MRN: [id number] Date of Birth: 01/14/1941

## 2020-07-15 NOTE — Patient Instructions (Addendum)
Benign Positional Vertigo Vertigo is the feeling that you or your surroundings are moving when they are not. Benign positional vertigo is the most common form of vertigo. This is usually a harmless condition (benign). This condition is positional. This means that symptoms are triggered by certain movements and positions. This condition can be dangerous if it occurs while you are doing something that could cause harm to you or others. This includes activities such as driving or operating machinery. What are the causes? In many cases, the cause of this condition is not known. It may be caused by a disturbance in an area of the inner ear that helps your brain to sense movement and balance. This disturbance can be caused by:  Viral infection (labyrinthitis).  Head injury.  Repetitive motion, such as jumping, dancing, or running. What increases the risk? You are more likely to develop this condition if:  You are a woman.  You are 50 years of age or older. What are the signs or symptoms? Symptoms of this condition usually happen when you move your head or your eyes in different directions. Symptoms may start suddenly, and usually last for less than a minute. They include:  Loss of balance and falling.  Feeling like you are spinning or moving.  Feeling like your surroundings are spinning or moving.  Nausea and vomiting.  Blurred vision.  Dizziness.  Involuntary eye movement (nystagmus). Symptoms can be mild and cause only minor problems, or they can be severe and interfere with daily life. Episodes of benign positional vertigo may return (recur) over time. Symptoms may improve over time. How is this diagnosed? This condition may be diagnosed based on:  Your medical history.  Physical exam of the head, neck, and ears.  Tests, such as: ? MRI. ? CT scan. ? Eye movement tests. Your health care provider may ask you to change positions quickly while he or she watches you for symptoms  of benign positional vertigo, such as nystagmus. Eye movement may be tested with a variety of exams that are designed to evaluate or stimulate vertigo. ? An electroencephalogram (EEG). This records electrical activity in your brain. ? Hearing tests. You may be referred to a health care provider who specializes in ear, nose, and throat (ENT) problems (otolaryngologist) or a provider who specializes in disorders of the nervous system (neurologist). How is this treated?  This condition may be treated in a session in which your health care provider moves your head in specific positions to adjust your inner ear back to normal. Treatment for this condition may take several sessions. Surgery may be needed in severe cases, but this is rare. In some cases, benign positional vertigo may resolve on its own in 2-4 weeks. Follow these instructions at home: Safety  Move slowly. Avoid sudden body or head movements or certain positions, as told by your health care provider.  Avoid driving until your health care provider says it is safe for you to do so.  Avoid operating heavy machinery until your health care provider says it is safe for you to do so.  Avoid doing any tasks that would be dangerous to you or others if vertigo occurs.  If you have trouble walking or keeping your balance, try using a cane for stability. If you feel dizzy or unstable, sit down right away.  Return to your normal activities as told by your health care provider. Ask your health care provider what activities are safe for you. General instructions  Take over-the-counter   and prescription medicines only as told by your health care provider.  Drink enough fluid to keep your urine pale yellow.  Keep all follow-up visits as told by your health care provider. This is important. Contact a health care provider if:  You have a fever.  Your condition gets worse or you develop new symptoms.  Your family or friends notice any  behavioral changes.  You have nausea or vomiting that gets worse.  You have numbness or a "pins and needles" sensation. Get help right away if you:  Have difficulty speaking or moving.  Are always dizzy.  Faint.  Develop severe headaches.  Have weakness in your legs or arms.  Have changes in your hearing or vision.  Develop a stiff neck.  Develop sensitivity to light. Summary  Vertigo is the feeling that you or your surroundings are moving when they are not. Benign positional vertigo is the most common form of vertigo.  The cause of this condition is not known. It may be caused by a disturbance in an area of the inner ear that helps your brain to sense movement and balance.  Symptoms include loss of balance and falling, feeling that you or your surroundings are moving, nausea and vomiting, and blurred vision.  This condition can be diagnosed based on symptoms, physical exam, and other tests, such as MRI, CT scan, eye movement tests, and hearing tests.  Follow safety instructions as told by your health care provider. You will also be told when to contact your health care provider in case of problems. This information is not intended to replace advice given to you by your health care provider. Make sure you discuss any questions you have with your health care provider. Document Revised: 01/24/2018 Document Reviewed: 01/24/2018 Elsevier Patient Education  [place] to Side-Lying    Sit on edge of bed. 1. Turn head 45 to right. 2. Maintain head position and lie down slowly on left side. Hold until symptoms subside. 3. Sit up slowly. Hold until symptoms subside. 4. Turn head 45 to left. 5. Maintain head position and lie down slowly on right side. Hold until symptoms subside. 6. Sit up slowly. Repeat sequence __5__ times per session. Do __1__ sessions per day.  Copyright  VHI. All rights reserved.  Gaze Stabilization: Standing Feet Heel-Toe "Tandem"    Feet  in full heel-toe position, keeping eyes on target on wall _5_ feet away, tilt head down 15-30 and move head side to side for _10___ seconds. Repeat while moving head up and down for __10__ seconds. Do ___2_ sessions per day. Repeat using target on pattern background.  Copyright  VHI. All rights reserved.

## 2020-07-17 ENCOUNTER — Ambulatory Visit (HOSPITAL_COMMUNITY): Payer: Medicare Other | Admitting: Physical Therapy

## 2020-07-17 ENCOUNTER — Other Ambulatory Visit: Payer: Self-pay

## 2020-07-17 ENCOUNTER — Encounter (HOSPITAL_COMMUNITY): Payer: Self-pay | Admitting: Physical Therapy

## 2020-07-17 DIAGNOSIS — H8113 Benign paroxysmal vertigo, bilateral: Secondary | ICD-10-CM | POA: Diagnosis not present

## 2020-07-17 DIAGNOSIS — Z9181 History of falling: Secondary | ICD-10-CM

## 2020-07-17 NOTE — Therapy (Signed)
[place], [name], [address] Phone: [phone]   Fax:  [phone]  Physical Therapy Treatment  Patient Details  Name: Erin Khan MRN: [id number] Date of Birth: 1941/03/31 Referring Provider (PT): Leta Baptist   Encounter Date: 07/17/2020   PT End of Session - 07/17/20 0952    Visit Number 8    Number of Visits 12    Date for PT Re-Evaluation 07/22/20    Authorization Type [employer] mdicare    Progress Note Due on Visit 10    PT Start Time 0920    PT Stop Time 0947    PT Time Calculation (min) 27 min    Activity Tolerance Patient tolerated treatment well    Behavior During Therapy [facility] for tasks assessed/performed           Past Medical History:  Diagnosis Date  . Cataract   . Colon polyps   . Hypercholesteremia   . Hypertension     Past Surgical History:  Procedure Laterality Date  . ABDOMINAL HYSTERECTOMY    . BILATERAL OOPHORECTOMY    . CATARACT EXTRACTION, BILATERAL  2014  . CHOLECYSTECTOMY  1980  . COLONOSCOPY    . COLONOSCOPY N/A 05/15/2013   Procedure: COLONOSCOPY;  Surgeon: Rogene Houston, MD;  [facility];  Service: Endoscopy;  Laterality: N/A;  930  . COLONOSCOPY N/A 10/18/2018   Procedure: COLONOSCOPY;  Surgeon: Rogene Houston, MD;  [facility];  Service: Endoscopy;  Laterality: N/A;  830  . cranial shunt     17 years ago  . POLYPECTOMY  10/18/2018   Procedure: POLYPECTOMY;  Surgeon: Rogene Houston, MD;  [facility];  Service: Endoscopy;;  colon    There were no vitals filed for this visit.   Subjective Assessment - 07/17/20 0920    Subjective Pt states that she had a bad day yesterday.    Currently in Pain? No/denies                   Vestibular Assessment - 07/17/20 0001      Positional Testing   Dix-Hallpike Dix-Hallpike Right;Dix-Hallpike Left      Dix-Hallpike Right   Dix-Hallpike Right Duration 10 seconds     Dix-Hallpike Right  Symptoms Upbeat, right rotatory nystagmus      Dix-Hallpike Left   Dix-Hallpike Left Symptoms No nystagmus                    [facility] Adult PT Treatment/Exercise - 07/17/20 0001      Manual Therapy   Manual Therapy Soft tissue mobilization    Manual therapy comments completed seperate from all other aspects of treatment.    Soft tissue mobilization B upper trap to decrease spasm           Vestibular Treatment/Exercise - 07/17/20 0001      Vestibular Treatment/Exercise   Vestibular Treatment Provided Canalith Repositioning    Canalith Repositioning Epley Manuever Right       EPLEY MANUEVER RIGHT   Number of Reps  3    Overall Response Improved Symptoms      X1 Viewing Horizontal   Reps 5                 PT Education - 07/17/20 0951    Education Details reveiwed self correction techniques and urged pt to complete if she has any dizziness over the weekend    Person(s)  Educated Patient    Methods Explanation            PT Short Term Goals - 07/06/20 0917      PT SHORT TERM GOAL #1   Title Pt to only be having 2 or less bouts of dizziness a day    Time 2    Period Weeks    Status Achieved    Target Date 07/06/20      PT SHORT TERM GOAL #2   Title PT to be able to single leg stance on both LE for at least 15 seconds to decrease risk of falling    Time 2    Period Weeks    Status Partially Met             PT Long Term Goals - 07/06/20 6333      PT LONG TERM GOAL #1   Title PT to no longer be having any bouts of dizziness    Time 4    Period Weeks    Status Achieved      PT LONG TERM GOAL #2   Title Pt to be able to single leg stance for at least 25 seconds on both LE to decrease risk of falling    Time 4    Period Weeks    Status On-going                 Plan - 07/17/20 0953    Clinical Impression Statement Pt with a + Dix-halpike manuever; completed eply's 3 times prior to being asymptomatic.  Following manuver pt able to  rotate cervical spine without sx; Manual completed to decrease significant tightness in mm.    Personal Factors and Comorbidities Past/Current Experience;Time since onset of injury/illness/exacerbation    Examination-Activity Limitations Carry;Lift    Examination-Participation Restrictions Laundry;Yard Work;Cleaning;Meal Prep    Stability/Clinical Decision Making Evolving/Moderate complexity    Rehab Potential Good    PT Frequency 3x / week    PT Duration 4 weeks    PT Treatment/Interventions Therapeutic activities;Balance training;Therapeutic exercise;Patient/family education;Other (comment)   cannalith repositioning   PT Next Visit Plan Pt unable to come next week as there are no openings; reassess as cert will have expired    PT Home Exercise Plan eye gaze and VOR 1; 10/27: eply manuever for home, posture and scapular retraction. 06/25/20: tandem stance at sink 06/30/20: standing head turns and nods at counter, single leg stand at counter    Consulted and Agree with Plan of Care Patient           Patient will benefit from skilled therapeutic intervention in order to improve the following deficits and impairments:  Decreased balance, Dizziness  Visit Diagnosis: History of falling  BPPV (benign paroxysmal positional vertigo), bilateral     Problem List Patient Active Problem List   Diagnosis Date Noted  . Vaginal vault prolapse 10/10/2019  . History of colonic polyps 05/29/2018  . CKD (chronic kidney disease), stage III ([place]) 07/07/2017  . History of brain shunt 12/08/2016  . Osteoporosis 12/08/2016  . Hypertriglyceridemia 04/18/2016  . HTN (hypertension) 03/21/2016  . Hyperlipidemia 03/21/2016  . Presence of pessary 10/14/2015    [name], PT CLT [phone] 07/17/2020, 9:56 AM  [place] [address] [place], [name], [address] Phone: [phone]   Fax:  [phone]  Name: Erin Khan MRN: [id number] Date of  Birth: April 19, 1941

## 2020-07-28 ENCOUNTER — Other Ambulatory Visit: Payer: Self-pay

## 2020-07-28 ENCOUNTER — Ambulatory Visit (HOSPITAL_COMMUNITY): Payer: Medicare Other | Admitting: Physical Therapy

## 2020-07-28 DIAGNOSIS — Z9181 History of falling: Secondary | ICD-10-CM | POA: Diagnosis not present

## 2020-07-28 DIAGNOSIS — H8113 Benign paroxysmal vertigo, bilateral: Secondary | ICD-10-CM

## 2020-07-28 NOTE — Therapy (Signed)
[place], [name], [address] Phone: [phone]   Fax:  [phone]  Physical Therapy Treatment  Patient Details  Name: Erin Khan MRN: [id number] Date of Birth: Apr 15, 1941 Referring Provider (PT): Leta Baptist   Encounter Date: 07/28/2020   PHYSICAL THERAPY DISCHARGE SUMMARY  Visits from Start of Care: 9  Current functional level related to goals / functional outcomes: No dizziness   Remaining deficits: none   Education / Equipment: What to do if you get dizzy to include Eply and Dix hal-pike maneuver  Plan: Patient agrees to discharge.  Patient goals were met. Patient is being discharged due to meeting the stated rehab goals.  ?????       PT End of Session - 07/28/20 1044    Visit Number 9    Number of Visits 9    Date for PT Re-Evaluation 07/22/20    Authorization Type [employer] mdicare    Authorization - Number of Visits 9    Progress Note Due on Visit 9    PT Start Time 1000    PT Stop Time 1040    PT Time Calculation (min) 40 min    Activity Tolerance Patient tolerated treatment well    Behavior During Therapy WFL for tasks assessed/performed           Past Medical History:  Diagnosis Date  . Cataract   . Colon polyps   . Hypercholesteremia   . Hypertension     Past Surgical History:  Procedure Laterality Date  . ABDOMINAL HYSTERECTOMY    . BILATERAL OOPHORECTOMY    . CATARACT EXTRACTION, BILATERAL  2014  . CHOLECYSTECTOMY  1980  . COLONOSCOPY    . COLONOSCOPY N/A 05/15/2013   Procedure: COLONOSCOPY;  Surgeon: Rogene Houston, MD;  [facility];  Service: Endoscopy;  Laterality: N/A;  930  . COLONOSCOPY N/A 10/18/2018   Procedure: COLONOSCOPY;  Surgeon: Rogene Houston, MD;  [facility];  Service: Endoscopy;  Laterality: N/A;  830  . cranial shunt     17 years ago  . POLYPECTOMY  10/18/2018   Procedure: POLYPECTOMY;  Surgeon: Rogene Houston, MD;  [facility];  Service: Endoscopy;;  colon    There were no vitals filed for this visit.   Subjective Assessment - 07/28/20 1007    Subjective PT states that she has not been dizzy since the last time she was here.    Pertinent History brain shunt checked this year, OA, osteoporosis    Currently in Pain? No/denies              [facility] PT Assessment - 07/28/20 0001      Assessment   Medical Diagnosis BPPV     Referring Provider (PT) Su Teoh    Onset Date/Surgical Date 04/12/20    Prior Therapy Rt Epley maneuver in MD office in August and September       Precautions   Precautions Fall      Restrictions   Weight Bearing Restrictions No      Home Environment   Living Environment Private residence      Prior Function   Level of Independence Independent      Cognition   Overall Cognitive Status Within Functional Limits for tasks assessed      Functional Tests   Functional tests Single leg stance      Single Leg Stance   Comments Lt 40 was 4  seconds Rt 31 was 0                      PT Education - 07/28/20 1042    Education Details Therapist extenesively reviewed what the pt is to do if she experiences dizziness explaining how the Epley determines which ear and then going over the [employer].    Person(s) Educated Patient    Methods Explanation;Handout;Demonstration;Verbal cues    Comprehension Verbalized understanding            PT Short Term Goals - 07/28/20 1012      PT SHORT TERM GOAL #1   Title Pt to only be having 2 or less bouts of dizziness a day    Time 2    Period Weeks    Status Achieved    Target Date 07/06/20      PT SHORT TERM GOAL #2   Title PT to be able to single leg stance on both LE for at least 15 seconds to decrease risk of falling    Time 2    Period Weeks    Status Achieved             PT Long Term Goals - 07/28/20 1012      PT LONG TERM GOAL #1   Title PT to no longer be having any bouts of dizziness     Time 4    Period Weeks    Status Achieved      PT LONG TERM GOAL #2   Title Pt to be able to single leg stance for at least 25 seconds on both LE to decrease risk of falling    Time 4    Period Weeks    Status Achieved                 Plan - 07/28/20 1045    Clinical Impression Statement PT has not had any sx since 11/29.  She feels comfortable with discharge and self care instructions.    Personal Factors and Comorbidities Past/Current Experience;Time since onset of injury/illness/exacerbation    Examination-Activity Limitations Carry;Lift    Examination-Participation Restrictions Laundry;Yard Work;Cleaning;Meal Prep    Stability/Clinical Decision Making Evolving/Moderate complexity    Rehab Potential Good    PT Frequency 3x / week    PT Duration 4 weeks    PT Treatment/Interventions Therapeutic activities;Balance training;Therapeutic exercise;Patient/family education;Other (comment)   cannalith repositioning   PT Next Visit Plan Discharge.    PT Home Exercise Plan eye gaze and VOR 1; 10/27: eply manuever for home, posture and scapular retraction. 06/25/20: tandem stance at sink 06/30/20: standing head turns and nods at counter, single leg stand at counter    Consulted and Agree with Plan of Care Patient           Patient will benefit from skilled therapeutic intervention in order to improve the following deficits and impairments:  Decreased balance, Dizziness  Visit Diagnosis: History of falling - Plan: PT plan of care cert/re-cert  BPPV (benign paroxysmal positional vertigo), bilateral - Plan: PT plan of care cert/re-cert     Problem List Patient Active Problem List   Diagnosis Date Noted  . Vaginal vault prolapse 10/10/2019  . History of colonic polyps 05/29/2018  . CKD (chronic kidney disease), stage III ([place]) 07/07/2017  . History of brain shunt 12/08/2016  . Osteoporosis 12/08/2016  . Hypertriglyceridemia 04/18/2016  . HTN (hypertension) 03/21/2016  .  Hyperlipidemia 03/21/2016  . Presence of pessary  10/14/2015   [name], PT CLT [phone] 07/28/2020, 10:48 AM  [place] [address] [place], [name], [address] Phone: [phone]   Fax:  [phone]  Name: ELON LOMELI MRN: [id number] Date of Birth: 1941-08-03

## 2020-07-30 ENCOUNTER — Encounter (HOSPITAL_COMMUNITY): Payer: Medicare Other

## 2020-08-04 ENCOUNTER — Ambulatory Visit (HOSPITAL_COMMUNITY): Payer: Medicare Other | Admitting: Physical Therapy

## 2020-08-06 ENCOUNTER — Ambulatory Visit (HOSPITAL_COMMUNITY): Payer: Medicare Other | Admitting: Physical Therapy

## 2020-08-12 ENCOUNTER — Encounter (HOSPITAL_COMMUNITY): Payer: Medicare Other

## 2020-08-24 ENCOUNTER — Encounter: Payer: Self-pay | Admitting: Family Medicine

## 2020-08-24 ENCOUNTER — Ambulatory Visit (INDEPENDENT_AMBULATORY_CARE_PROVIDER_SITE_OTHER): Payer: Medicare Other | Admitting: Family Medicine

## 2020-08-24 DIAGNOSIS — J069 Acute upper respiratory infection, unspecified: Secondary | ICD-10-CM | POA: Diagnosis not present

## 2020-08-24 NOTE — Progress Notes (Signed)
    Subjective:    Patient ID: Erin Khan, female    DOB: [birth year], 79 y.o.   MRN: [id number]   HPI: Erin Khan is a 79 y.o. female presenting for little bit of cough. Tight in chest and body aches. Denies chest pain. She is dyspneic, but it is not limiting. Onset on arising this morning.   Runny nose and scratchy throat as well. Subjective fever this AM. No change in taste or smell.  Depression screen [facility] 2/9 05/19/2020 04/10/2020 05/21/2019 05/30/2018 05/24/2018  Decreased Interest 0 0 0 0 0  Down, Depressed, Hopeless 0 0 0 0 0  PHQ - 2 Score 0 0 0 0 0     Relevant past medical, surgical, family and social history reviewed and updated as indicated.  Interim medical history since our last visit reviewed. Allergies and medications reviewed and updated.  ROS:  Review of Systems  Constitutional: Negative for appetite change, chills, diaphoresis, fatigue and fever.  HENT: Positive for congestion, rhinorrhea (clear) and sore throat (scratchy, not painful). Negative for ear pain, hearing loss, postnasal drip and trouble swallowing.   Respiratory: Positive for cough, chest tightness and shortness of breath (mild. Not limiting).   Cardiovascular: Negative for chest pain.  Gastrointestinal: Negative for abdominal pain, nausea and vomiting.  Musculoskeletal: Positive for myalgias.  Skin: Negative for rash.     Social History   Tobacco Use  Smoking Status Never Smoker  Smokeless Tobacco Never Used       Objective:     Wt Readings from Last 3 Encounters:  06/09/20 156 lb (70.8 kg)  05/19/20 157 lb 3.2 oz (71.3 kg)  04/10/20 154 lb (69.9 kg)     Exam deferred. Pt. Harboring due to COVID 19. Phone visit performed.   Assessment & Plan:  No diagnosis found.  No orders of the defined types were placed in this encounter.   No orders of the defined types were placed in this encounter.     There are no diagnoses linked to this encounter.  Virtual Visit via telephone  Note  I discussed the limitations, risks, security and privacy concerns of performing an evaluation and management service by telephone and the availability of in person appointments. The patient was identified with two identifiers. Pt.expressed understanding and agreed to proceed. Pt. Is at home. Dr. Darlyn Read is in his office.  Follow Up Instructions:   I discussed the assessment and treatment plan with the patient. The patient was provided an opportunity to ask questions and all were answered. The patient agreed with the plan and demonstrated an understanding of the instructions.   The patient was advised to call back or seek an in-person evaluation if the symptoms worsen or if the condition fails to improve as anticipated.   Total minutes including chart review and phone contact time: 13   Follow up plan: No follow-ups on file.  Mechele Claude, MD [facility]

## 2020-08-26 ENCOUNTER — Telehealth: Payer: Self-pay

## 2020-08-26 LAB — NOVEL CORONAVIRUS, NAA

## 2020-08-26 NOTE — Telephone Encounter (Signed)
Notified patient that results are not in yet.

## 2020-08-27 ENCOUNTER — Telehealth: Payer: Self-pay

## 2020-08-27 ENCOUNTER — Other Ambulatory Visit: Payer: Medicare Other

## 2020-08-27 ENCOUNTER — Other Ambulatory Visit: Payer: Self-pay | Admitting: Family Medicine

## 2020-08-27 DIAGNOSIS — J069 Acute upper respiratory infection, unspecified: Secondary | ICD-10-CM | POA: Diagnosis not present

## 2020-08-27 NOTE — Telephone Encounter (Signed)
Patient aware test did not take patient aware to come and get covid test today order put in verbal from Dr. Darlyn Read.

## 2020-08-29 LAB — SARS-COV-2, NAA 2 DAY TAT

## 2020-08-29 LAB — NOVEL CORONAVIRUS, NAA: SARS-CoV-2, NAA: NOT DETECTED

## 2020-08-30 NOTE — Progress Notes (Signed)
Hello Erin Khan,  Your lab result is normal and/or stable.Some minor variations that are not significant are commonly marked abnormal, but do not represent any medical problem for you.  Best regards, Mechele Claude, M.D.

## 2020-09-01 NOTE — Telephone Encounter (Signed)
Pt aware COVID not detected on 08/27/20.

## 2020-09-14 DIAGNOSIS — J0100 Acute maxillary sinusitis, unspecified: Secondary | ICD-10-CM | POA: Diagnosis not present

## 2020-09-14 DIAGNOSIS — R059 Cough, unspecified: Secondary | ICD-10-CM | POA: Diagnosis not present

## 2020-09-23 ENCOUNTER — Telehealth: Payer: Self-pay

## 2020-09-23 NOTE — Telephone Encounter (Signed)
Pt scheduled to be seen in our Respiratory Clinic tomorrow with JE at 4:00.

## 2020-09-24 ENCOUNTER — Ambulatory Visit (INDEPENDENT_AMBULATORY_CARE_PROVIDER_SITE_OTHER): Payer: Medicare Other | Admitting: Nurse Practitioner

## 2020-09-24 ENCOUNTER — Encounter: Payer: Self-pay | Admitting: Nurse Practitioner

## 2020-09-24 VITALS — BP 138/74 | HR 85 | Temp 96.8°F

## 2020-09-24 DIAGNOSIS — R531 Weakness: Secondary | ICD-10-CM | POA: Insufficient documentation

## 2020-09-24 NOTE — Assessment & Plan Note (Signed)
Patient is reporting unresolved fatigue and generalized weakness since tested positive for COVID-19 on January 17/2022.  Patient has no fever, congestion, nausea or vomiting.  Provided reassurance to patient and education to emphasize the fact that Covid symptoms can take a while to completely go away. Advised patient to treat headache with Tylenol, continue to monitor oxygen saturations. follow-up with worsening or unresolved symptoms.

## 2020-09-24 NOTE — Patient Instructions (Signed)

## 2020-09-24 NOTE — Progress Notes (Signed)
Acute Office Visit  Subjective:    Patient ID: Erin Khan, female    DOB: July 24, 1941, 80 y.o.   MRN: [id number]  Chief Complaint  Patient presents with  . Fatigue  . Weakness    Since she had covid on 1/17- tested positive     HPI Patient is a 80 year old female who presents to clinic with fatigue and generalized weakness since 09/14/2020.  Patient tested positive for Covid 19 on January 17.  Symptoms have since alleviated but patient is reporting worsening fatigue and weakness.  Patient denies fever, headache, myalgias and shortness of breath.  Patient has used nothing for symptoms but continues to monitor oxygen saturation which consistently stays above 95%.  Past Medical History:  Diagnosis Date  . Cataract   . Colon polyps   . Hypercholesteremia   . Hypertension     Past Surgical History:  Procedure Laterality Date  . ABDOMINAL HYSTERECTOMY    . BILATERAL OOPHORECTOMY    . CATARACT EXTRACTION, BILATERAL  2014  . CHOLECYSTECTOMY  1980  . COLONOSCOPY    . COLONOSCOPY N/A 05/15/2013   Procedure: COLONOSCOPY;  Surgeon: Rogene Houston, MD;  [facility];  Service: Endoscopy;  Laterality: N/A;  930  . COLONOSCOPY N/A 10/18/2018   Procedure: COLONOSCOPY;  Surgeon: Rogene Houston, MD;  Location: [facility];  Service: Endoscopy;  Laterality: N/A;  830  . cranial shunt     17 years ago  . POLYPECTOMY  10/18/2018   Procedure: POLYPECTOMY;  Surgeon: Rogene Houston, MD;  Location: [facility];  Service: Endoscopy;;  colon    Family History  Problem Relation Age of Onset  . Lung cancer Sister   . Hypertension Father   . Heart attack Brother   . Arthritis Sister   . Asthma Brother   . Healthy Daughter   . Healthy Son   . Colon cancer Neg Hx     Social History   Socioeconomic History  . Marital status: Married    Spouse name: Erin Khan  . Number of children: 2  . Years of education: 45  . Highest education level: 12th grade  Occupational History  .  Occupation: Retired    Comment: Environmental health practitioner  . Occupation: [employer]    Employer: [employer]    Comment: 20 to 25 hours a week part time  Tobacco Use  . Smoking status: Never Smoker  . Smokeless tobacco: Never Used  Vaping Use  . Vaping Use: Never used  Substance and Sexual Activity  . Alcohol use: No  . Drug use: No  . Sexual activity: Yes  Other Topics Concern  . Not on file  Social History Narrative   She lives with her husband, Erin Khan.   She  has eight grandchildren.   Social Determinants of Health   Financial Resource Strain: Low Risk   . Difficulty of Paying Living Expenses: Not hard at all  Food Insecurity: No Food Insecurity  . Worried About Charity fundraiser in the Last Year: Never true  . Ran Out of Food in the Last Year: Never true  Transportation Needs: No Transportation Needs  . Lack of Transportation (Medical): No  . Lack of Transportation (Non-Medical): No  Physical Activity: Sufficiently Active  . Days of Exercise per Week: 3 days  . Minutes of Exercise per Session: 60 min  Stress: No Stress Concern Present  . Feeling of Stress : Not at all  Social Connections: Socially Integrated  .  Frequency of Communication with Friends and Family: More than three times a week  . Frequency of Social Gatherings with Friends and Family: More than three times a week  . Attends Religious Services: More than 4 times per year  . Active Member of Clubs or Organizations: Yes  . Attends Banker Meetings: More than 4 times per year  . Marital Status: Married  Catering manager Violence: Not At Risk  . Fear of Current or Ex-Partner: No  . Emotionally Abused: No  . Physically Abused: No  . Sexually Abused: No    Outpatient Medications Prior to Visit  Medication Sig Dispense Refill  . BLACK ELDERBERRY PO Take by mouth.    . Calcium Carbonate-Vitamin D (CALCIUM 600+D PO) Take 1 tablet by mouth daily.    . Ergocalciferol (VITAMIN D2) 10 MCG (400 UNIT) TABS Take  400 Units by mouth daily.    Marland Kitchen ezetimibe (ZETIA) 10 MG tablet Take 1 tablet (10 mg total) by mouth daily. 90 tablet 3  . lisinopril (ZESTRIL) 20 MG tablet Take 1 tablet (20 mg total) by mouth daily. 90 tablet 3  . vitamin B-12 (CYANOCOBALAMIN) 500 MCG tablet Take 500 mcg by mouth daily.      No facility-administered medications prior to visit.    Allergies  Allergen Reactions  . Asa [Aspirin] Hives and Swelling  . Statins     "made me feel funny"    Review of Systems  Constitutional: Negative for fever.  HENT: Negative for sinus pressure, sinus pain and sneezing.   Respiratory: Negative for chest tightness and shortness of breath.   Cardiovascular: Negative.  Negative for palpitations.  Genitourinary: Negative.   Musculoskeletal: Positive for myalgias.  Skin: Negative.   Neurological: Positive for weakness.  Psychiatric/Behavioral: The patient is not nervous/anxious.   All other systems reviewed and are negative.      Objective:    Physical Exam Vitals reviewed.  HENT:     Head: Normocephalic.     Nose: Nose normal. No congestion.     Mouth/Throat:     Mouth: Mucous membranes are moist.     Pharynx: Oropharynx is clear.  Eyes:     Conjunctiva/sclera: Conjunctivae normal.  Cardiovascular:     Rate and Rhythm: Normal rate and regular rhythm.     Pulses: Normal pulses.     Heart sounds: Normal heart sounds.  Pulmonary:     Effort: Pulmonary effort is normal.     Breath sounds: Normal breath sounds.  Abdominal:     General: Bowel sounds are normal.  Musculoskeletal:        General: No tenderness.  Skin:    General: Skin is warm.     Coloration: Skin is not pale.  Neurological:     Mental Status: She is alert and oriented to person, place, and time.     Motor: Weakness present.  Psychiatric:        Behavior: Behavior normal.     BP 138/74   Pulse 85   Temp (!) 96.8 F (36 C) (Temporal)   SpO2 96%  Wt Readings from Last 3 Encounters:  06/09/20 156 lb  (70.8 kg)  05/19/20 157 lb 3.2 oz (71.3 kg)  04/10/20 154 lb (69.9 kg)    There are no preventive care reminders to display for this patient.  There are no preventive care reminders to display for this patient.   Lab Results  Component Value Date   TSH 0.357 (L) 04/10/2020   Lab  Results  Component Value Date   WBC 7.5 04/10/2020   HGB 13.0 04/10/2020   HCT 38.0 04/10/2020   MCV 92 04/10/2020   PLT 208 04/10/2020   Lab Results  Component Value Date   NA 144 06/09/2020   K 4.4 06/09/2020   CO2 23 06/09/2020   GLUCOSE 96 06/09/2020   BUN 28 (H) 06/09/2020   CREATININE 1.35 (H) 06/09/2020   BILITOT 0.4 04/10/2020   ALKPHOS 95 04/10/2020   AST 13 04/10/2020   ALT 22 04/10/2020   PROT 7.2 04/10/2020   ALBUMIN 4.4 04/10/2020   CALCIUM 9.3 06/09/2020   Lab Results  Component Value Date   CHOL 234 (H) 05/14/2019   Lab Results  Component Value Date   HDL 32 (L) 05/14/2019   Lab Results  Component Value Date   LDLCALC 120 (H) 05/14/2019   Lab Results  Component Value Date   TRIG 460 (H) 05/14/2019   Lab Results  Component Value Date   CHOLHDL 7.3 (H) 05/14/2019   No results found for: HGBA1C     Assessment & Plan:   Problem List Items Addressed This Visit      Other   Weakness generalized - Primary    Patient is reporting unresolved fatigue and generalized weakness since tested positive for COVID-19 on January 17/2022.  Patient has no fever, congestion, nausea or vomiting.  Provided reassurance to patient and education to emphasize the fact that Covid symptoms can take a while to completely go away. Advised patient to treat headache with Tylenol, continue to monitor oxygen saturations. follow-up with worsening or unresolved symptoms.             No orders of the defined types were placed in this encounter.    Ivy Lynn, NP

## 2020-09-28 ENCOUNTER — Encounter (HOSPITAL_COMMUNITY): Payer: Self-pay | Admitting: Physical Therapy

## 2020-09-28 NOTE — Therapy (Signed)
[place], [name], [address] Phone: [phone]   Fax:  [phone]  Patient Details  Name: Erin Khan MRN: [id number] Date of Birth: 07/31/1941 Referring Provider:  No ref. provider found  Encounter Date: 09/28/2020   Episode closed please see D/C summary on last visit.  [name], PT CLT [phone] 09/28/2020, 11:18 AM  [place], [name], [address] Phone: [phone]   Fax:  [phone]

## 2020-10-27 DIAGNOSIS — Z960 Presence of urogenital implants: Secondary | ICD-10-CM | POA: Diagnosis not present

## 2020-12-02 ENCOUNTER — Other Ambulatory Visit: Payer: Self-pay

## 2020-12-02 ENCOUNTER — Encounter: Payer: Self-pay | Admitting: Family

## 2020-12-02 ENCOUNTER — Ambulatory Visit (INDEPENDENT_AMBULATORY_CARE_PROVIDER_SITE_OTHER): Payer: Medicare Other | Admitting: Family

## 2020-12-02 VITALS — BP 145/75 | HR 73 | Temp 97.8°F | Ht 66.0 in | Wt 153.6 lb

## 2020-12-02 DIAGNOSIS — M810 Age-related osteoporosis without current pathological fracture: Secondary | ICD-10-CM

## 2020-12-02 DIAGNOSIS — R5383 Other fatigue: Secondary | ICD-10-CM

## 2020-12-02 DIAGNOSIS — Z0000 Encounter for general adult medical examination without abnormal findings: Secondary | ICD-10-CM | POA: Diagnosis not present

## 2020-12-02 DIAGNOSIS — E782 Mixed hyperlipidemia: Secondary | ICD-10-CM

## 2020-12-02 DIAGNOSIS — I10 Essential (primary) hypertension: Secondary | ICD-10-CM

## 2020-12-02 DIAGNOSIS — N183 Chronic kidney disease, stage 3 unspecified: Secondary | ICD-10-CM | POA: Diagnosis not present

## 2020-12-02 DIAGNOSIS — R6889 Other general symptoms and signs: Secondary | ICD-10-CM | POA: Diagnosis not present

## 2020-12-02 DIAGNOSIS — N819 Female genital prolapse, unspecified: Secondary | ICD-10-CM

## 2020-12-02 MED ORDER — EZETIMIBE 10 MG PO TABS: 10.0000 mg | ORAL_TABLET | Freq: Every day | ORAL | 3 refills | Status: DC

## 2020-12-02 MED ORDER — LISINOPRIL 20 MG PO TABS: 20.0000 mg | ORAL_TABLET | Freq: Every day | ORAL | 3 refills | Status: DC

## 2020-12-02 NOTE — Patient Instructions (Signed)
Osteoporosis  Osteoporosis happens when the bones become thin and less dense than normal. Osteoporosis makes bones more brittle and fragile and more likely to break (fracture). Over time, osteoporosis can cause your bones to become so weak that they fracture after a minor fall. Bones in the hip, wrist, and spine are most likely to fracture due to osteoporosis. What are the causes? The exact cause of this condition is not known. What increases the risk? You are more likely to develop this condition if you:  Have family members with this condition.  Have poor nutrition.  Use the following: ? Steroid medicines, such as prednisone. ? Anti-seizure medicines. ? Nicotine or tobacco, such as cigarettes, e-cigarettes, and chewing tobacco.  Are female.  Are age 50 or older.  Are not physically active (are sedentary).  Are of European or Asian descent.  Have a small body frame. What are the signs or symptoms? A fracture might be the first sign of osteoporosis, especially if the fracture results from a fall or injury that usually would not cause a bone to break. Other signs and symptoms include:  Pain in the neck or low back.  Stooped posture.  Loss of height. How is this diagnosed? This condition may be diagnosed based on:  Your medical history.  A physical exam.  A bone mineral density test, also called a DXA or DEXA test (dual-energy X-ray absorptiometry test). This test uses X-rays to measure the amount of minerals in your bones. How is this treated? This condition may be treated by:  Making lifestyle changes, such as: ? Including foods with more calcium and vitamin D in your diet. ? Doing weight-bearing and muscle-strengthening exercises. ? Stopping tobacco use. ? Limiting alcohol intake.  Taking medicine to slow the process of bone loss or to increase bone density.  Taking daily supplements of calcium and vitamin D.  Taking hormone replacement medicines, such as  estrogen for women and testosterone for men.  Monitoring your levels of calcium and vitamin D. The goal of treatment is to strengthen your bones and lower your risk for a fracture. Follow these instructions at home: Eating and drinking Include calcium and vitamin D in your diet. Calcium is important for bone health, and vitamin D helps your body absorb calcium. Good sources of calcium and vitamin D include:  Certain fatty fish, such as salmon and tuna.  Products that have calcium and vitamin D added to them (are fortified), such as fortified cereals.  Egg yolks.  Cheese.  Liver.   Activity Do exercises as told by your health care provider. Ask your health care provider what exercises and activities are safe for you. You should do:  Exercises that make you work against gravity (weight-bearing exercises), such as tai chi, yoga, or walking.  Exercises to strengthen muscles, such as lifting weights. Lifestyle  Do not drink alcohol if: ? Your health care provider tells you not to drink. ? You are pregnant, may be pregnant, or are planning to become pregnant.  If you drink alcohol: ? Limit how much you use to:  0-1 drink a day for women.  0-2 drinks a day for men.  Know how much alcohol is in your drink. In the [place] one drink equals one 12 oz bottle of beer (355 mL), one 5 oz glass of wine (148 mL), or one 1 oz glass of hard liquor (44 mL).  Do not use any products that contain nicotine or tobacco, such as cigarettes, e-cigarettes, and chewing tobacco.   If you need help quitting, ask your health care provider. Preventing falls  Use devices to help you move around (mobility aids) as needed, such as canes, walkers, scooters, or crutches.  Keep rooms well-lit and clutter-free.  Remove tripping hazards from walkways, including cords and throw rugs.  Install grab bars in bathrooms and safety rails on stairs.  Use rubber mats in the bathroom and other areas that are often wet or  slippery.  Wear closed-toe shoes that fit well and support your feet. Wear shoes that have rubber soles or low heels.  Review your medicines with your health care provider. Some medicines can cause dizziness or changes in blood pressure, which can increase your risk of falling. General instructions  Take over-the-counter and prescription medicines only as told by your health care provider.  Keep all follow-up visits. This is important. Contact a health care provider if:  You have never been screened for osteoporosis and you are: ? A woman who is age 42 or older. ? A man who is age 75 or older. Get help right away if:  You fall or injure yourself. Summary  Osteoporosis is thinning and loss of density in your bones. This makes bones more brittle and fragile and more likely to break (fracture),even with minor falls.  The goal of treatment is to strengthen your bones and lower your risk for a fracture.  Include calcium and vitamin D in your diet. Calcium is important for bone health, and vitamin D helps your body absorb calcium.  Talk with your health care provider about screening for osteoporosis if you are a woman who is age 64 or older, or a man who is age 45 or older. This information is not intended to replace advice given to you by your health care provider. Make sure you discuss any questions you have with your health care provider. Document Revised: 01/30/2020 Document Reviewed: 01/30/2020 Elsevier Patient Education  2021 [place]. Hypertension, Adult High blood pressure (hypertension) is when the force of blood pumping through the arteries is too strong. The arteries are the blood vessels that carry blood from the heart throughout the body. Hypertension forces the heart to work harder to pump blood and may cause arteries to become narrow or stiff. Untreated or uncontrolled hypertension can cause a heart attack, heart failure, a stroke, kidney disease, and other problems. A  blood pressure reading consists of a higher number over a lower number. Ideally, your blood pressure should be below 120/80. The first ("top") number is called the systolic pressure. It is a measure of the pressure in your arteries as your heart beats. The second ("bottom") number is called the diastolic pressure. It is a measure of the pressure in your arteries as the heart relaxes. What are the causes? The exact cause of this condition is not known. There are some conditions that result in or are related to high blood pressure. What increases the risk? Some risk factors for high blood pressure are under your control. The following factors may make you more likely to develop this condition:  Smoking.  Having type 2 diabetes mellitus, high cholesterol, or both.  Not getting enough exercise or physical activity.  Being overweight.  Having too much fat, sugar, calories, or salt (sodium) in your diet.  Drinking too much alcohol. Some risk factors for high blood pressure may be difficult or impossible to change. Some of these factors include:  Having chronic kidney disease.  Having a family history of high blood pressure.  Age. Risk increases with age.  Race. You may be at higher risk if you are African American.  Gender. Men are at higher risk than women before age 32. After age 31, women are at higher risk than men.  Having obstructive sleep apnea.  Stress. What are the signs or symptoms? High blood pressure may not cause symptoms. Very high blood pressure (hypertensive crisis) may cause:  Headache.  Anxiety.  Shortness of breath.  Nosebleed.  Nausea and vomiting.  Vision changes.  Severe chest pain.  Seizures. How is this diagnosed? This condition is diagnosed by measuring your blood pressure while you are seated, with your arm resting on a flat surface, your legs uncrossed, and your feet flat on the floor. The cuff of the blood pressure monitor will be placed  directly against the skin of your upper arm at the level of your heart. It should be measured at least twice using the same arm. Certain conditions can cause a difference in blood pressure between your right and left arms. Certain factors can cause blood pressure readings to be lower or higher than normal for a short period of time:  When your blood pressure is higher when you are in a health care provider's office than when you are at home, this is called white coat hypertension. Most people with this condition do not need medicines.  When your blood pressure is higher at home than when you are in a health care provider's office, this is called masked hypertension. Most people with this condition may need medicines to control blood pressure. If you have a high blood pressure reading during one visit or you have normal blood pressure with other risk factors, you may be asked to:  Return on a different day to have your blood pressure checked again.  Monitor your blood pressure at home for 1 week or longer. If you are diagnosed with hypertension, you may have other blood or imaging tests to help your health care provider understand your overall risk for other conditions. How is this treated? This condition is treated by making healthy lifestyle changes, such as eating healthy foods, exercising more, and reducing your alcohol intake. Your health care provider may prescribe medicine if lifestyle changes are not enough to get your blood pressure under control, and if:  Your systolic blood pressure is above 130.  Your diastolic blood pressure is above 80. Your personal target blood pressure may vary depending on your medical conditions, your age, and other factors. Follow these instructions at home: Eating and drinking  Eat a diet that is high in fiber and potassium, and low in sodium, added sugar, and fat. An example eating plan is called the DASH (Dietary Approaches to Stop Hypertension) diet. To  eat this way: ? Eat plenty of fresh fruits and vegetables. Try to fill one half of your plate at each meal with fruits and vegetables. ? Eat whole grains, such as whole-wheat pasta, brown rice, or whole-grain bread. Fill about one fourth of your plate with whole grains. ? Eat or drink low-fat dairy products, such as skim milk or low-fat yogurt. ? Avoid fatty cuts of meat, processed or cured meats, and poultry with skin. Fill about one fourth of your plate with lean proteins, such as fish, chicken without skin, beans, eggs, or tofu. ? Avoid pre-made and processed foods. These tend to be higher in sodium, added sugar, and fat.  Reduce your daily sodium intake. Most people with hypertension should eat less than 1,500  mg of sodium a day.  Do not drink alcohol if: ? Your health care provider tells you not to drink. ? You are pregnant, may be pregnant, or are planning to become pregnant.  If you drink alcohol: ? Limit how much you use to:  0-1 drink a day for women.  0-2 drinks a day for men. ? Be aware of how much alcohol is in your drink. In the [place] one drink equals one 12 oz bottle of beer (355 mL), one 5 oz glass of wine (148 mL), or one 1 oz glass of hard liquor (44 mL).   Lifestyle  Work with your health care provider to maintain a healthy body weight or to lose weight. Ask what an ideal weight is for you.  Get at least 30 minutes of exercise most days of the week. Activities may include walking, swimming, or biking.  Include exercise to strengthen your muscles (resistance exercise), such as Pilates or lifting weights, as part of your weekly exercise routine. Try to do these types of exercises for 30 minutes at least 3 days a week.  Do not use any products that contain nicotine or tobacco, such as cigarettes, e-cigarettes, and chewing tobacco. If you need help quitting, ask your health care provider.  Monitor your blood pressure at home as told by your health care provider.  Keep  all follow-up visits as told by your health care provider. This is important.   Medicines  Take over-the-counter and prescription medicines only as told by your health care provider. Follow directions carefully. Blood pressure medicines must be taken as prescribed.  Do not skip doses of blood pressure medicine. Doing this puts you at risk for problems and can make the medicine less effective.  Ask your health care provider about side effects or reactions to medicines that you should watch for. Contact a health care provider if you:  Think you are having a reaction to a medicine you are taking.  Have headaches that keep coming back (recurring).  Feel dizzy.  Have swelling in your ankles.  Have trouble with your vision. Get help right away if you:  Develop a severe headache or confusion.  Have unusual weakness or numbness.  Feel faint.  Have severe pain in your chest or abdomen.  Vomit repeatedly.  Have trouble breathing. Summary  Hypertension is when the force of blood pumping through your arteries is too strong. If this condition is not controlled, it may put you at risk for serious complications.  Your personal target blood pressure may vary depending on your medical conditions, your age, and other factors. For most people, a normal blood pressure is less than 120/80.  Hypertension is treated with lifestyle changes, medicines, or a combination of both. Lifestyle changes include losing weight, eating a healthy, low-sodium diet, exercising more, and limiting alcohol. This information is not intended to replace advice given to you by your health care provider. Make sure you discuss any questions you have with your health care provider. Document Revised: 04/25/2018 Document Reviewed: 04/25/2018 Elsevier Patient Education  2021 [employer].

## 2020-12-02 NOTE — Progress Notes (Signed)
Subjective:    Patient ID: Erin Khan, female    DOB: [birth year], 80 y.o.   MRN: [id number]  Chief Complaint  Patient presents with  . Annual Exam    NO PAP , patient low on energy    PT presents to the office today for CPE. She is followed by GYN annually. She is followed by Urologists every 3 months vaginal prolapse and pessary maintenance.   She is followed by ENT for dizziness.   Her BP is elevated today, but states her BP at home was 145/75. She reports she checks this once a week.   She does report fatigue that she has noticed over the last 2 months. She states this is unchanged.  Hypertension This is a chronic problem. The current episode started more than 1 year ago. The problem has been waxing and waning since onset. The problem is uncontrolled. Associated symptoms include malaise/fatigue. Pertinent negatives include no peripheral edema or shortness of breath. Risk factors for coronary artery disease include sedentary lifestyle. The current treatment provides moderate improvement.  Hyperlipidemia This is a chronic problem. The current episode started more than 1 year ago. Pertinent negatives include no shortness of breath. Current antihyperlipidemic treatment includes ezetimibe. The current treatment provides moderate improvement of lipids. Risk factors for coronary artery disease include hypertension and post-menopausal.  Osteoporosis  She takes calcium and VIT d daily. She reports she walks daily for 15 mins. She is due for her dexa scan today.    Review of Systems  Constitutional: Positive for malaise/fatigue.  Respiratory: Negative for shortness of breath.   All other systems reviewed and are negative.  Family History  Problem Relation Age of Onset  . Lung cancer Sister   . Hypertension Father   . Heart attack Brother   . Arthritis Sister   . Asthma Brother   . Healthy Daughter   . Healthy Son   . Colon cancer Neg Hx    Social History   Socioeconomic History   . Marital status: Married    Spouse name: Collins Scotland  . Number of children: 2  . Years of education: 6  . Highest education level: 12th grade  Occupational History  . Occupation: Retired    Comment: Environmental health practitioner  . Occupation: [employer]    Employer: [employer]    Comment: 20 to 25 hours a week part time  Tobacco Use  . Smoking status: Never Smoker  . Smokeless tobacco: Never Used  Vaping Use  . Vaping Use: Never used  Substance and Sexual Activity  . Alcohol use: No  . Drug use: No  . Sexual activity: Yes  Other Topics Concern  . Not on file  Social History Narrative   She lives with her husband, Collins Scotland.   She  has eight grandchildren.   Social Determinants of Health   Financial Resource Strain: Not on file  Food Insecurity: Not on file  Transportation Needs: Not on file  Physical Activity: Not on file  Stress: Not on file  Social Connections: Not on file       Objective:   Physical Exam Vitals reviewed.  Constitutional:      General: She is not in acute distress.    Appearance: She is well-developed.  HENT:     Head: Normocephalic and atraumatic.     Right Ear: Tympanic membrane normal.     Left Ear: Tympanic membrane normal.  Eyes:     Pupils: Pupils are equal, round, and reactive to light.  Neck:     Thyroid: No thyromegaly.  Cardiovascular:     Rate and Rhythm: Normal rate and regular rhythm.     Heart sounds: Normal heart sounds. No murmur heard.   Pulmonary:     Effort: Pulmonary effort is normal. No respiratory distress.     Breath sounds: Normal breath sounds. No wheezing.  Abdominal:     General: Bowel sounds are normal. There is no distension.     Palpations: Abdomen is soft.     Tenderness: There is no abdominal tenderness.  Musculoskeletal:        General: No tenderness. Normal range of motion.     Cervical back: Normal range of motion and neck supple.  Skin:    General: Skin is warm and dry.  Neurological:     Mental Status: She is  alert and oriented to person, place, and time.     Cranial Nerves: No cranial nerve deficit.     Deep Tendon Reflexes: Reflexes are normal and symmetric.  Psychiatric:        Behavior: Behavior normal.        Thought Content: Thought content normal.        Judgment: Judgment normal.          BP (!) 184/84   Pulse 73   Temp 97.8 F (36.6 C) (Temporal)   Ht '5\' 6"'  (1.676 m)   Wt 153 lb 9.6 oz (69.7 kg)   BMI 24.79 kg/m   Assessment & Plan:  Erin Khan comes in today with chief complaint of Annual Exam (NO PAP , patient low on energy )   Diagnosis and orders addressed:  1. Mixed hyperlipidemia - ezetimibe (ZETIA) 10 MG tablet; Take 1 tablet (10 mg total) by mouth daily.  Dispense: 90 tablet; Refill: 3 - CMP14+EGFR - CBC with Differential/Platelet - Lipid panel  2. Primary hypertension Reports her BP at home is 145/75. Continue to check this at home. - CMP14+EGFR - CBC with Differential/Platelet  3. Stage 3 chronic kidney disease, unspecified whether stage 3a or 3b CKD (HCC) - CMP14+EGFR - CBC with Differential/Platelet  4. Annual physical exam - CMP14+EGFR - CBC with Differential/Platelet - Lipid panel - TSH - Vitamin B12  5. Age-related osteoporosis without current pathological fracture -Pt will get dexa scan on next visit.  - CMP14+EGFR - CBC with Differential/Platelet  6. Vaginal vault prolapse - CMP14+EGFR - CBC with Differential/Platelet  7. Fatigue, unspecified type - CMP14+EGFR - CBC with Differential/Platelet - TSH - Vitamin B12   Labs pending Health Maintenance reviewed Diet and exercise encouraged  Follow up plan: 6 months   Evelina Dun, FNP

## 2020-12-03 ENCOUNTER — Other Ambulatory Visit: Payer: Self-pay | Admitting: Family

## 2020-12-03 LAB — CMP14+EGFR
ALT: 15 IU/L (ref 0–32)
AST: 17 IU/L (ref 0–40)
Albumin/Globulin Ratio: 1.6 (ref 1.2–2.2)
Albumin: 4.2 g/dL (ref 3.7–4.7)
Alkaline Phosphatase: 79 IU/L (ref 44–121)
BUN/Creatinine Ratio: 22 (ref 12–28)
BUN: 27 mg/dL (ref 8–27)
Bilirubin Total: 0.5 mg/dL (ref 0.0–1.2)
CO2: 20 mmol/L (ref 20–29)
Calcium: 9.4 mg/dL (ref 8.7–10.3)
Chloride: 102 mmol/L (ref 96–106)
Creatinine, Ser: 1.21 mg/dL — ABNORMAL HIGH (ref 0.57–1.00)
Globulin, Total: 2.7 g/dL (ref 1.5–4.5)
Glucose: 89 mg/dL (ref 65–99)
Potassium: 4.1 mmol/L (ref 3.5–5.2)
Sodium: 140 mmol/L (ref 134–144)
Total Protein: 6.9 g/dL (ref 6.0–8.5)
eGFR: 46 mL/min/{1.73_m2} — ABNORMAL LOW (ref >59)

## 2020-12-03 LAB — CBC WITH DIFFERENTIAL/PLATELET
Basophils Absolute: 0.1 10*3/uL (ref 0.0–0.2)
Basos: 1 %
EOS (ABSOLUTE): 0.1 10*3/uL (ref 0.0–0.4)
Eos: 2 %
Hematocrit: 38.6 % (ref 34.0–46.6)
Hemoglobin: 12.9 g/dL (ref 11.1–15.9)
Immature Grans (Abs): 0 10*3/uL (ref 0.0–0.1)
Immature Granulocytes: 0 %
Lymphocytes Absolute: 1.9 10*3/uL (ref 0.7–3.1)
Lymphs: 29 %
MCH: 31 pg (ref 26.6–33.0)
MCHC: 33.4 g/dL (ref 31.5–35.7)
MCV: 93 fL (ref 79–97)
Monocytes Absolute: 0.8 10*3/uL (ref 0.1–0.9)
Monocytes: 12 %
Neutrophils Absolute: 3.6 10*3/uL (ref 1.4–7.0)
Neutrophils: 56 %
Platelets: 192 10*3/uL (ref 150–450)
RBC: 4.16 x10E6/uL (ref 3.77–5.28)
RDW: 12.8 % (ref 11.7–15.4)
WBC: 6.6 10*3/uL (ref 3.4–10.8)

## 2020-12-03 LAB — LIPID PANEL
Chol/HDL Ratio: 5.1 ratio — ABNORMAL HIGH (ref 0.0–4.4)
Cholesterol, Total: 215 mg/dL — ABNORMAL HIGH (ref 100–199)
HDL: 42 mg/dL (ref >39)
LDL Chol Calc (NIH): 130 mg/dL — ABNORMAL HIGH (ref 0–99)
Triglycerides: 242 mg/dL — ABNORMAL HIGH (ref 0–149)
VLDL Cholesterol Cal: 43 mg/dL — ABNORMAL HIGH (ref 5–40)

## 2020-12-03 LAB — TSH: TSH: 0.608 u[IU]/mL (ref 0.450–4.500)

## 2020-12-03 LAB — VITAMIN B12: Vitamin B-12: 535 pg/mL (ref 232–1245)

## 2020-12-03 MED ORDER — ROSUVASTATIN CALCIUM 10 MG PO TABS: 10.0000 mg | ORAL_TABLET | Freq: Every day | ORAL | 3 refills | Status: DC

## 2020-12-15 ENCOUNTER — Ambulatory Visit (INDEPENDENT_AMBULATORY_CARE_PROVIDER_SITE_OTHER): Payer: Medicare Other

## 2020-12-15 DIAGNOSIS — Z0000 Encounter for general adult medical examination without abnormal findings: Secondary | ICD-10-CM | POA: Diagnosis not present

## 2020-12-15 NOTE — Progress Notes (Signed)
MEDICARE ANNUAL WELLNESS VISIT  12/15/2020  Telephone Visit Disclaimer This Medicare AWV was conducted by telephone due to national recommendations for restrictions regarding the COVID-19 Pandemic (e.g. social distancing).  I verified, using two identifiers, that I am speaking with Karyl Kinnier or their authorized healthcare agent. I discussed the limitations, risks, security, and privacy concerns of performing an evaluation and management service by telephone and the potential availability of an in-person appointment in the future. The patient expressed understanding and agreed to proceed.  Location of Patient: Home Location of Provider (nurse):  [facility]  Subjective:    ANSLEY MANGIAPANE is a 80 y.o. female patient of Hawks, Theador Hawthorne, FNP who had a Medicare Annual Wellness Visit today via telephone. Dusti is Retired and lives with their spouse. she has two children. she reports that she is socially active and does interact with friends/family regularly. she is minimally physically active and enjoys yardwork.  Patient Care Team: Sharion Balloon, FNP as PCP - General (Family Medicine) Paula Compton, MD as Consulting Physician (Obstetrics and Gynecology) Rogene Houston, MD as Consulting Physician (Gastroenterology) Ilean China, RN as Registered Nurse  Advanced Directives 12/15/2020 06/22/2020 05/21/2019 10/18/2018 01/15/2018 12/08/2016 05/15/2013  Does Patient Have a Medical Advance Directive? Yes No No No No No Patient does not have advance directive;Patient would not like information  Type of Advance Directive [place]  Does patient want to make changes to medical advance directive? No - Patient declined - - - - - -  Would patient like information on creating a medical advance directive? - No - Patient declined No - Patient declined No - Patient declined Yes (MAU/Ambulatory/Procedural Areas - Information given) Yes  (MAU/Ambulatory/Procedural Areas - Information given) -  Pre-existing out of facility DNR order (yellow form or pink MOST form) - - - - - - No    Hospital Utilization Over the Past 12 Months: # of hospitalizations or ER visits: 0 # of surgeries: 0  Review of Systems    Patient reports that her overall health is unchanged compared to last year.  Patient Reported Readings (BP, Pulse, CBG, Weight, etc) none  Pain Assessment Pain : No/denies pain     Current Medications & Allergies (verified) Allergies as of 12/15/2020      Reactions   Asa [aspirin] Hives, Swelling   Statins    "made me feel funny"      Medication List       Accurate as of December 15, 2020  8:56 AM. If you have any questions, ask your nurse or doctor.        BLACK ELDERBERRY PO Take by mouth.   CALCIUM 600+D PO Take 1 tablet by mouth daily.   ezetimibe 10 MG tablet Commonly known as: ZETIA Take 1 tablet (10 mg total) by mouth daily.   lisinopril 20 MG tablet Commonly known as: ZESTRIL Take 1 tablet (20 mg total) by mouth daily.   rosuvastatin 10 MG tablet Commonly known as: Crestor Take 1 tablet (10 mg total) by mouth daily.   vitamin B-12 500 MCG tablet Commonly known as: CYANOCOBALAMIN Take 500 mcg by mouth daily.   Vitamin D2 10 MCG (400 UNIT) Tabs Take 400 Units by mouth daily.       History (reviewed): Past Medical History:  Diagnosis Date  . Cataract   . Colon polyps   . Hypercholesteremia   . Hypertension  Past Surgical History:  Procedure Laterality Date  . ABDOMINAL HYSTERECTOMY    . BILATERAL OOPHORECTOMY    . CATARACT EXTRACTION, BILATERAL  2014  . CHOLECYSTECTOMY  1980  . COLONOSCOPY    . COLONOSCOPY N/A 05/15/2013   Procedure: COLONOSCOPY;  Surgeon: Rogene Houston, MD;  [facility];  Service: Endoscopy;  Laterality: N/A;  930  . COLONOSCOPY N/A 10/18/2018   Procedure: COLONOSCOPY;  Surgeon: Rogene Houston, MD;  Location: [facility];  Service:  Endoscopy;  Laterality: N/A;  830  . cranial shunt     17 years ago  . POLYPECTOMY  10/18/2018   Procedure: POLYPECTOMY;  Surgeon: Rogene Houston, MD;  Location: [facility];  Service: Endoscopy;;  colon   Family History  Problem Relation Age of Onset  . Lung cancer Sister   . Hypertension Father   . Heart attack Brother   . Arthritis Sister   . Asthma Brother   . Healthy Daughter   . Healthy Son   . Colon cancer Neg Hx    Social History   Socioeconomic History  . Marital status: Married    Spouse name: Collins Scotland  . Number of children: 2  . Years of education: 8  . Highest education level: 12th grade  Occupational History  . Occupation: Retired    Comment: Environmental health practitioner  . Occupation: [employer]    Employer: [employer]    Comment: 20 to 25 hours a week part time  Tobacco Use  . Smoking status: Never Smoker  . Smokeless tobacco: Never Used  Vaping Use  . Vaping Use: Never used  Substance and Sexual Activity  . Alcohol use: No  . Drug use: No  . Sexual activity: Yes  Other Topics Concern  . Not on file  Social History Narrative   She lives with her husband, Collins Scotland.   She  has eight grandchildren.   Social Determinants of Health   Financial Resource Strain: Not on file  Food Insecurity: Not on file  Transportation Needs: Not on file  Physical Activity: Not on file  Stress: Not on file  Social Connections: Not on file    Activities of Daily Living In your present state of health, do you have any difficulty performing the following activities: 12/15/2020  Hearing? N  Vision? N  Difficulty concentrating or making decisions? N  Walking or climbing stairs? N  Dressing or bathing? N  Doing errands, shopping? N  Preparing Food and eating ? N  Using the Toilet? N  In the past six months, have you accidently leaked urine? N  Do you have problems with loss of bowel control? N  Managing your Medications? N  Managing your Finances? N  Housekeeping or managing your  Housekeeping? N  Some recent data might be hidden    Patient Education/ Literacy How often do you need to have someone help you when you read instructions, pamphlets, or other written materials from your doctor or pharmacy?: 2 - Rarely What is the last grade level you completed in school?: 12th grade  Exercise Current Exercise Habits: Home exercise routine, Time (Minutes): 15, Frequency (Times/Week): 5, Weekly Exercise (Minutes/Week): 75, Intensity: Mild  Diet Patient reports consuming 2 meals a day and 1 snack(s) a day Patient reports that her primary diet is: Regular Patient reports that she does have regular access to food.   Depression Screen PHQ 2/9 Scores 12/15/2020 12/02/2020 05/19/2020 04/10/2020 05/21/2019 05/30/2018 05/24/2018  PHQ - 2 Score 0 0  0 0 0 0 0     Fall Risk Fall Risk  12/15/2020 12/02/2020 05/19/2020 04/10/2020 05/21/2019  Falls in the past year? 0 0 0 0 0  Number falls in past yr: - - - - -  Injury with Fall? - - - - -     Objective:  Burnet seemed alert and oriented and she participated appropriately during our telephone visit.  Blood Pressure Weight BMI  BP Readings from Last 3 Encounters:  12/02/20 (!) 145/75  09/24/20 138/74  06/09/20 (!) 174/85   Wt Readings from Last 3 Encounters:  12/02/20 153 lb 9.6 oz (69.7 kg)  06/09/20 156 lb (70.8 kg)  05/19/20 157 lb 3.2 oz (71.3 kg)   BMI Readings from Last 1 Encounters:  12/02/20 24.79 kg/m    *Unable to obtain current vital signs, weight, and BMI due to telephone visit type  Hearing/Vision  . Sydna did not seem to have difficulty with hearing/understanding during the telephone conversation . Reports that she has not had a formal eye exam by an eye care professional within the past year . Reports that she has not had a formal hearing evaluation within the past year *Unable to fully assess hearing and vision during telephone visit type  Cognitive Function: 6CIT Screen 12/15/2020 05/21/2019  What Year? 0  points 0 points  What month? 0 points 0 points  What time? 0 points 0 points  Count back from 20 0 points 0 points  Months in reverse 0 points 0 points  Repeat phrase 2 points 0 points  Total Score 2 0   (Normal:0-7, Significant for Dysfunction: >8)  Normal Cognitive Function Screening: Yes   Immunization & Health Maintenance Record Immunization History  Administered Date(s) Administered  . Fluad Quad(high Dose 65+) 06/03/2019, 05/19/2020  . Influenza Inj Mdck Quad Pf 06/03/2019  . Influenza, High Dose Seasonal PF 05/30/2017, 06/12/2018  . Influenza,inj,Quad PF,6+ Mos 05/28/2016  . Moderna Sars-Covid-2 Vaccination 10/14/2019, 11/11/2019, 07/07/2020  . Pneumococcal Conjugate-13 05/29/2014, 07/25/2016  . Pneumococcal Polysaccharide-23 03/22/2016    Health Maintenance  Topic Date Due  . DEXA SCAN  04/10/2021 (Originally 12/09/2018)  . TETANUS/TDAP  04/10/2021 (Originally 05/13/1960)  . INFLUENZA VACCINE  03/29/2021  . COVID-19 Vaccine  Completed  . Hepatitis C Screening  Completed  . PNA vac Low Risk Adult  Completed  . HPV VACCINES  Aged Out       Assessment  This is a routine wellness examination for Ayleen Mckinstry Kever.  Health Maintenance: Due or Overdue There are no preventive care reminders to display for this patient.  Marthann Schiller Cenci does not need a referral for Community Assistance: Care Management:   no Social Work:    no Prescription Assistance:  no Nutrition/Diabetes Education:  no   Plan:  Personalized Goals Goals Addressed            This Visit's Progress   . Exercise 150 min/wk Moderate Activity   On track   . Increase physical activity   On track    Walks briskly for almost a mile three times weekly.  She also works in [employer] , does house work and tends to grand children to increase physical activity level.  She was encouraged to try to walk more often.    . Prevent falls   On track     Personalized Health Maintenance & Screening Recommendations   Td vaccine  Lung Cancer Screening Recommended: no (Low Dose CT Chest recommended if Age 43-80  years, 30 pack-year currently smoking OR have quit w/in past 15 years) Hepatitis C Screening recommended: no HIV Screening recommended: no  Advanced Directives: Written information was not prepared per patient's request.  Referrals & Orders No orders of the defined types were placed in this encounter.   Follow-up Plan . Follow-up with Sharion Balloon, FNP as planned . Please call the office for your yearly exam which is due April of 2023.     I have personally reviewed and noted the following in the patient's chart:   . Medical and social history . Use of alcohol, tobacco or illicit drugs  . Current medications and supplements . Functional ability and status . Nutritional status . Physical activity . Advanced directives . List of other physicians . Hospitalizations, surgeries, and ER visits in previous 12 months . Vitals . Screenings to include cognitive, depression, and falls . Referrals and appointments  In addition, I have reviewed and discussed with Marthann Schiller Stones certain preventive protocols, quality metrics, and best practice recommendations. A written personalized care plan for preventive services as well as general preventive health recommendations is available and can be mailed to the patient at her request.      Maud Deed [facility]  [id number]

## 2020-12-15 NOTE — Patient Instructions (Signed)
  [place] Maintenance Summary and Written Plan of Care  Ms. Eppard ,  Thank you for allowing me to perform your Medicare Annual Wellness Visit and for your ongoing commitment to your health.   Health Maintenance & Immunization History Health Maintenance  Topic Date Due  . DEXA SCAN  04/10/2021 (Originally 12/09/2018)  . TETANUS/TDAP  04/10/2021 (Originally 05/13/1960)  . INFLUENZA VACCINE  03/29/2021  . COVID-19 Vaccine  Completed  . Hepatitis C Screening  Completed  . PNA vac Low Risk Adult  Completed  . HPV VACCINES  Aged Out   Immunization History  Administered Date(s) Administered  . Fluad Quad(high Dose 65+) 06/03/2019, 05/19/2020  . Influenza Inj Mdck Quad Pf 06/03/2019  . Influenza, High Dose Seasonal PF 05/30/2017, 06/12/2018  . Influenza,inj,Quad PF,6+ Mos 05/28/2016  . Moderna Sars-Covid-2 Vaccination 10/14/2019, 11/11/2019, 07/07/2020  . Pneumococcal Conjugate-13 05/29/2014, 07/25/2016  . Pneumococcal Polysaccharide-23 03/22/2016    These are the patient goals that we discussed: Goals Addressed            This Visit's Progress   . Exercise 150 min/wk Moderate Activity   On track   . Increase physical activity   On track    Walks briskly for almost a mile three times weekly.  She also works in [employer] , does house work and tends to grand children to increase physical activity level.  She was encouraged to try to walk more often.    . Prevent falls   On track       This is a list of Health Maintenance Items that are overdue or due now: There are no preventive care reminders to display for this patient.   Orders/Referrals Placed Today: No orders of the defined types were placed in this encounter.  (Contact our referral department at [phone] if you have not spoken with someone about your referral appointment within the next 5 days)    Follow-up Plan  Please call to schedule your yearly check up with Evelina Dun, FNP.  You are due to return April of 2023.

## 2021-01-13 DIAGNOSIS — S4992XA Unspecified injury of left shoulder and upper arm, initial encounter: Secondary | ICD-10-CM | POA: Diagnosis not present

## 2021-01-13 DIAGNOSIS — M79605 Pain in left leg: Secondary | ICD-10-CM | POA: Diagnosis not present

## 2021-01-13 DIAGNOSIS — E119 Type 2 diabetes mellitus without complications: Secondary | ICD-10-CM | POA: Diagnosis not present

## 2021-01-13 DIAGNOSIS — Z743 Need for continuous supervision: Secondary | ICD-10-CM | POA: Diagnosis not present

## 2021-01-13 DIAGNOSIS — S40012A Contusion of left shoulder, initial encounter: Secondary | ICD-10-CM | POA: Diagnosis not present

## 2021-01-13 DIAGNOSIS — I10 Essential (primary) hypertension: Secondary | ICD-10-CM | POA: Diagnosis not present

## 2021-01-13 DIAGNOSIS — R6889 Other general symptoms and signs: Secondary | ICD-10-CM | POA: Diagnosis not present

## 2021-01-13 DIAGNOSIS — S40022A Contusion of left upper arm, initial encounter: Secondary | ICD-10-CM | POA: Diagnosis not present

## 2021-01-13 DIAGNOSIS — S5012XA Contusion of left forearm, initial encounter: Secondary | ICD-10-CM | POA: Diagnosis not present

## 2021-01-13 DIAGNOSIS — M25519 Pain in unspecified shoulder: Secondary | ICD-10-CM | POA: Diagnosis not present

## 2021-01-13 DIAGNOSIS — S199XXA Unspecified injury of neck, initial encounter: Secondary | ICD-10-CM | POA: Diagnosis not present

## 2021-01-13 DIAGNOSIS — M47812 Spondylosis without myelopathy or radiculopathy, cervical region: Secondary | ICD-10-CM | POA: Diagnosis not present

## 2021-01-13 DIAGNOSIS — M4802 Spinal stenosis, cervical region: Secondary | ICD-10-CM | POA: Diagnosis not present

## 2021-01-13 DIAGNOSIS — I700 Atherosclerosis of aorta: Secondary | ICD-10-CM | POA: Diagnosis not present

## 2021-01-13 DIAGNOSIS — M50821 Other cervical disc disorders at C4-C5 level: Secondary | ICD-10-CM | POA: Diagnosis not present

## 2021-01-13 DIAGNOSIS — Z79899 Other long term (current) drug therapy: Secondary | ICD-10-CM | POA: Diagnosis not present

## 2021-01-13 DIAGNOSIS — S8992XA Unspecified injury of left lower leg, initial encounter: Secondary | ICD-10-CM | POA: Diagnosis not present

## 2021-01-13 DIAGNOSIS — S0093XA Contusion of unspecified part of head, initial encounter: Secondary | ICD-10-CM | POA: Diagnosis not present

## 2021-01-13 DIAGNOSIS — M810 Age-related osteoporosis without current pathological fracture: Secondary | ICD-10-CM | POA: Diagnosis not present

## 2021-01-13 DIAGNOSIS — M25512 Pain in left shoulder: Secondary | ICD-10-CM | POA: Diagnosis not present

## 2021-01-13 DIAGNOSIS — I6523 Occlusion and stenosis of bilateral carotid arteries: Secondary | ICD-10-CM | POA: Diagnosis not present

## 2021-01-13 DIAGNOSIS — M50321 Other cervical disc degeneration at C4-C5 level: Secondary | ICD-10-CM | POA: Diagnosis not present

## 2021-01-13 DIAGNOSIS — E041 Nontoxic single thyroid nodule: Secondary | ICD-10-CM | POA: Diagnosis not present

## 2021-01-13 DIAGNOSIS — Z982 Presence of cerebrospinal fluid drainage device: Secondary | ICD-10-CM | POA: Diagnosis not present

## 2021-01-13 DIAGNOSIS — M79602 Pain in left arm: Secondary | ICD-10-CM | POA: Diagnosis not present

## 2021-01-13 DIAGNOSIS — G9389 Other specified disorders of brain: Secondary | ICD-10-CM | POA: Diagnosis not present

## 2021-01-14 ENCOUNTER — Telehealth: Payer: Self-pay | Admitting: Family

## 2021-01-14 NOTE — Telephone Encounter (Signed)
Spoke with patient, appointment scheduled tomorrow at 9:30 am with Marjorie Smolder.

## 2021-01-15 ENCOUNTER — Ambulatory Visit (INDEPENDENT_AMBULATORY_CARE_PROVIDER_SITE_OTHER): Payer: Medicare Other | Admitting: Family Medicine

## 2021-01-15 ENCOUNTER — Other Ambulatory Visit: Payer: Self-pay

## 2021-01-15 ENCOUNTER — Encounter: Payer: Self-pay | Admitting: Family Medicine

## 2021-01-15 VITALS — BP 159/78 | HR 82 | Temp 97.8°F | Ht 66.0 in | Wt 153.4 lb

## 2021-01-15 DIAGNOSIS — S8012XA Contusion of left lower leg, initial encounter: Secondary | ICD-10-CM | POA: Diagnosis not present

## 2021-01-15 DIAGNOSIS — S40022D Contusion of left upper arm, subsequent encounter: Secondary | ICD-10-CM

## 2021-01-15 DIAGNOSIS — Z09 Encounter for follow-up examination after completed treatment for conditions other than malignant neoplasm: Secondary | ICD-10-CM

## 2021-01-15 DIAGNOSIS — S40022A Contusion of left upper arm, initial encounter: Secondary | ICD-10-CM | POA: Diagnosis not present

## 2021-01-15 DIAGNOSIS — S8012XD Contusion of left lower leg, subsequent encounter: Secondary | ICD-10-CM

## 2021-01-15 MED ORDER — FUROSEMIDE 20 MG PO TABS: 20.0000 mg | ORAL_TABLET | Freq: Every day | ORAL | 0 refills | Status: DC

## 2021-01-15 NOTE — Patient Instructions (Addendum)
Hematoma A hematoma is a collection of blood under the skin, in an organ, in a body space, in a joint space, or in other tissue. The blood can thicken (clot) to form a lump that you can see and feel. The lump is often firm and may become sore and tender. Most hematomas get better in a few days to weeks. However, some hematomas may be serious and require medical care. Hematomas can range from very small to very large. What are the causes? This condition is caused by:  A blunt or penetrating injury.  A leakage from a blood vessel under the skin.  Some medical procedures, including surgeries, such as oral surgery, face lifts, and surgeries on the joints.  Some medical conditions that cause bleeding or bruising. There may be multiple hematomas that appear in different areas of the body. What increases the risk? You are more likely to develop this condition if:  You are an older adult.  You use blood thinners. What are the signs or symptoms? Symptoms of this condition depend on where the hematoma is located.  Common symptoms of a hematoma that is under the skin include:  A firm lump on the body.  Pain and tenderness in the area.  Bruising. Blue, dark blue, purple-red, or yellowish skin (discoloration) may appear at the site of the hematoma if the hematoma is close to the surface of the skin. Common symptoms of a hematoma that is deep in the tissues or body spaces may be less obvious. They include:  A collection of blood in the stomach (intra-abdominal hematoma). This may cause pain in the abdomen, weakness, fainting, and shortness of breath.  A collection of blood in the head (intracranial hematoma). This may cause a headache or symptoms such as weakness, trouble speaking or understanding, or a change in consciousness.   How is this diagnosed? This condition is diagnosed based on:  Your medical history.  A physical exam.  Imaging tests, such as an ultrasound or CT scan. These may  be needed if your health care provider suspects a hematoma in deeper tissues or body spaces.  Blood tests. These may be needed if your health care provider believes that the hematoma is caused by a medical condition. How is this treated? Treatment for this condition depends on the cause, size, and location of the hematoma. Treatment may include:  Doing nothing. The majority of hematomas do not need treatment as many of them go away on their own over time.  Surgery or close monitoring. This may be needed for large hematomas or hematomas that affect vital organs.  Medicines. Medicines may be given if there is an underlying medical cause for the hematoma. Follow these instructions at home: Managing pain, stiffness, and swelling  If directed, put ice on the affected area. ? Put ice in a plastic bag. ? Place a towel between your skin and the bag. ? Leave the ice on for 20 minutes, 2-3 times a day for the first couple of days.  If directed, apply heat to the affected area after applying ice for a couple of days. Use the heat source that your health care provider recommends, such as a moist heat pack or a heating pad. ? Place a towel between your skin and the heat source. ? Leave the heat on for 20-30 minutes. ? Remove the heat if your skin turns bright red. This is especially important if you are unable to feel pain, heat, or cold. You may have a greater  risk of getting burned.  Raise (elevate) the affected area above the level of your heart while you are sitting or lying down.  If told, wrap the affected area with an elastic bandage. The bandage applies pressure (compression) to the area, which may help to reduce swelling and promote healing. Do not wrap the bandage too tightly around the affected area.  If your hematoma is on a leg or foot (lower extremity) and is painful, your health care provider may recommend crutches. Use them as told by your health care provider.   General  instructions  Take over-the-counter and prescription medicines only as told by your health care provider.  Keep all follow-up visits as told by your health care provider. This is important. Contact a health care provider if:  You have a fever.  The swelling or discoloration gets worse.  You develop more hematomas. Get help right away if:  Your pain is worse or your pain is not controlled with medicine.  Your skin over the hematoma breaks or starts bleeding.  Your hematoma is in your chest or abdomen and you have weakness, shortness of breath, or a change in consciousness.  You have a hematoma on your scalp that is caused by a fall or injury, and you also have: ? A headache that gets worse. ? Trouble speaking or understanding speech. ? Weakness. ? Change in alertness or consciousness. Summary  A hematoma is a collection of blood under the skin, in an organ, in a body space, in a joint space, or in other tissue.  This condition usually does not need treatment because many hematomas go away on their own over time.  Large hematomas, or those that may affect vital organs, may need surgical drainage or monitoring. If the hematoma is caused by a medical condition, medicines may be prescribed.  Get help right away if your hematoma breaks or starts to bleed, you have shortness of breath, or you have a headache or trouble speaking after a fall. This information is not intended to replace advice given to you by your health care provider. Make sure you discuss any questions you have with your health care provider. Document Revised: 01/09/2019 Document Reviewed: 01/18/2018 Elsevier Patient Education  2021 [employer] Laceration Care, Adult A laceration is a cut that may go through all layers of the skin and into the tissue that is right under the skin. Some lacerations heal on their own. Others need to be closed with stitches (sutures), staples, skin adhesive strips, or skin glue. Proper  care of a laceration reduces the risk for infection, helps the laceration heal better, and may prevent scarring. How to care for your laceration Wash your hands with soap and water before touching your wound or changing your bandage (dressing). If soap and water are not available, use hand sanitizer. Keep the wound clean and dry. If you were given a dressing, you should change it at least once a day, or as told by your health care provider. You should also change it if it becomes wet or dirty. If sutures or staples were used:  Keep the wound completely dry for the first 24 hours, or as told by your health care provider. After that time, you may shower or bathe. However, make sure that the wound is not soaked in water until after the sutures or staples have been removed.  Clean the wound once each day, or as told by your health care provider: ? Wash the wound with soap and water. ?   Rinse the wound with water to remove all soap. ? Pat the wound dry with a clean towel. Do not rub the wound.  After cleaning the wound, apply a thin layer of antibiotic ointment as told by your health care provider. This will help prevent infection and keep the dressing from sticking to the wound.  Have the sutures or staples removed as told by your health care provider. If skin adhesive strips were used:  Do not get the skin adhesive strips wet. You may shower or bathe, but be careful to keep the wound dry.  If the wound gets wet, pat it dry with a clean towel. Do not rub the wound.  Skin adhesive strips fall off on their own. You may trim the strips as the wound heals. Do not remove skin adhesive strips that are still stuck to the wound. They will fall off in time. If skin glue was used:  Try to keep the wound dry, but you may briefly wet it in the shower or bath. Do not soak the wound in water, such as by swimming.  After you have showered or bathed, gently pat the wound dry with a clean towel. Do not rub the  wound.  Do not do any activities that will make you sweat heavily until the skin glue has fallen off on its own.  Do not apply liquid, cream, or ointment medicine to the wound while the skin glue is in place. Using those may loosen the film before the wound has healed.  If a dressing is placed over the wound, be careful not to apply tape directly over the skin glue. Doing that may cause the glue to be pulled off before the wound has healed.  Do not pick at the glue. Skin glue usually remains in place for 5-10 days and then falls off the skin. General instructions  Take over-the-counter and prescription medicines only as told by your health care provider.  If you were prescribed an antibiotic medicine or ointment, take or apply it as told by your health care provider. Do not stop using it even if your condition improves.  Do not scratch or pick at the wound.  Check your wound every day for signs of infection. Watch for: ? Redness, swelling, or pain. ? Fluid, blood, or pus.  Raise (elevate) the injured area above the level of your heart while you are sitting or lying down for the first 24-48 hours after the laceration is repaired.  If directed, put ice on the affected area: ? Put ice in a plastic bag. ? Place a towel between your skin and the bag. ? Leave the ice on for 20 minutes, 2-3 times a day.  Keep all follow-up visits as told by your health care provider. This is important.   Contact a health care provider if:  You received a tetanus shot and you have swelling, severe pain, redness, or bleeding at the injection site.  You have a fever.  A wound that was closed breaks open.  You notice a bad smell coming from your wound or your dressing.  You notice something coming out of the wound, such as wood or glass.  Your pain is not controlled with medicine.  You have increased redness, swelling, or pain at the site of your wound.  You have fluid, blood, or pus coming from  your wound.  You need to change the dressing often due to fluid, blood, or pus that is draining from the wound.    You develop a new rash.  You develop numbness around the wound. Get help right away if:  You develop severe swelling around the wound.  Your pain suddenly increases and is severe.  You develop painful lumps near the wound or on skin anywhere else on your body.  You have a red streak going away from your wound.  The wound is on your hand or foot and you cannot properly move a finger or toe.  The wound is on your hand or foot, and you notice that your fingers or toes look pale or bluish. Summary  A laceration is a cut that may go through all layers of the skin and into the tissue that is right under the skin.  Some lacerations heal on their own. Others need to be closed with stitches (sutures), staples, skin adhesive strips, or skin glue.  Proper care of a laceration reduces the risk of infection, helps the laceration heal better, and prevents scarring. This information is not intended to replace advice given to you by your health care provider. Make sure you discuss any questions you have with your health care provider. Document Revised: 10/13/2017 Document Reviewed: 09/04/2017 Elsevier Patient Education  2021 [employer] Inc[employer]  

## 2021-01-15 NOTE — Progress Notes (Signed)
Acute Office Visit  Subjective:    Patient ID: Erin Khan, female    DOB: [birth year], 80 y.o.   MRN: [id number]  Chief Complaint  Patient presents with  . Fall    HPI Patient is in today for hospital follow up after a fall 2 days ago. She was taken to the ED for evaluation. She fell when stepping off of her porch. She missed a step and landed on her left side. She denied LOC or head injury. She had a large hematoma on her left upper arm following the injury. She had a negative Xray of this arm. She also had superficial bruising and swelling of her left lower leg. She reports that the bruising looks darker on her arm, but the firm area of the hematoma is definitely smaller. She reports less swelling in this arm as well. She reports mild pain in her upper arm with movements at times. Denies numbness or tingling. She has a open shallow laceration over her left bicep. Denies drainage, warmth, or fever. She has had some fluid blister pop up on her lateral upper arm. Denies decreased ROM of her left arm.   Report mild pain and swelling of her left lower leg and ankle. Denies decreased ROM, numbness, or tingling.   Past Medical History:  Diagnosis Date  . Cataract   . Colon polyps   . Hypercholesteremia   . Hypertension     Past Surgical History:  Procedure Laterality Date  . ABDOMINAL HYSTERECTOMY    . BILATERAL OOPHORECTOMY    . CATARACT EXTRACTION, BILATERAL  2014  . CHOLECYSTECTOMY  1980  . COLONOSCOPY    . COLONOSCOPY N/A 05/15/2013   Procedure: COLONOSCOPY;  Surgeon: Rogene Houston, MD;  Location: [facility];  Service: Endoscopy;  Laterality: N/A;  930  . COLONOSCOPY N/A 10/18/2018   Procedure: COLONOSCOPY;  Surgeon: Rogene Houston, MD;  Location: [facility];  Service: Endoscopy;  Laterality: N/A;  830  . cranial shunt     17 years ago  . POLYPECTOMY  10/18/2018   Procedure: POLYPECTOMY;  Surgeon: Rogene Houston, MD;  Location: [facility];  Service: Endoscopy;;   colon    Family History  Problem Relation Age of Onset  . Lung cancer Sister   . Hypertension Father   . Heart attack Brother   . Arthritis Sister   . Asthma Brother   . Healthy Daughter   . Healthy Son   . Colon cancer Neg Hx     Social History   Socioeconomic History  . Marital status: Married    Spouse name: Collins Scotland  . Number of children: 2  . Years of education: 79  . Highest education level: 12th grade  Occupational History  . Occupation: Retired    Comment: Environmental health practitioner  . Occupation: [employer]    Employer: [employer]    Comment: 20 to 25 hours a week part time  Tobacco Use  . Smoking status: Never Smoker  . Smokeless tobacco: Never Used  Vaping Use  . Vaping Use: Never used  Substance and Sexual Activity  . Alcohol use: No  . Drug use: No  . Sexual activity: Yes  Other Topics Concern  . Not on file  Social History Narrative   She lives with her husband, Collins Scotland.   She  has eight grandchildren.   Social Determinants of Health   Financial Resource Strain: Not on file  Food Insecurity: Not on file  Transportation Needs: Not  on file  Physical Activity: Not on file  Stress: Not on file  Social Connections: Not on file  Intimate Partner Violence: Not on file    Outpatient Medications Prior to Visit  Medication Sig Dispense Refill  . BLACK ELDERBERRY PO Take by mouth.    . Calcium Carbonate-Vitamin D (CALCIUM 600+D PO) Take 1 tablet by mouth daily.    . Ergocalciferol (VITAMIN D2) 10 MCG (400 UNIT) TABS Take 400 Units by mouth daily.    Marland Kitchen ezetimibe (ZETIA) 10 MG tablet Take 1 tablet (10 mg total) by mouth daily. 90 tablet 3  . lisinopril (ZESTRIL) 20 MG tablet Take 1 tablet (20 mg total) by mouth daily. 90 tablet 3  . rosuvastatin (CRESTOR) 10 MG tablet Take 1 tablet (10 mg total) by mouth daily. 90 tablet 3  . vitamin B-12 (CYANOCOBALAMIN) 500 MCG tablet Take 500 mcg by mouth daily.      No facility-administered medications prior to visit.     Allergies  Allergen Reactions  . Asa [Aspirin] Hives and Swelling  . Statins     "made me feel funny"    Review of Systems As per HPI.     Objective:    Physical Exam Vitals and nursing note reviewed.  Constitutional:      General: She is not in acute distress.    Appearance: She is not ill-appearing, toxic-appearing or diaphoretic.  Pulmonary:     Effort: Pulmonary effort is normal. No respiratory distress.  Musculoskeletal:        General: Signs of injury present.     Right lower leg: No edema.     Left lower leg: No edema.     Comments: Large hematoma to left upper arm. Significant bruising to left arm. Fluid blisters present and shallow skin tear present to lateral portion of upper arm. No signs of infection. Full ROM. Sensation intact.   Superficial bruising with mild swelling to left lower leg. No signs of infection. No open wounds. Full  ROM. Sensation intact.   Skin:    General: Skin is warm and dry.     Capillary Refill: Capillary refill takes less than 2 seconds.  Neurological:     General: No focal deficit present.     Mental Status: She is alert and oriented to person, place, and time.  Psychiatric:        Mood and Affect: Mood normal.        Behavior: Behavior normal.     BP (!) 159/78   Pulse 82   Temp 97.8 F (36.6 C) (Temporal)   Ht _0  (1.676 m)   Wt 153 lb 6 oz (69.6 kg)   BMI 24.76 kg/m  Wt Readings from Last 3 Encounters:  01/15/21 153 lb 6 oz (69.6 kg)  12/02/20 153 lb 9.6 oz (69.7 kg)  06/09/20 156 lb (70.8 kg)    There are no preventive care reminders to display for this patient.  There are no preventive care reminders to display for this patient.   Lab Results  Component Value Date   TSH 0.608 12/02/2020   Lab Results  Component Value Date   WBC 6.6 12/02/2020   HGB 12.9 12/02/2020   HCT 38.6 12/02/2020   MCV 93 12/02/2020   PLT 192 12/02/2020   Lab Results  Component Value Date   NA 140 12/02/2020   K 4.1  12/02/2020   CO2 20 12/02/2020   GLUCOSE 89 12/02/2020   BUN 27 12/02/2020  CREATININE 1.21 (H) 12/02/2020   BILITOT 0.5 12/02/2020   ALKPHOS 79 12/02/2020   AST 17 12/02/2020   ALT 15 12/02/2020   PROT 6.9 12/02/2020   ALBUMIN 4.2 12/02/2020   CALCIUM 9.4 12/02/2020   EGFR 46 (L) 12/02/2020   Lab Results  Component Value Date   CHOL 215 (H) 12/02/2020   Lab Results  Component Value Date   HDL 42 12/02/2020   Lab Results  Component Value Date   LDLCALC 130 (H) 12/02/2020   Lab Results  Component Value Date   TRIG 242 (H) 12/02/2020   Lab Results  Component Value Date   CHOLHDL 5.1 (H) 12/02/2020   No results found for: HGBA1C     Assessment & Plan:   Sherby was seen today for fall.  Diagnoses and all orders for this visit:  Traumatic hematoma of left upper arm, subsequent encounter Reports improving symptoms. Lasix given as fluid blisters have developed. Skin tear cleansed and dressed. No signs of infection or compartment syndrome. Discussed rest, heat, ice, elevation. Follow up in 1 week, sooner for new or worsening symptoms.  -     furosemide (LASIX) 20 MG tablet; Take 1 tablet (20 mg total) by mouth daily.  Contusion of left lower leg, subsequent encounter Improving. Rest, heat, ice, elevation.   Hospital discharge follow-up Reviewed ED record.   Return in about 1 week (around 01/22/2021) for hematoma follow up.  The patient indicates understanding of these issues and agrees with the plan.  Gwenlyn Perking, FNP

## 2021-01-22 ENCOUNTER — Ambulatory Visit (INDEPENDENT_AMBULATORY_CARE_PROVIDER_SITE_OTHER): Payer: Medicare Other | Admitting: Family Medicine

## 2021-01-22 ENCOUNTER — Encounter: Payer: Self-pay | Admitting: Family Medicine

## 2021-01-22 ENCOUNTER — Other Ambulatory Visit: Payer: Self-pay

## 2021-01-22 VITALS — BP 156/75 | HR 80 | Temp 96.5°F | Ht 66.0 in | Wt 153.5 lb

## 2021-01-22 DIAGNOSIS — S40022D Contusion of left upper arm, subsequent encounter: Secondary | ICD-10-CM

## 2021-01-22 DIAGNOSIS — S8012XD Contusion of left lower leg, subsequent encounter: Secondary | ICD-10-CM

## 2021-01-22 NOTE — Patient Instructions (Signed)
Hematoma A hematoma is a collection of blood under the skin, in an organ, in a body space, in a joint space, or in other tissue. The blood can thicken (clot) to form a lump that you can see and feel. The lump is often firm and may become sore and tender. Most hematomas get better in a few days to weeks. However, some hematomas may be serious and require medical care. Hematomas can range from very small to very large. What are the causes? This condition is caused by:  A blunt or penetrating injury.  A leakage from a blood vessel under the skin.  Some medical procedures, including surgeries, such as oral surgery, face lifts, and surgeries on the joints.  Some medical conditions that cause bleeding or bruising. There may be multiple hematomas that appear in different areas of the body. What increases the risk? You are more likely to develop this condition if:  You are an older adult.  You use blood thinners. What are the signs or symptoms? Symptoms of this condition depend on where the hematoma is located.  Common symptoms of a hematoma that is under the skin include:  A firm lump on the body.  Pain and tenderness in the area.  Bruising. Blue, dark blue, purple-red, or yellowish skin (discoloration) may appear at the site of the hematoma if the hematoma is close to the surface of the skin. Common symptoms of a hematoma that is deep in the tissues or body spaces may be less obvious. They include:  A collection of blood in the stomach (intra-abdominal hematoma). This may cause pain in the abdomen, weakness, fainting, and shortness of breath.  A collection of blood in the head (intracranial hematoma). This may cause a headache or symptoms such as weakness, trouble speaking or understanding, or a change in consciousness.   How is this diagnosed? This condition is diagnosed based on:  Your medical history.  A physical exam.  Imaging tests, such as an ultrasound or CT scan. These may  be needed if your health care provider suspects a hematoma in deeper tissues or body spaces.  Blood tests. These may be needed if your health care provider believes that the hematoma is caused by a medical condition. How is this treated? Treatment for this condition depends on the cause, size, and location of the hematoma. Treatment may include:  Doing nothing. The majority of hematomas do not need treatment as many of them go away on their own over time.  Surgery or close monitoring. This may be needed for large hematomas or hematomas that affect vital organs.  Medicines. Medicines may be given if there is an underlying medical cause for the hematoma. Follow these instructions at home: Managing pain, stiffness, and swelling  If directed, put ice on the affected area. ? Put ice in a plastic bag. ? Place a towel between your skin and the bag. ? Leave the ice on for 20 minutes, 2-3 times a day for the first couple of days.  If directed, apply heat to the affected area after applying ice for a couple of days. Use the heat source that your health care provider recommends, such as a moist heat pack or a heating pad. ? Place a towel between your skin and the heat source. ? Leave the heat on for 20-30 minutes. ? Remove the heat if your skin turns bright red. This is especially important if you are unable to feel pain, heat, or cold. You may have a greater  risk of getting burned.  Raise (elevate) the affected area above the level of your heart while you are sitting or lying down.  If told, wrap the affected area with an elastic bandage. The bandage applies pressure (compression) to the area, which may help to reduce swelling and promote healing. Do not wrap the bandage too tightly around the affected area.  If your hematoma is on a leg or foot (lower extremity) and is painful, your health care provider may recommend crutches. Use them as told by your health care provider.   General  instructions  Take over-the-counter and prescription medicines only as told by your health care provider.  Keep all follow-up visits as told by your health care provider. This is important. Contact a health care provider if:  You have a fever.  The swelling or discoloration gets worse.  You develop more hematomas. Get help right away if:  Your pain is worse or your pain is not controlled with medicine.  Your skin over the hematoma breaks or starts bleeding.  Your hematoma is in your chest or abdomen and you have weakness, shortness of breath, or a change in consciousness.  You have a hematoma on your scalp that is caused by a fall or injury, and you also have: ? A headache that gets worse. ? Trouble speaking or understanding speech. ? Weakness. ? Change in alertness or consciousness. Summary  A hematoma is a collection of blood under the skin, in an organ, in a body space, in a joint space, or in other tissue.  This condition usually does not need treatment because many hematomas go away on their own over time.  Large hematomas, or those that may affect vital organs, may need surgical drainage or monitoring. If the hematoma is caused by a medical condition, medicines may be prescribed.  Get help right away if your hematoma breaks or starts to bleed, you have shortness of breath, or you have a headache or trouble speaking after a fall. This information is not intended to replace advice given to you by your health care provider. Make sure you discuss any questions you have with your health care provider. Document Revised: 01/09/2019 Document Reviewed: 01/18/2018 Elsevier Patient Education  2021 [employer], Adult A laceration is a cut that may go through all layers of the skin and into the tissue that is right under the skin. Some lacerations heal on their own. Others need to be closed with stitches (sutures), staples, skin adhesive strips, or skin glue. Proper  care of a laceration reduces the risk for infection, helps the laceration heal better, and may prevent scarring. How to care for your laceration Wash your hands with soap and water before touching your wound or changing your bandage (dressing). If soap and water are not available, use hand sanitizer. Keep the wound clean and dry. If you were given a dressing, you should change it at least once a day, or as told by your health care provider. You should also change it if it becomes wet or dirty. If sutures or staples were used:  Keep the wound completely dry for the first 24 hours, or as told by your health care provider. After that time, you may shower or bathe. However, make sure that the wound is not soaked in water until after the sutures or staples have been removed.  Clean the wound once each day, or as told by your health care provider: ? Wash the wound with soap and water. ?  Rinse the wound with water to remove all soap. ? Pat the wound dry with a clean towel. Do not rub the wound.  After cleaning the wound, apply a thin layer of antibiotic ointment as told by your health care provider. This will help prevent infection and keep the dressing from sticking to the wound.  Have the sutures or staples removed as told by your health care provider. If skin adhesive strips were used:  Do not get the skin adhesive strips wet. You may shower or bathe, but be careful to keep the wound dry.  If the wound gets wet, pat it dry with a clean towel. Do not rub the wound.  Skin adhesive strips fall off on their own. You may trim the strips as the wound heals. Do not remove skin adhesive strips that are still stuck to the wound. They will fall off in time. If skin glue was used:  Try to keep the wound dry, but you may briefly wet it in the shower or bath. Do not soak the wound in water, such as by swimming.  After you have showered or bathed, gently pat the wound dry with a clean towel. Do not rub the  wound.  Do not do any activities that will make you sweat heavily until the skin glue has fallen off on its own.  Do not apply liquid, cream, or ointment medicine to the wound while the skin glue is in place. Using those may loosen the film before the wound has healed.  If a dressing is placed over the wound, be careful not to apply tape directly over the skin glue. Doing that may cause the glue to be pulled off before the wound has healed.  Do not pick at the glue. Skin glue usually remains in place for 5-10 days and then falls off the skin. General instructions  Take over-the-counter and prescription medicines only as told by your health care provider.  If you were prescribed an antibiotic medicine or ointment, take or apply it as told by your health care provider. Do not stop using it even if your condition improves.  Do not scratch or pick at the wound.  Check your wound every day for signs of infection. Watch for: ? Redness, swelling, or pain. ? Fluid, blood, or pus.  Raise (elevate) the injured area above the level of your heart while you are sitting or lying down for the first 24-48 hours after the laceration is repaired.  If directed, put ice on the affected area: ? Put ice in a plastic bag. ? Place a towel between your skin and the bag. ? Leave the ice on for 20 minutes, 2-3 times a day.  Keep all follow-up visits as told by your health care provider. This is important.   Contact a health care provider if:  You received a tetanus shot and you have swelling, severe pain, redness, or bleeding at the injection site.  You have a fever.  A wound that was closed breaks open.  You notice a bad smell coming from your wound or your dressing.  You notice something coming out of the wound, such as wood or glass.  Your pain is not controlled with medicine.  You have increased redness, swelling, or pain at the site of your wound.  You have fluid, blood, or pus coming from  your wound.  You need to change the dressing often due to fluid, blood, or pus that is draining from the wound.  You develop a new rash.  You develop numbness around the wound. Get help right away if:  You develop severe swelling around the wound.  Your pain suddenly increases and is severe.  You develop painful lumps near the wound or on skin anywhere else on your body.  You have a red streak going away from your wound.  The wound is on your hand or foot and you cannot properly move a finger or toe.  The wound is on your hand or foot, and you notice that your fingers or toes look pale or bluish. Summary  A laceration is a cut that may go through all layers of the skin and into the tissue that is right under the skin.  Some lacerations heal on their own. Others need to be closed with stitches (sutures), staples, skin adhesive strips, or skin glue.  Proper care of a laceration reduces the risk of infection, helps the laceration heal better, and prevents scarring. This information is not intended to replace advice given to you by your health care provider. Make sure you discuss any questions you have with your health care provider. Document Revised: 10/13/2017 Document Reviewed: 09/04/2017 Elsevier Patient Education  2021 [employer].

## 2021-01-22 NOTE — Progress Notes (Signed)
Established Patient Office Visit  Subjective:  Patient ID: Erin Khan, female    DOB: 10-14-1940  Age: 80 y.o. MRN: [id number]  CC:  Chief Complaint  Patient presents with  . hematoma    HPI Erin Khan presents for follow up of a traumatic hematoma of her left upper arm and a contusion of her left ankle following a fall. She report the hematoma is improving. They have been dressing the skin tear daily. Blisters have resolved. Denies numbness, tingling, or decreased ROM. Denies fever, drainage, warmth, or tenderness. She does report mild intermittent pain in her left ankle. There is still bruising present to her left lower leg and intermittent mild swelling.   Past Medical History:  Diagnosis Date  . Cataract   . Colon polyps   . Hypercholesteremia   . Hypertension     Past Surgical History:  Procedure Laterality Date  . ABDOMINAL HYSTERECTOMY    . BILATERAL OOPHORECTOMY    . CATARACT EXTRACTION, BILATERAL  2014  . CHOLECYSTECTOMY  1980  . COLONOSCOPY    . COLONOSCOPY N/A 05/15/2013   Procedure: COLONOSCOPY;  Surgeon: Erin Houston, MD;  Location: [facility];  Service: Endoscopy;  Laterality: N/A;  930  . COLONOSCOPY N/A 10/18/2018   Procedure: COLONOSCOPY;  Surgeon: Erin Houston, MD;  Location: [facility];  Service: Endoscopy;  Laterality: N/A;  830  . cranial shunt     17 years ago  . POLYPECTOMY  10/18/2018   Procedure: POLYPECTOMY;  Surgeon: Erin Houston, MD;  Location: [facility];  Service: Endoscopy;;  colon    Family History  Problem Relation Age of Onset  . Lung cancer Sister   . Hypertension Father   . Heart attack Brother   . Arthritis Sister   . Asthma Brother   . Healthy Daughter   . Healthy Son   . Colon cancer Neg Hx     Social History   Socioeconomic History  . Marital status: Married    Spouse name: Erin Khan  . Number of children: 2  . Years of education: 84  . Highest education level: 12th grade  Occupational History  .  Occupation: Retired    Comment: Environmental health practitioner  . Occupation: [employer]    Employer: [employer]    Comment: 20 to 25 hours a week part time  Tobacco Use  . Smoking status: Never Smoker  . Smokeless tobacco: Never Used  Vaping Use  . Vaping Use: Never used  Substance and Sexual Activity  . Alcohol use: No  . Drug use: No  . Sexual activity: Yes  Other Topics Concern  . Not on file  Social History Narrative   She lives with her husband, Erin Khan.   She  has eight grandchildren.   Social Determinants of Health   Financial Resource Strain: Not on file  Food Insecurity: Not on file  Transportation Needs: Not on file  Physical Activity: Not on file  Stress: Not on file  Social Connections: Not on file  Intimate Partner Violence: Not on file    Outpatient Medications Prior to Visit  Medication Sig Dispense Refill  . BLACK ELDERBERRY PO Take by mouth.    . Calcium Carbonate-Vitamin D (CALCIUM 600+D PO) Take 1 tablet by mouth daily.    . Ergocalciferol (VITAMIN D2) 10 MCG (400 UNIT) TABS Take 400 Units by mouth daily.    Marland Kitchen ezetimibe (ZETIA) 10 MG tablet Take 1 tablet (10 mg total) by mouth daily.  90 tablet 3  . furosemide (LASIX) 20 MG tablet Take 1 tablet (20 mg total) by mouth daily. 3 tablet 0  . lisinopril (ZESTRIL) 20 MG tablet Take 1 tablet (20 mg total) by mouth daily. 90 tablet 3  . rosuvastatin (CRESTOR) 10 MG tablet Take 1 tablet (10 mg total) by mouth daily. 90 tablet 3  . vitamin B-12 (CYANOCOBALAMIN) 500 MCG tablet Take 500 mcg by mouth daily.      No facility-administered medications prior to visit.    Allergies  Allergen Reactions  . Asa [Aspirin] Hives and Swelling  . Statins     "made me feel funny"    ROS Review of Systems As per HPI.    Objective:    Physical Exam Vitals and nursing note reviewed.  Constitutional:      General: She is not in acute distress.    Appearance: She is not ill-appearing, toxic-appearing or diaphoretic.  Pulmonary:      Effort: Pulmonary effort is normal. No respiratory distress.  Musculoskeletal:        General: Signs of injury present.     Right lower leg: No edema.     Left lower leg: No edema.     Comments: Hematoma to left upper arm that has decreased in size in comparison to last week. Significant bruising to left arm. Skin tear present with no signs of infection. Full ROM. Sensation intact.   Superficial bruising with mild swelling to left lower leg and ankle. Tenderness over contusion, no bony tenderness. .   Skin:    General: Skin is warm and dry.     Capillary Refill: Capillary refill takes less than 2 seconds.  Neurological:     General: No focal deficit present.     Mental Status: She is alert and oriented to person, place, and time.  Psychiatric:        Mood and Affect: Mood normal.        Behavior: Behavior normal.     BP (!) 156/75   Pulse 80   Temp (!) 96.5 F (35.8 C) (Temporal)   Ht '5\' 6"'  (1.676 m)   Wt 153 lb 8 oz (69.6 kg)   BMI 24.78 kg/m  Wt Readings from Last 3 Encounters:  01/22/21 153 lb 8 oz (69.6 kg)  01/15/21 153 lb 6 oz (69.6 kg)  12/02/20 153 lb 9.6 oz (69.7 kg)     Health Maintenance Due  Topic Date Due  . Zoster Vaccines- Shingrix (1 of 2) Never done    There are no preventive care reminders to display for this patient.  Lab Results  Component Value Date   TSH 0.608 12/02/2020   Lab Results  Component Value Date   WBC 6.6 12/02/2020   HGB 12.9 12/02/2020   HCT 38.6 12/02/2020   MCV 93 12/02/2020   PLT 192 12/02/2020   Lab Results  Component Value Date   NA 140 12/02/2020   K 4.1 12/02/2020   CO2 20 12/02/2020   GLUCOSE 89 12/02/2020   BUN 27 12/02/2020   CREATININE 1.21 (H) 12/02/2020   BILITOT 0.5 12/02/2020   ALKPHOS 79 12/02/2020   AST 17 12/02/2020   ALT 15 12/02/2020   PROT 6.9 12/02/2020   ALBUMIN 4.2 12/02/2020   CALCIUM 9.4 12/02/2020   EGFR 46 (L) 12/02/2020   Lab Results  Component Value Date   CHOL 215 (H) 12/02/2020    Lab Results  Component Value Date   HDL 42 12/02/2020  Lab Results  Component Value Date   LDLCALC 130 (H) 12/02/2020   Lab Results  Component Value Date   TRIG 242 (H) 12/02/2020   Lab Results  Component Value Date   CHOLHDL 5.1 (H) 12/02/2020   No results found for: HGBA1C    Assessment & Plan:   [place] was seen today for hematoma.  Diagnoses and all orders for this visit:  Traumatic hematoma of left upper arm, subsequent encounter Improving symptoms. Skin tear cleansed and dressed. No signs of infection. No concern for compartment syndrome. Rest, ice, heat, elevation.   Contusion of left lower leg, subsequent encounter Improving. Rest, heat, ice, compression, elevation. Tylenol as needed for pain.    Follow-up: Return in about 2 weeks (around 02/05/2021) for wound recheck.   The patient indicates understanding of these issues and agrees with the plan.  Gwenlyn Perking, FNP

## 2021-01-26 DIAGNOSIS — Z960 Presence of urogenital implants: Secondary | ICD-10-CM | POA: Diagnosis not present

## 2021-02-09 ENCOUNTER — Other Ambulatory Visit: Payer: Self-pay

## 2021-02-09 ENCOUNTER — Ambulatory Visit (INDEPENDENT_AMBULATORY_CARE_PROVIDER_SITE_OTHER): Payer: Medicare Other | Admitting: Family

## 2021-02-09 ENCOUNTER — Encounter: Payer: Self-pay | Admitting: Family

## 2021-02-09 VITALS — BP 196/83 | HR 80 | Temp 97.9°F | Ht 66.0 in | Wt 152.6 lb

## 2021-02-09 DIAGNOSIS — Y92009 Unspecified place in unspecified non-institutional (private) residence as the place of occurrence of the external cause: Secondary | ICD-10-CM

## 2021-02-09 DIAGNOSIS — S40022D Contusion of left upper arm, subsequent encounter: Secondary | ICD-10-CM | POA: Diagnosis not present

## 2021-02-09 DIAGNOSIS — W19XXXS Unspecified fall, sequela: Secondary | ICD-10-CM

## 2021-02-09 NOTE — Progress Notes (Signed)
   Subjective:    Patient ID: Erin Khan, female    DOB: 1941/08/21, 80 y.o.   MRN: [id number]  Chief Complaint  Patient presents with   Fall    2 [place]     Fall  Pt presents to the office today for follow up on hematoma. She reports she fell several weeks ago. She reports she was hanging clothes and lost her balance and fell and hit her left shoulder.   She has a large hematoma on her left upper arm that is still present. She reports the discoloration has spread down her arm in the last few days.   She denies any pain at this time. She has full ROM of her left shoulder.    Review of Systems     Objective:   Physical Exam Vitals reviewed.  Constitutional:      General: She is not in acute distress.    Appearance: She is well-developed.  HENT:     Head: Normocephalic and atraumatic.  Eyes:     Pupils: Pupils are equal, round, and reactive to light.  Neck:     Thyroid: No thyromegaly.  Cardiovascular:     Rate and Rhythm: Normal rate and regular rhythm.     Heart sounds: Normal heart sounds. No murmur heard. Pulmonary:     Effort: Pulmonary effort is normal. No respiratory distress.     Breath sounds: Normal breath sounds. No wheezing.  Abdominal:     General: Bowel sounds are normal. There is no distension.     Palpations: Abdomen is soft.     Tenderness: There is no abdominal tenderness.  Musculoskeletal:        General: No tenderness. Normal range of motion.     Cervical back: Normal range of motion and neck supple.  Skin:    General: Skin is warm and dry.     Comments: Large hematoma present on left upper arm approx 13X12, ecchymosis present   Neurological:     Mental Status: She is alert and oriented to person, place, and time.     Cranial Nerves: No cranial nerve deficit.     Deep Tendon Reflexes: Reflexes are normal and symmetric.  Psychiatric:        Behavior: Behavior normal.        Thought Content: Thought content normal.         Judgment: Judgment normal.            BP (!) 196/83   Pulse 80   Temp 97.9 F (36.6 C) (Temporal)   Ht 5\' 6"  (1.676 m)   Wt 152 lb 9.6 oz (69.2 kg)   BMI 24.63 kg/m   Assessment & Plan:   Erin Khan comes in today with chief complaint of Fall (2 WEEK FOLLOW UP FROM FALL )   Diagnosis and orders addressed:  1. Traumatic hematoma of left upper arm, subsequent encounter  2. Fall in home, sequela  Improving Avoid reinjury Rest Ice as needed, NO heat Elevate when possible    Evelina Dun, FNP

## 2021-02-09 NOTE — Patient Instructions (Signed)
Hematoma A hematoma is a collection of blood under the skin, in an organ, in a body space, in a joint space, or in other tissue. The blood can thicken (clot) to form a lump that you can see and feel. The lump is often firm and may become sore and tender. Most hematomas get better in a few days to weeks. However, some hematomas may be serious and require medical care. Hematomas canrange from very small to very large. What are the causes? This condition is caused by: A blunt or penetrating injury. A leakage from a blood vessel under the skin. Some medical procedures, including surgeries, such as oral surgery, face lifts, and surgeries on the joints. Some medical conditions that cause bleeding or bruising. There may be multiple hematomas that appear in different areas of the body. What increases the risk? You are more likely to develop this condition if: You are an older adult. You use blood thinners. What are the signs or symptoms?  Symptoms of this condition depend on where the hematoma is located.  Common symptoms of a hematoma that is under the skin include: A firm lump on the body. Pain and tenderness in the area. Bruising. Blue, dark blue, purple-red, or yellowish skin (discoloration) may appear at the site of the hematoma if the hematoma is close to the surface of the skin. Common symptoms of a hematoma that is deep in the tissues or body spaces may be less obvious. They include: A collection of blood in the stomach (intra-abdominal hematoma). This may cause pain in the abdomen, weakness, fainting, and shortness of breath. A collection of blood in the head (intracranial hematoma). This may cause a headache or symptoms such as weakness, trouble speaking or understanding, or a change in consciousness.  How is this diagnosed? This condition is diagnosed based on: Your medical history. A physical exam. Imaging tests, such as an ultrasound or CT scan. These may be needed if your health care  provider suspects a hematoma in deeper tissues or body spaces. Blood tests. These may be needed if your health care provider believes that the hematoma is caused by a medical condition. How is this treated? Treatment for this condition depends on the cause, size, and location of the hematoma. Treatment may include: Doing nothing. The majority of hematomas do not need treatment as many of them go away on their own over time. Surgery or close monitoring. This may be needed for large hematomas or hematomas that affect vital organs. Medicines. Medicines may be given if there is an underlying medical cause for the hematoma. Follow these instructions at home: Managing pain, stiffness, and swelling  If directed, put ice on the affected area. Put ice in a plastic bag. Place a towel between your skin and the bag. Leave the ice on for 20 minutes, 2-3 times a day for the first couple of days. If directed, apply heat to the affected area after applying ice for a couple of days. Use the heat source that your health care provider recommends, such as a moist heat pack or a heating pad. Place a towel between your skin and the heat source. Leave the heat on for 20-30 minutes. Remove the heat if your skin turns bright red. This is especially important if you are unable to feel pain, heat, or cold. You may have a greater risk of getting burned. Raise (elevate) the affected area above the level of your heart while you are sitting or lying down. If told, wrap the  affected area with an elastic bandage. The bandage applies pressure (compression) to the area, which may help to reduce swelling and promote healing. Do not wrap the bandage too tightly around the affected area. If your hematoma is on a leg or foot (lower extremity) and is painful, your health care provider may recommend crutches. Use them as told by your health care provider.  General instructions Take over-the-counter and prescription medicines only as  told by your health care provider. Keep all follow-up visits as told by your health care provider. This is important. Contact a health care provider if: You have a fever. The swelling or discoloration gets worse. You develop more hematomas. Get help right away if: Your pain is worse or your pain is not controlled with medicine. Your skin over the hematoma breaks or starts bleeding. Your hematoma is in your chest or abdomen and you have weakness, shortness of breath, or a change in consciousness. You have a hematoma on your scalp that is caused by a fall or injury, and you also have: A headache that gets worse. Trouble speaking or understanding speech. Weakness. Change in alertness or consciousness. Summary A hematoma is a collection of blood under the skin, in an organ, in a body space, in a joint space, or in other tissue. This condition usually does not need treatment because many hematomas go away on their own over time. Large hematomas, or those that may affect vital organs, may need surgical drainage or monitoring. If the hematoma is caused by a medical condition, medicines may be prescribed. Get help right away if your hematoma breaks or starts to bleed, you have shortness of breath, or you have a headache or trouble speaking after a fall. This information is not intended to replace advice given to you by your health care provider. Make sure you discuss any questions you have with your healthcare provider. Document Revised: 01/09/2019 Document Reviewed: 01/18/2018 Elsevier Patient Education  2022 [employer].

## 2021-03-04 DIAGNOSIS — Z960 Presence of urogenital implants: Secondary | ICD-10-CM | POA: Diagnosis not present

## 2021-03-12 DIAGNOSIS — M25512 Pain in left shoulder: Secondary | ICD-10-CM | POA: Diagnosis not present

## 2021-03-12 DIAGNOSIS — M79605 Pain in left leg: Secondary | ICD-10-CM | POA: Diagnosis not present

## 2021-05-12 ENCOUNTER — Other Ambulatory Visit (HOSPITAL_COMMUNITY): Payer: Self-pay | Admitting: Obstetrics and Gynecology

## 2021-05-12 DIAGNOSIS — Z1231 Encounter for screening mammogram for malignant neoplasm of breast: Secondary | ICD-10-CM

## 2021-05-13 DIAGNOSIS — Z08 Encounter for follow-up examination after completed treatment for malignant neoplasm: Secondary | ICD-10-CM | POA: Diagnosis not present

## 2021-05-13 DIAGNOSIS — C44319 Basal cell carcinoma of skin of other parts of face: Secondary | ICD-10-CM | POA: Diagnosis not present

## 2021-05-13 DIAGNOSIS — Z85828 Personal history of other malignant neoplasm of skin: Secondary | ICD-10-CM | POA: Diagnosis not present

## 2021-05-13 DIAGNOSIS — L720 Epidermal cyst: Secondary | ICD-10-CM | POA: Diagnosis not present

## 2021-05-13 DIAGNOSIS — L821 Other seborrheic keratosis: Secondary | ICD-10-CM | POA: Diagnosis not present

## 2021-06-04 DIAGNOSIS — Z960 Presence of urogenital implants: Secondary | ICD-10-CM | POA: Diagnosis not present

## 2021-06-04 DIAGNOSIS — R5383 Other fatigue: Secondary | ICD-10-CM | POA: Diagnosis not present

## 2021-06-09 ENCOUNTER — Telehealth: Payer: Self-pay | Admitting: Family

## 2021-06-10 NOTE — Telephone Encounter (Signed)
Patient aware and verbalized understanding. °

## 2021-06-10 NOTE — Telephone Encounter (Signed)
I believe so, but I think it is currently scanning into Epic.

## 2021-06-15 ENCOUNTER — Ambulatory Visit (INDEPENDENT_AMBULATORY_CARE_PROVIDER_SITE_OTHER): Payer: Medicare Other

## 2021-06-15 ENCOUNTER — Encounter: Payer: Self-pay | Admitting: Family

## 2021-06-15 ENCOUNTER — Ambulatory Visit (INDEPENDENT_AMBULATORY_CARE_PROVIDER_SITE_OTHER): Payer: Medicare Other | Admitting: Family

## 2021-06-15 ENCOUNTER — Other Ambulatory Visit: Payer: Self-pay

## 2021-06-15 VITALS — BP 184/94 | HR 77 | Temp 97.9°F | Ht 66.0 in | Wt 151.6 lb

## 2021-06-15 DIAGNOSIS — R2689 Other abnormalities of gait and mobility: Secondary | ICD-10-CM | POA: Diagnosis not present

## 2021-06-15 DIAGNOSIS — M810 Age-related osteoporosis without current pathological fracture: Secondary | ICD-10-CM

## 2021-06-15 DIAGNOSIS — N183 Chronic kidney disease, stage 3 unspecified: Secondary | ICD-10-CM | POA: Diagnosis not present

## 2021-06-15 DIAGNOSIS — N819 Female genital prolapse, unspecified: Secondary | ICD-10-CM | POA: Diagnosis not present

## 2021-06-15 DIAGNOSIS — I10 Essential (primary) hypertension: Secondary | ICD-10-CM

## 2021-06-15 DIAGNOSIS — R531 Weakness: Secondary | ICD-10-CM

## 2021-06-15 DIAGNOSIS — E785 Hyperlipidemia, unspecified: Secondary | ICD-10-CM | POA: Diagnosis not present

## 2021-06-15 NOTE — Patient Instructions (Signed)

## 2021-06-15 NOTE — Progress Notes (Signed)
Subjective:    Patient ID: Erin Khan, female    DOB: 02-22-1941, 80 y.o.   MRN: [id number]  Chief Complaint  Patient presents with   Medical Management of Chronic Issues   PT presents to the office today for CPE. She is followed by GYN annually. She is followed by Urologists every 3 months vaginal prolapse and pessary maintenance.   She is followed by ENT for dizziness.    She does report fatigue that she has noticed over the last 8 months. She states this is unchanged. She has a full panel of lab work on 12/02/20 that was stable.   She is complaining of gait issues and feels unsteady.  Hypertension This is a chronic problem. The current episode started more than 1 year ago. The problem has been waxing and waning since onset. The problem is uncontrolled. Associated symptoms include malaise/fatigue. Pertinent negatives include no peripheral edema or shortness of breath. Risk factors for coronary artery disease include dyslipidemia and sedentary lifestyle. Past treatments include ACE inhibitors. The current treatment provides mild improvement.  Hyperlipidemia This is a chronic problem. The current episode started more than 1 year ago. Pertinent negatives include no shortness of breath. Current antihyperlipidemic treatment includes statins. The current treatment provides moderate improvement of lipids. Risk factors for coronary artery disease include dyslipidemia, hypertension, a sedentary lifestyle and post-menopausal.  Osteoporosis  Taking calcium and vit D. Does not do any weight bearing exercises.    Review of Systems  Constitutional:  Positive for malaise/fatigue.  Respiratory:  Negative for shortness of breath.   All other systems reviewed and are negative.     Objective:   Physical Exam Vitals reviewed.  Constitutional:      General: She is not in acute distress.    Appearance: She is well-developed.  HENT:     Head: Normocephalic and atraumatic.     Right Ear: Tympanic  membrane normal.     Left Ear: Tympanic membrane normal.  Eyes:     Pupils: Pupils are equal, round, and reactive to light.  Neck:     Thyroid: No thyromegaly.  Cardiovascular:     Rate and Rhythm: Normal rate and regular rhythm.     Heart sounds: Normal heart sounds. No murmur heard. Pulmonary:     Effort: Pulmonary effort is normal. No respiratory distress.     Breath sounds: Normal breath sounds. No wheezing.  Abdominal:     General: Bowel sounds are normal. There is no distension.     Palpations: Abdomen is soft.     Tenderness: There is no abdominal tenderness.  Musculoskeletal:        General: No tenderness. Normal range of motion.     Cervical back: Normal range of motion and neck supple.  Skin:    General: Skin is warm and dry.  Neurological:     Mental Status: She is alert and oriented to person, place, and time.     Cranial Nerves: No cranial nerve deficit.     Deep Tendon Reflexes: Reflexes are normal and symmetric.  Psychiatric:        Behavior: Behavior normal.        Thought Content: Thought content normal.        Judgment: Judgment normal.      BP (!) 184/94   Pulse 77   Temp 97.9 F (36.6 C) (Temporal)   Ht '5\' 6"'  (1.676 m)   Wt 151 lb 9.6 oz (68.8 kg)   BMI 24.47  kg/m      Assessment & Plan:  Erin Khan comes in today with chief complaint of Medical Management of Chronic Issues   Diagnosis and orders addressed:  1. Primary hypertension PT will check BP at home and call and let us know  If still elevated will need to change his medications.  - CMP14+EGFR  2. Stage 3 chronic kidney disease, unspecified whether stage 3a or 3b CKD (HCC) - CMP14+EGFR  3. Vaginal vault prolapse - CMP14+EGFR  4. Hyperlipidemia, unspecified hyperlipidemia type  - CMP14+EGFR  5. Age-related osteoporosis without current pathological fracture - CMP14+EGFR - DG [facility] DEXA  6. Balance problem Referral to PT to see if they can help.  - Ambulatory referral to  Physical Therapy  7. Weakness - Ambulatory referral to Physical Therapy   Labs pending Health Maintenance reviewed Diet and exercise encouraged  Follow up plan: 6 months    Evelina Dun, FNP

## 2021-06-16 DIAGNOSIS — M8588 Other specified disorders of bone density and structure, other site: Secondary | ICD-10-CM | POA: Diagnosis not present

## 2021-06-16 DIAGNOSIS — Z780 Asymptomatic menopausal state: Secondary | ICD-10-CM | POA: Diagnosis not present

## 2021-06-16 DIAGNOSIS — M810 Age-related osteoporosis without current pathological fracture: Secondary | ICD-10-CM | POA: Diagnosis not present

## 2021-06-16 LAB — CMP14+EGFR
ALT: 12 IU/L (ref 0–32)
AST: 13 IU/L (ref 0–40)
Albumin/Globulin Ratio: 1.6 (ref 1.2–2.2)
Albumin: 4.3 g/dL (ref 3.7–4.7)
Alkaline Phosphatase: 85 IU/L (ref 44–121)
BUN/Creatinine Ratio: 22 (ref 12–28)
BUN: 29 mg/dL — ABNORMAL HIGH (ref 8–27)
Bilirubin Total: 0.3 mg/dL (ref 0.0–1.2)
CO2: 23 mmol/L (ref 20–29)
Calcium: 9.5 mg/dL (ref 8.7–10.3)
Chloride: 106 mmol/L (ref 96–106)
Creatinine, Ser: 1.34 mg/dL — ABNORMAL HIGH (ref 0.57–1.00)
Globulin, Total: 2.7 g/dL (ref 1.5–4.5)
Glucose: 93 mg/dL (ref 70–99)
Potassium: 4.8 mmol/L (ref 3.5–5.2)
Sodium: 143 mmol/L (ref 134–144)
Total Protein: 7 g/dL (ref 6.0–8.5)
eGFR: 40 mL/min/{1.73_m2} — ABNORMAL LOW (ref >59)

## 2021-06-18 ENCOUNTER — Ambulatory Visit (HOSPITAL_COMMUNITY)
Admission: RE | Admit: 2021-06-18 | Discharge: 2021-06-18 | Disposition: A | Discharge status: Home / Self Care | Payer: Medicare Other | Source: Ambulatory Visit | Attending: Obstetrics and Gynecology | Admitting: Obstetrics and Gynecology

## 2021-06-18 ENCOUNTER — Other Ambulatory Visit: Payer: Self-pay | Admitting: Family Medicine

## 2021-06-18 ENCOUNTER — Other Ambulatory Visit: Payer: Self-pay | Admitting: Family

## 2021-06-18 ENCOUNTER — Other Ambulatory Visit: Payer: Self-pay

## 2021-06-18 DIAGNOSIS — Z1231 Encounter for screening mammogram for malignant neoplasm of breast: Secondary | ICD-10-CM | POA: Insufficient documentation

## 2021-06-18 DIAGNOSIS — M810 Age-related osteoporosis without current pathological fracture: Secondary | ICD-10-CM

## 2021-06-18 MED ORDER — ALENDRONATE SODIUM 70 MG PO TABS: 70.0000 mg | ORAL_TABLET | ORAL | 11 refills | Status: DC

## 2021-06-22 ENCOUNTER — Other Ambulatory Visit: Payer: Self-pay

## 2021-06-22 ENCOUNTER — Ambulatory Visit: Payer: Medicare Other | Attending: Family

## 2021-06-22 DIAGNOSIS — R531 Weakness: Secondary | ICD-10-CM | POA: Insufficient documentation

## 2021-06-22 DIAGNOSIS — R2681 Unsteadiness on feet: Secondary | ICD-10-CM | POA: Insufficient documentation

## 2021-06-22 DIAGNOSIS — R2689 Other abnormalities of gait and mobility: Secondary | ICD-10-CM | POA: Diagnosis not present

## 2021-06-22 DIAGNOSIS — M6281 Muscle weakness (generalized): Secondary | ICD-10-CM | POA: Diagnosis not present

## 2021-06-22 NOTE — Therapy (Signed)
Erin Khan, [place] Khan, [address] Phone: [phone]   Fax:  [phone]  Physical Therapy Treatment  Patient Details  Name: Erin Khan MRN: [id number] Date of Birth: 11-Sep-1940 Referring Provider (PT): Lenna Gilford   Encounter Date: 06/22/2021   PT End of Session - 06/22/21 0757     Visit Number 1    Number of Visits 12    Date for PT Re-Evaluation 08/10/21    PT Start Time 0814    PT Stop Time 0855    PT Time Calculation (min) 41 min    Activity Tolerance Patient tolerated treatment well    Behavior During Therapy [facility] for tasks assessed/performed             Past Medical History:  Diagnosis Date   Cataract    Colon polyps    Hypercholesteremia    Hypertension     Past Surgical History:  Procedure Laterality Date   ABDOMINAL HYSTERECTOMY     BILATERAL OOPHORECTOMY     CATARACT EXTRACTION, BILATERAL  2014   CHOLECYSTECTOMY  1980   COLONOSCOPY     COLONOSCOPY N/A 05/15/2013   Procedure: COLONOSCOPY;  Surgeon: Erin Houston, MD;  [facility];  Service: Endoscopy;  Laterality: N/A;  930   COLONOSCOPY N/A 10/18/2018   Procedure: COLONOSCOPY;  Surgeon: Erin Houston, MD;  [facility];  Service: Endoscopy;  Laterality: N/A;  830   cranial shunt     17 years ago   POLYPECTOMY  10/18/2018   Procedure: POLYPECTOMY;  Surgeon: Erin Houston, MD;  [facility];  Service: Endoscopy;;  colon    There were no vitals filed for this visit.   Subjective Assessment - 06/22/21 0805     Subjective Patient reports that she has been having balance problems and weakness for over a year now. She notes that therapy helped some last year, but she felt like it slowly began to get worse after she finished therapy. She notes that her last fall was due to missing a step while walking outside and watering her plants. She reports no dizziness.    Pertinent History Osteoporosis, hypertension,  and brain shunt    Patient Stated Goals Improve her balance,    Currently in Pain? No/denies                [facility] PT Assessment - 06/22/21 0001       Assessment   Referring Provider (PT) Hawks    Onset Date/Surgical Date --   2021   Next MD Visit --   To be determined   Prior Therapy Yes; 2021      Precautions   Precautions None      Balance Screen   Has the patient fallen in the past 6 months Yes    How many times? 1    Has the patient had a decrease in activity level because of a fear of falling?  Yes    Is the patient reluctant to leave their home because of a fear of falling?  No      Home Environment   Living Environment Private residence    Type of [place] to enter    Entrance Stairs-Number of Steps 1    Entrance Stairs-Rails Right    Home Layout One level      Prior Function   Level of [place] Retired  Leisure Housework; caring for grandchildren      Cognition   Overall Cognitive Status Within Functional Limits for tasks assessed    Attention Focused    Focused Attention Appears intact    Memory Appears intact    Awareness Appears intact    Problem Solving Appears intact      Sensation   Additional Comments Patient reports no numbness and tingling      ROM / Strength   AROM / PROM / Strength Strength      Strength   Strength Assessment Site Hip;Knee;Ankle    Right/Left Hip Right;Left    Right Hip Flexion 4-/5    Left Hip Flexion 4-/5    Right/Left Knee Left;Right    Right Knee Flexion 4-/5    Right Knee Extension 5/5    Left Knee Flexion 4-/5    Left Knee Extension 5/5    Right/Left Ankle Right;Left    Right Ankle Dorsiflexion 4/5    Left Ankle Dorsiflexion 4/5      Transfers   Transfers Sit to Stand;Stand to Sit    Sit to Stand 7: Independent;Without upper extremity assist    Five time sit to stand comments  13 seconds    Stand to Sit Uncontrolled descent;Without upper extremity  assist;7: Independent      Balance   Balance Assessed Yes      Static Standing Balance   Static Standing - Balance Support No upper extremity supported    Static Standing - Level of Assistance 5: Stand by assistance    Static Standing Balance -  Activities  Romberg - Eyes Opened;Romberg - Eyes Closed;Tandam Stance - Right Leg;Tandam Stance - Left Leg    Static Standing - Comment/# of Minutes Increased sway with eyes closed (rhomberg); no limitation with rhomberg stance; unable to perform tandem due to instability                           [facility] Adult PT Treatment/Exercise - 06/22/21 0001       Ambulation/Gait   Gait Pattern Within Functional Limits      Exercises   Exercises Knee/Hip      Knee/Hip Exercises: Standing   Other Standing Knee Exercises Hamstring curl   20 reps   Other Standing Knee Exercises Heel raises   20 reps                    PT Education - 06/22/21 1611     Education Details factors for balance, vestibular system    Person(s) Educated Patient    Methods Explanation    Comprehension Verbalized understanding                 PT Long Term Goals - 06/22/21 1614       PT LONG TERM GOAL #1   Title Patient will be independent with her HEP.    Time 6    Period Weeks    Status New    Target Date 08/03/21      PT LONG TERM GOAL #2   Title Patient will improve her 5x sit to stand to 12 seconds or less to reduce her fall risk.    Time 6    Period Weeks    Status New    Target Date 08/03/21      PT LONG TERM GOAL #3   Title Patient will report feeling confident walking outside and around her house.  Time 6    Period Weeks    Status New    Target Date 08/03/21                   Plan - 06/22/21 0805     Clinical Impression Statement Patient is a 80 year old female presenting to physical therapy due to lower extremity weakness and instability due to a fear of falling. She presents with increased  instability with narrow base of support and with eyes closed. Recommend that she continue with her updated plan of care to address her remaining impairments to return to her prior level of function.    Personal Factors and Comorbidities Time since onset of injury/illness/exacerbation;Comorbidity 1;Comorbidity 2;Comorbidity 3+    Comorbidities HTN, osteoporosis, and a brain shunt    Examination-Activity Limitations Locomotion Level;Transfers;Stand    Examination-Participation Restrictions Cleaning;Yard Work    Merchant [employer] officer Evolving/Moderate complexity    Clinical Decision Making Moderate    Rehab Potential Good    PT Frequency 2x / week    PT Duration 6 weeks    PT Treatment/Interventions ADLs/Self Care Home Management;Gait training;Stair training;Functional mobility training;Therapeutic activities;Therapeutic exercise;Balance training;Neuromuscular re-education;Patient/family education    PT Next Visit Plan FOTO; focus on lower extremity strengthening and balance    PT Home Exercise Plan Standing heel raises and hamstring curls (86minutes each; with upper extremity support from counter)    Consulted and Agree with Plan of Care Patient             Patient will benefit from skilled therapeutic intervention in order to improve the following deficits and impairments:  Difficulty walking, Decreased balance, Decreased strength  Visit Diagnosis: Muscle weakness (generalized)  Unsteadiness on feet     Problem List Patient Active Problem List   Diagnosis Date Noted   Vaginal vault prolapse 10/10/2019   History of colonic polyps 05/29/2018   CKD (chronic kidney disease), stage III ([place]) 07/07/2017   History of brain shunt 12/08/2016   Osteoporosis 12/08/2016   Hypertriglyceridemia 04/18/2016   HTN (hypertension) 03/21/2016   Hyperlipidemia 03/21/2016   Presence of pessary 10/14/2015    Darlin Coco, PT 06/22/2021, 4:22 PM  [facility] [address] [place], Erin Khan, [address] Phone: [phone]   Fax:  [phone]  Name: MARYLYN APPENZELLER MRN: [id number] Date of Birth: [birth year]

## 2021-06-24 ENCOUNTER — Telehealth: Payer: Self-pay | Admitting: *Deleted

## 2021-06-24 NOTE — Chronic Care Management (AMB) (Signed)
  Chronic Care Management   Note  06/24/2021 Name: Erin Khan MRN: [id number] DOB: September 20, 1940  Erin Khan is a 80 y.o. year old female who is a primary care patient of Sharion Balloon, FNP. Erin Khan is currently enrolled in care management services. An additional referral for Pharmacy was placed.   Follow up plan: Unsuccessful telephone outreach attempt made. The care management team will reach out to the patient again over the next 5 days. If patient returns call to provider office, please advise to call [place] [facility] [place] [facility]  Direct Dial: [phone]

## 2021-06-25 ENCOUNTER — Ambulatory Visit: Payer: Medicare Other

## 2021-06-25 ENCOUNTER — Other Ambulatory Visit: Payer: Self-pay

## 2021-06-25 DIAGNOSIS — M6281 Muscle weakness (generalized): Secondary | ICD-10-CM | POA: Diagnosis not present

## 2021-06-25 DIAGNOSIS — R531 Weakness: Secondary | ICD-10-CM | POA: Diagnosis not present

## 2021-06-25 DIAGNOSIS — R2689 Other abnormalities of gait and mobility: Secondary | ICD-10-CM | POA: Diagnosis not present

## 2021-06-25 DIAGNOSIS — R2681 Unsteadiness on feet: Secondary | ICD-10-CM | POA: Diagnosis not present

## 2021-06-25 NOTE — Therapy (Signed)
[facility] [place], [name], [address] Phone: [phone]   Fax:  [phone]  Physical Therapy Treatment  Patient Details  Name: Erin Khan MRN: [id number] Date of Birth: [birth year] Referring Provider (PT): Lenna Gilford   Encounter Date: 06/25/2021   PT End of Session - 06/25/21 0904     Visit Number 2    Number of Visits 12    Date for PT Re-Evaluation 08/10/21    PT Start Time 0900    PT Stop Time 0945    PT Time Calculation (min) 45 min    Activity Tolerance Patient tolerated treatment well    Behavior During Therapy [facility] for tasks assessed/performed             Past Medical History:  Diagnosis Date   Cataract    Colon polyps    Hypercholesteremia    Hypertension     Past Surgical History:  Procedure Laterality Date   ABDOMINAL HYSTERECTOMY     BILATERAL OOPHORECTOMY     CATARACT EXTRACTION, BILATERAL  2014   CHOLECYSTECTOMY  1980   COLONOSCOPY     COLONOSCOPY N/A 05/15/2013   Procedure: COLONOSCOPY;  Surgeon: Rogene Houston, MD;  [facility];  Service: Endoscopy;  Laterality: N/A;  930   COLONOSCOPY N/A 10/18/2018   Procedure: COLONOSCOPY;  Surgeon: Rogene Houston, MD;  [facility];  Service: Endoscopy;  Laterality: N/A;  830   cranial shunt     17 years ago   POLYPECTOMY  10/18/2018   Procedure: POLYPECTOMY;  Surgeon: Rogene Houston, MD;  [facility];  Service: Endoscopy;;  colon    There were no vitals filed for this visit.   Subjective Assessment - 06/25/21 0903     Subjective Patient reports that shee feels good today.    Pertinent History Osteoporosis, hypertension, and brain shunt    Patient Stated Goals Improve her balance,    Currently in Pain? No/denies                               [facility] Adult PT Treatment/Exercise - 06/25/21 0001       Knee/Hip Exercises: Aerobic   Nustep L5 x 10 minutes      Knee/Hip Exercises: Standing    Heel Raises Both   2 minutes   Knee Flexion Both   2 minutes   Lateral Step Up Both;Hand Hold: 2;Step Height: 4";20 reps    Forward Step Up Both;Hand Hold: 2;20 reps;Step Height: 4"      Knee/Hip Exercises: Seated   Long Arc Quad Both;Strengthening   2 minutes each                Balance Exercises - 06/25/21 0001       Balance Exercises: Standing   Standing Eyes Opened Narrow base of support (BOS);Foam/compliant surface;4 reps;30 secs    Marching Foam/compliant surface;Upper extremity assist 2;Static   3 minutes                    PT Long Term Goals - 06/22/21 1614       PT LONG TERM GOAL #1   Title Patient will be independent with her HEP.    Time 6    Period Weeks    Status New    Target Date 08/03/21      PT LONG TERM GOAL #2   Title Patient  will improve her 5x sit to stand to 12 seconds or less to reduce her fall risk.    Time 6    Period Weeks    Status New    Target Date 08/03/21      PT LONG TERM GOAL #3   Title Patient will report feeling confident walking outside and around her house.    Time 6    Period Weeks    Status New    Target Date 08/03/21                   Plan - 06/25/21 8891     Clinical Impression Statement Patient presented to her first follow up with poor recall of her home exercise program. She required moderate cuing with these interventions for proper exercise performance to facilitate lower extremity strength and stability. She was introduced to multiple new interventions for improved strength and stability needed for functional activities. She reported feeling alright upon the conclusion of treatment. She continues to require skilled physical therapy to address her remaining impairments to safely return to her prior level of function.    Personal Factors and Comorbidities Time since onset of injury/illness/exacerbation;Comorbidity 1;Comorbidity 2;Comorbidity 3+    Comorbidities HTN, osteoporosis, and a brain  shunt    Examination-Activity Limitations Locomotion Level;Transfers;Stand    Examination-Participation Restrictions Cleaning;Yard Work    Merchant [employer] officer Evolving/Moderate complexity    Rehab Potential Good    PT Frequency 2x / week    PT Duration 6 weeks    PT Treatment/Interventions ADLs/Self Care Home Management;Gait training;Stair training;Functional mobility training;Therapeutic activities;Therapeutic exercise;Balance training;Neuromuscular re-education;Patient/family education    PT Next Visit Plan focus on lower extremity strengthening and balance; recumbent bike    PT Home Exercise Plan Standing heel raises and hamstring curls (15minutes each; with upper extremity support from counter)    Consulted and Agree with Plan of Care Patient             Patient will benefit from skilled therapeutic intervention in order to improve the following deficits and impairments:  Difficulty walking, Decreased balance, Decreased strength  Visit Diagnosis: Muscle weakness (generalized)  Unsteadiness on feet     Problem List Patient Active Problem List   Diagnosis Date Noted   Vaginal vault prolapse 10/10/2019   History of colonic polyps 05/29/2018   CKD (chronic kidney disease), stage III ([place]) 07/07/2017   History of brain shunt 12/08/2016   Osteoporosis 12/08/2016   Hypertriglyceridemia 04/18/2016   HTN (hypertension) 03/21/2016   Hyperlipidemia 03/21/2016   Presence of pessary 10/14/2015    Darlin Coco, PT 06/25/2021, 12:24 PM  [facility] [address] [place], [name], [address] Phone: [phone]   Fax:  [phone]  Name: Erin Khan MRN: [id number] Date of Birth: [birth year]

## 2021-06-30 ENCOUNTER — Ambulatory Visit: Payer: Medicare Other | Admitting: Physical Therapy

## 2021-06-30 NOTE — Chronic Care Management (AMB) (Signed)
  Chronic Care Management   Note  06/30/2021 Name: Erin Khan MRN: [id number] DOB: [birth year]  Erin Khan is a 80 y.o. year old female who is a primary care patient of Sharion Balloon, FNP. Erin Khan is currently enrolled in care management services. An additional referral for pharmacy was placed.   Follow up plan: Telephone appointment with care management team member scheduled for:07/21/21  [place] [facility]  Direct Dial: [phone]

## 2021-07-02 ENCOUNTER — Ambulatory Visit: Payer: Medicare Other | Attending: Family

## 2021-07-02 ENCOUNTER — Other Ambulatory Visit: Payer: Self-pay

## 2021-07-02 DIAGNOSIS — M6281 Muscle weakness (generalized): Secondary | ICD-10-CM | POA: Diagnosis not present

## 2021-07-02 DIAGNOSIS — R2681 Unsteadiness on feet: Secondary | ICD-10-CM | POA: Insufficient documentation

## 2021-07-02 NOTE — Therapy (Addendum)
[facility] [place], [name], [address] Phone: [phone]   Fax:  [phone]  Physical Therapy Treatment  Patient Details  Name: Erin Khan MRN: [id number] Date of Birth: 1940/12/01 Referring Provider (PT): Lenna Gilford   Encounter Date: 07/02/2021   PT End of Session - 07/02/21 0852     Visit Number 3    Number of Visits 12    Date for PT Re-Evaluation 08/10/21    PT Start Time 0900    PT Stop Time 0945    PT Time Calculation (min) 45 min    Activity Tolerance Patient tolerated treatment well    Behavior During Therapy [facility] for tasks assessed/performed             Past Medical History:  Diagnosis Date   Cataract    Colon polyps    Hypercholesteremia    Hypertension     Past Surgical History:  Procedure Laterality Date   ABDOMINAL HYSTERECTOMY     BILATERAL OOPHORECTOMY     CATARACT EXTRACTION, BILATERAL  2014   CHOLECYSTECTOMY  1980   COLONOSCOPY     COLONOSCOPY N/A 05/15/2013   Procedure: COLONOSCOPY;  Surgeon: Rogene Houston, MD;  [facility];  Service: Endoscopy;  Laterality: N/A;  930   COLONOSCOPY N/A 10/18/2018   Procedure: COLONOSCOPY;  Surgeon: Rogene Houston, MD;  [facility];  Service: Endoscopy;  Laterality: N/A;  830   cranial shunt     17 years ago   POLYPECTOMY  10/18/2018   Procedure: POLYPECTOMY;  Surgeon: Rogene Houston, MD;  [facility];  Service: Endoscopy;;  colon    There were no vitals filed for this visit.   Subjective Assessment - 07/02/21 0851     Subjective Patient reports that she feels like her balance has been improving. She feels comfortable walking around outside without her husband helping.    Pertinent History Osteoporosis, hypertension, and brain shunt    Patient Stated Goals Improve her balance,    Currently in Pain? No/denies                               [facility] Adult PT Treatment/Exercise - 07/02/21 0001        Knee/Hip Exercises: Aerobic   Nustep L6 x 10 minutes      Knee/Hip Exercises: Machines for Strengthening   Cybex Knee Extension 3x6; 30 lbs    Cybex Knee Flexion 2 minutes; 30 lbs      Knee/Hip Exercises: Standing   Rocker Board 2 minutes   BUE support     Knee/Hip Exercises: Supine   Bridges Both;Strengthening;2 sets;15 reps    Straight Leg Raises Strengthening;Both;2 sets;10 reps                 Balance Exercises - 07/02/21 0001       Balance Exercises: Standing   Tandem Stance 4 reps;Intermittent upper extremity support;Eyes open;30 secs   Semi-tandem   Marching Foam/compliant surface;Upper extremity assist 2;Static   3 minutes                    PT Long Term Goals - 06/22/21 1614       PT LONG TERM GOAL #1   Title Patient will be independent with her HEP.    Time 6    Period Weeks    Status New    Target Date  08/03/21      PT LONG TERM GOAL #2   Title Patient will improve her 5x sit to stand to 12 seconds or less to reduce her fall risk.    Time 6    Period Weeks    Status New    Target Date 08/03/21      PT LONG TERM GOAL #3   Title Patient will report feeling confident walking outside and around her house.    Time 6    Period Weeks    Status New    Target Date 08/03/21                   Plan - 07/02/21 4403     Clinical Impression Statement Patient was progressed with bridges and straight leg raises in addition to familiar interventions for lower extremity strength. She required moderate verbal and tactile cuing with straight leg raises for proper exercise performance to facilitate hip flexor strengthening. Fatigue was her primary limitation with today's interventions as she required breif rest breaks throughout treatment. She reported feeling that she may be sore tomorrow, but she feels alright upon the conclusion of treatment. She continues to require skilled physical therapy to address her remaining impairments to return  to her prior level of function.    Personal Factors and Comorbidities Time since onset of injury/illness/exacerbation;Comorbidity 1;Comorbidity 2;Comorbidity 3+    Comorbidities HTN, osteoporosis, and a brain shunt    Examination-Activity Limitations Locomotion Level;Transfers;Stand    Examination-Participation Restrictions Cleaning;Yard Work    Merchant navy officer Evolving/Moderate complexity    Rehab Potential Good    PT Frequency 2x / week    PT Duration 6 weeks    PT Treatment/Interventions ADLs/Self Care Home Management;Gait training;Stair training;Functional mobility training;Therapeutic activities;Therapeutic exercise;Balance training;Neuromuscular re-education;Patient/family education    PT Next Visit Plan focus on lower extremity strengthening and balance; recumbent bike    PT Home Exercise Plan Standing heel raises and hamstring curls (81minutes each; with upper extremity support from counter)    Consulted and Agree with Plan of Care Patient             Patient will benefit from skilled therapeutic intervention in order to improve the following deficits and impairments:  Difficulty walking, Decreased balance, Decreased strength  Visit Diagnosis: Muscle weakness (generalized)  Unsteadiness on feet     Problem List Patient Active Problem List   Diagnosis Date Noted   Vaginal vault prolapse 10/10/2019   History of colonic polyps 05/29/2018   CKD (chronic kidney disease), stage III ([place]) 07/07/2017   History of brain shunt 12/08/2016   Osteoporosis 12/08/2016   Hypertriglyceridemia 04/18/2016   HTN (hypertension) 03/21/2016   Hyperlipidemia 03/21/2016   Presence of pessary 10/14/2015    Darlin Coco, PT 07/02/2021, 9:49 AM  [facility] [place], [name], [address] Phone: [phone]   Fax:  [phone]  Name: Erin Khan MRN: [id number] Date of Birth: 11-30-1940  PHYSICAL THERAPY  DISCHARGE SUMMARY  Visits from Start of Care: 3  Current functional level related to goals / functional outcomes: Patient is being discharged at this time as she has not returned to therapy since her last appointment on 07/02/21. She had reported improved confidence with walking outside at her last appointment.    Remaining deficits: Lower extremity power and stability    Education / Equipment: HEP    Patient agrees to discharge. Patient goals were partially met. Patient is being discharged due to not returning  since the last visit.

## 2021-07-09 DIAGNOSIS — I10 Essential (primary) hypertension: Secondary | ICD-10-CM | POA: Diagnosis not present

## 2021-07-09 DIAGNOSIS — R21 Rash and other nonspecific skin eruption: Secondary | ICD-10-CM | POA: Diagnosis not present

## 2021-07-12 ENCOUNTER — Other Ambulatory Visit: Payer: Self-pay

## 2021-07-12 ENCOUNTER — Encounter: Payer: Self-pay | Admitting: Family

## 2021-07-12 ENCOUNTER — Ambulatory Visit (INDEPENDENT_AMBULATORY_CARE_PROVIDER_SITE_OTHER): Payer: Medicare Other | Admitting: Family

## 2021-07-12 VITALS — BP 158/77 | HR 55 | Temp 96.6°F | Ht 66.0 in | Wt 153.8 lb

## 2021-07-12 DIAGNOSIS — R6889 Other general symptoms and signs: Secondary | ICD-10-CM | POA: Diagnosis not present

## 2021-07-12 DIAGNOSIS — R001 Bradycardia, unspecified: Secondary | ICD-10-CM | POA: Diagnosis not present

## 2021-07-12 DIAGNOSIS — I10 Essential (primary) hypertension: Secondary | ICD-10-CM | POA: Diagnosis not present

## 2021-07-12 DIAGNOSIS — N183 Chronic kidney disease, stage 3 unspecified: Secondary | ICD-10-CM | POA: Diagnosis not present

## 2021-07-12 DIAGNOSIS — R5383 Other fatigue: Secondary | ICD-10-CM

## 2021-07-12 MED ORDER — LISINOPRIL 40 MG PO TABS: 40.0000 mg | ORAL_TABLET | Freq: Every day | ORAL | 3 refills | Status: DC

## 2021-07-12 NOTE — Progress Notes (Signed)
Subjective:    Patient ID: Erin Khan, female    DOB: June 22, 1941, 80 y.o.   MRN: [id number]  Chief Complaint  Patient presents with   Hypertension    Martin Majestic to the Urgent Care on Friday    PT presents to the office today with Urgent Care follow up. She went to the ED on 07/09/21 with HTN. She was started on clonidine 0.1 mg BID and hydroxyzine 25 mg TID for itching on her face.   She reports her BP at home is 150/84. She is complaining of fatigue that started 6-7 weeks ago.  Hypertension This is a chronic problem. The current episode started more than 1 year ago. The problem has been waxing and waning since onset. The problem is uncontrolled. Associated symptoms include malaise/fatigue. Pertinent negatives include no headaches, peripheral edema or shortness of breath. Risk factors for coronary artery disease include dyslipidemia and sedentary lifestyle. Past treatments include ACE inhibitors and alpha 1 blockers. The current treatment provides mild improvement.     Review of Systems  Constitutional:  Positive for malaise/fatigue.  Respiratory:  Negative for shortness of breath.   Neurological:  Negative for headaches.  All other systems reviewed and are negative.     Objective:   Physical Exam Vitals reviewed.  Constitutional:      General: She is not in acute distress.    Appearance: She is well-developed.  HENT:     Head: Normocephalic and atraumatic.     Right Ear: Tympanic membrane normal.     Left Ear: Tympanic membrane normal.  Eyes:     Pupils: Pupils are equal, round, and reactive to light.  Neck:     Thyroid: No thyromegaly.  Cardiovascular:     Rate and Rhythm: Regular rhythm. Bradycardia present.     Heart sounds: Normal heart sounds. No murmur heard. Pulmonary:     Effort: Pulmonary effort is normal. No respiratory distress.     Breath sounds: Normal breath sounds. No wheezing.  Abdominal:     General: Bowel sounds are normal. There is no distension.      Palpations: Abdomen is soft.     Tenderness: There is no abdominal tenderness.  Musculoskeletal:        General: No tenderness. Normal range of motion.     Cervical back: Normal range of motion and neck supple.  Skin:    General: Skin is warm and dry.  Neurological:     Mental Status: She is alert and oriented to person, place, and time.     Cranial Nerves: No cranial nerve deficit.     Deep Tendon Reflexes: Reflexes are normal and symmetric.  Psychiatric:        Behavior: Behavior normal.        Thought Content: Thought content normal.        Judgment: Judgment normal.      BP (!) 158/70   Pulse (!) 56   Temp (!) 96.6 F (35.9 C) (Temporal)   Ht '5\' 6"'  (1.676 m)   Wt 153 lb 12.8 oz (69.8 kg)   BMI 24.82 kg/m      Assessment & Plan:  Erin Khan comes in today with chief complaint of Hypertension (Went to the Urgent Care on Friday )   Diagnosis and orders addressed:  1. Primary hypertension Will increase lisinopril to 40 mg from 78m  - EKG 12-Lead - Ambulatory referral to Cardiology - lisinopril (ZESTRIL) 40 MG tablet; Take 1 tablet (40 mg total)  by mouth daily.  Dispense: 90 tablet; Refill: 3 - CMP14+EGFR - CBC with Differential/Platelet  2. Stage 3 chronic kidney disease, unspecified whether stage 3a or 3b CKD (HCC) - CMP14+EGFR - CBC with Differential/Platelet  3. Bradycardia - EKG 12-Lead - Ambulatory referral to Cardiology - CMP14+EGFR - CBC with Differential/Platelet  4. Other fatigue - EKG 12-Lead - Ambulatory referral to Cardiology - CMP14+EGFR - CBC with Differential/Platelet - Vitamin B12 - TSH   Labs pending Health Maintenance reviewed Diet and exercise encouraged  Follow up plan: 3 months    Evelina Dun, FNP

## 2021-07-12 NOTE — Patient Instructions (Signed)

## 2021-07-13 LAB — VITAMIN B12: Vitamin B-12: 538 pg/mL (ref 232–1245)

## 2021-07-13 LAB — CMP14+EGFR
ALT: 11 IU/L (ref 0–32)
AST: 10 IU/L (ref 0–40)
Albumin/Globulin Ratio: 1.5 (ref 1.2–2.2)
Albumin: 4.3 g/dL (ref 3.7–4.7)
Alkaline Phosphatase: 90 IU/L (ref 44–121)
BUN/Creatinine Ratio: 26 (ref 12–28)
BUN: 29 mg/dL — ABNORMAL HIGH (ref 8–27)
Bilirubin Total: 0.4 mg/dL (ref 0.0–1.2)
CO2: 23 mmol/L (ref 20–29)
Calcium: 9.6 mg/dL (ref 8.7–10.3)
Chloride: 104 mmol/L (ref 96–106)
Creatinine, Ser: 1.1 mg/dL — ABNORMAL HIGH (ref 0.57–1.00)
Globulin, Total: 2.9 g/dL (ref 1.5–4.5)
Glucose: 88 mg/dL (ref 70–99)
Potassium: 5.1 mmol/L (ref 3.5–5.2)
Sodium: 143 mmol/L (ref 134–144)
Total Protein: 7.2 g/dL (ref 6.0–8.5)
eGFR: 51 mL/min/{1.73_m2} — ABNORMAL LOW (ref >59)

## 2021-07-13 LAB — CBC WITH DIFFERENTIAL/PLATELET
Basophils Absolute: 0.1 10*3/uL (ref 0.0–0.2)
Basos: 1 %
EOS (ABSOLUTE): 0.2 10*3/uL (ref 0.0–0.4)
Eos: 3 %
Hematocrit: 38.6 % (ref 34.0–46.6)
Hemoglobin: 13.1 g/dL (ref 11.1–15.9)
Immature Grans (Abs): 0 10*3/uL (ref 0.0–0.1)
Immature Granulocytes: 0 %
Lymphocytes Absolute: 2 10*3/uL (ref 0.7–3.1)
Lymphs: 23 %
MCH: 31.1 pg (ref 26.6–33.0)
MCHC: 33.9 g/dL (ref 31.5–35.7)
MCV: 92 fL (ref 79–97)
Monocytes Absolute: 1.1 10*3/uL — ABNORMAL HIGH (ref 0.1–0.9)
Monocytes: 12 %
Neutrophils Absolute: 5.3 10*3/uL (ref 1.4–7.0)
Neutrophils: 61 %
Platelets: 185 10*3/uL (ref 150–450)
RBC: 4.21 x10E6/uL (ref 3.77–5.28)
RDW: 12.8 % (ref 11.7–15.4)
WBC: 8.8 10*3/uL (ref 3.4–10.8)

## 2021-07-13 LAB — TSH: TSH: 0.976 u[IU]/mL (ref 0.450–4.500)

## 2021-07-14 ENCOUNTER — Telehealth: Payer: Self-pay | Admitting: Family

## 2021-07-14 NOTE — Telephone Encounter (Signed)
ok 

## 2021-07-21 ENCOUNTER — Ambulatory Visit (INDEPENDENT_AMBULATORY_CARE_PROVIDER_SITE_OTHER): Payer: Medicare Other | Admitting: Pharmacist

## 2021-07-21 DIAGNOSIS — M810 Age-related osteoporosis without current pathological fracture: Secondary | ICD-10-CM | POA: Diagnosis not present

## 2021-07-21 NOTE — Progress Notes (Signed)
    07/21/2021 Name: Erin Khan MRN: [id number] DOB: 1941-02-27   S:  26 YOF Presents for OSTEOPOROSIS evaluation, education, and management.  Her DEXA was on 06/15/2021  Current Height:  5'6"      Max Lifetime Height:  5'6" Current Weight:   153lbs      Ethnicity:Caucasian   HPI: New diagnosis of Osteoporosis?  Yes  Back Pain?  No       Kyphosis?  No Prior fracture?  No Med(s) for Osteoporosis/Osteopenia:  calcium/vit D Med(s) previously tried for Osteoporosis/Osteopenia:  n/a                                                             PMH: HRT? No Steroid Use?  No Thyroid med?  No History of cancer?  No History of digestive disorders (ie Crohn's)?  No Current or previous eating disorders?  No Last Vitamin D Result:  29.1 (2019)-WILL REPEAT Last GFR Result:  51; 07/12/21   FH/SH: Family history of osteoporosis?  No Parent with history of hip fracture?  No Family history of breast cancer?  No Exercise?  Yes WALKS DAILY Smoking?  No Alcohol?  No    Calcium Assessment Calcium Intake  # of servings/day  Calcium mg  Milk (8 oz) 1  x  300  = 300  Yogurt (4 oz) 0 x  200 = 0  Cheese (1 oz) 1 x  200 = 200  Other Calcium sources   250mg   Ca supplement 1 = 600   Estimated calcium intake per day 1350    DEXA Results 06/15/2021 FINDINGS:  AP LUMBAR SPINE L1, L2 and L4 Bone Mineral Density (BMD):  0.971 g/cm2 Young Adult T-Score:  -1.7   LEFT FEMUR TOTAL Bone Mineral Density (BMD):  0.607 g/cm2 Young Adult T-Score: -3.2   Scan quality: The scan quality is good. Exclusions: L3 excluded due to degenerative change.   ASSESSMENT: Patient's diagnostic category is OSTEOPOROSIS by WHO Criteria.   FRACTURE RISK: INCREASED   FRAX: World Health Organization FRAX assessment of absolute fracture risk is not calculated for this patient because the patient has osteoporosis.   COMPARISON: 12/08/2016. Since the prior study there has been an 8.8% decrease in density for  the total mean proximal femurs with no significant change for the lumbar spine.    Recommendations: 1.  Start  alendronate (FOSAMAX)  2.  recommend calcium 1200mg  daily through supplementation or diet.  3.  recommend weight bearing exercise - 30 minutes at least 4 days per week.   4.  Counseled and educated about fall risk and prevention.  Recheck DEXA:  2 years  Time spent counseling patient:  20 minutes  Regina Eck, PharmD, BCPS Clinical Pharmacist, [facility] Phone [phone]

## 2021-09-13 DIAGNOSIS — R001 Bradycardia, unspecified: Secondary | ICD-10-CM | POA: Insufficient documentation

## 2021-09-13 NOTE — Progress Notes (Signed)
Cardiology Office Note   Date:  09/15/2021   ID:  FOREST PRUDEN, DOB 11-08-1940, MRN [id number]  PCP:  Sharion Balloon, FNP  Cardiologist:   None Referring:  Sharion Balloon, FNP  Chief Complaint  Patient presents with   Hypertension      History of Present Illness: Erin Khan is a 81 y.o. female who is referred by Sharion Balloon, FNP for evaluation of hypertension and bradycardia.  She has had difficult to control hypertension and has measured below.  She says it always goes up when she is in the doctor's office but she also reports that it is 150/70 at home probably is an average.  She has been noted to have a low heart rate.  Med titration therefore is a little bit complicated.  She has not had any acute cardiovascular complications. The patient denies any new symptoms such as chest discomfort, neck or arm discomfort. There has been no new shortness of breath, PND or orthopnea. There have been no reported palpitations, presyncope or syncope.   She says vacuuming makes her very tired and she does have some increased fatigue.  She has had balance issues but she has worked with physical therapy.  She has not passed out or fallen.   Past Medical History:  Diagnosis Date   Cataract    Colon polyps    Hypercholesteremia    Hypertension     Past Surgical History:  Procedure Laterality Date   ABDOMINAL HYSTERECTOMY     BILATERAL OOPHORECTOMY     CATARACT EXTRACTION, BILATERAL  2014   CHOLECYSTECTOMY  1980   COLONOSCOPY     COLONOSCOPY N/A 05/15/2013   Procedure: COLONOSCOPY;  Surgeon: Rogene Houston, MD;  [facility];  Service: Endoscopy;  Laterality: N/A;  930   COLONOSCOPY N/A 10/18/2018   Procedure: COLONOSCOPY;  Surgeon: Rogene Houston, MD;  [facility];  Service: Endoscopy;  Laterality: N/A;  830   cranial shunt     17 years ago   POLYPECTOMY  10/18/2018   Procedure: POLYPECTOMY;  Surgeon: Rogene Houston, MD;  [facility];   Service: Endoscopy;;  colon     Current Outpatient Medications  Medication Sig Dispense Refill   alendronate (FOSAMAX) 70 MG tablet Take 1 tablet (70 mg total) by mouth every 7 (seven) days. Take with a full glass of water on an empty stomach. 4 tablet 11   amLODipine (NORVASC) 5 MG tablet Take 1 tablet (5 mg total) by mouth daily. 90 tablet 3   Calcium Carbonate-Vitamin D (CALCIUM 600+D PO) Take 1 tablet by mouth daily.     cloNIDine (CATAPRES) 0.1 MG tablet Take 0.1 mg by mouth 2 (two) times daily.     Ergocalciferol (VITAMIN D2) 10 MCG (400 UNIT) TABS Take 400 Units by mouth daily.     ezetimibe (ZETIA) 10 MG tablet Take 1 tablet (10 mg total) by mouth daily. 90 tablet 3   hydrOXYzine (ATARAX/VISTARIL) 25 MG tablet Take 25 mg by mouth every 6 (six) hours as needed.     lisinopril (ZESTRIL) 40 MG tablet Take 1 tablet (40 mg total) by mouth daily. 90 tablet 3   rosuvastatin (CRESTOR) 10 MG tablet Take 1 tablet (10 mg total) by mouth daily. 90 tablet 3   vitamin B-12 (CYANOCOBALAMIN) 500 MCG tablet Take 500 mcg by mouth daily.      No current facility-administered medications for this visit.    Allergies:  Asa [aspirin] and Statins    Social History:  The patient  reports that she has never smoked. She has never used smokeless tobacco. She reports that she does not drink alcohol and does not use drugs.   Family History:  The patient's family history includes Arthritis in her sister; Asthma in her brother; Healthy in her daughter and son; Heart attack (age of onset: 69) in her brother; Heart disease in her brother; Hypertension in her father; Lung cancer in her sister.    ROS:  Please see the history of present illness.   Otherwise, review of systems are positive for none.   All other systems are reviewed and negative.    PHYSICAL EXAM: VS:  BP (!) 192/92 (BP Location: Right Arm, Patient Position: Sitting, Cuff Size: Normal)    Pulse 80    Ht 5\' 6"  (1.676 m)    Wt 150 lb 9.6 oz (68.3  kg)    SpO2 98%    BMI 24.31 kg/m  , BMI Body mass index is 24.31 kg/m. GENERAL:  Well appearing HEENT:  Pupils equal round and reactive, fundi not visualized, oral mucosa unremarkable NECK:  No jugular venous distention, waveform within normal limits, carotid upstroke brisk and symmetric, no bruits, no thyromegaly LYMPHATICS:  No cervical, inguinal adenopathy LUNGS:  Clear to auscultation bilaterally BACK:  No CVA tenderness CHEST:  Unremarkable HEART:  PMI not displaced or sustained,S1 and S2 within normal limits, no S3, no S4, no clicks, no rubs, no murmurs ABD:  Flat, positive bowel sounds normal in frequency in pitch, no bruits, no rebound, no guarding, no midline pulsatile mass, no hepatomegaly, no splenomegaly EXT:  2 plus pulses throughout, no edema, no cyanosis no clubbing SKIN:  No rashes no nodules NEURO:  Cranial nerves II through XII grossly intact, motor grossly intact throughout PSYCH:  Cognitively intact, oriented to person place and time    EKG:  EKG is not ordered today. The ekg ordered today demonstrates sinus rhythm, rate 57, axis within normal limits, intervals within normal limits, no acute ST-T wave changes.   Recent Labs: 07/12/2021: ALT 11; BUN 29; Creatinine, Ser 1.10; Hemoglobin 13.1; Platelets 185; Potassium 5.1; Sodium 143; TSH 0.976    Lipid Panel    Component Value Date/Time   CHOL 215 (H) 12/02/2020 1308   TRIG 242 (H) 12/02/2020 1308   HDL 42 12/02/2020 1308   CHOLHDL 5.1 (H) 12/02/2020 1308   LDLCALC 130 (H) 12/02/2020 1308   LDLDIRECT 125 (H) 10/09/2017 1605      Wt Readings from Last 3 Encounters:  09/15/21 150 lb 9.6 oz (68.3 kg)  07/12/21 153 lb 12.8 oz (69.8 kg)  06/15/21 151 lb 9.6 oz (68.8 kg)      Other studies Reviewed: Additional studies/ records that were reviewed today include: Labs. Review of the above records demonstrates:  Please see elsewhere in the note.     ASSESSMENT AND PLAN:  BRADYCARDIA:    The patient does  have a low heart rate which means we need to avoid any AV nodal blocking or negative chronotropic antihypertensives.  However, she has no symptoms and no indication for pacing or further testing.  HTN: Her blood pressure is not controlled.  I would like to not use clonidine as it might be contributing to the fatigue.  There is some contribution of bradycardia as well.  I think she would tolerate amlodipine and I am going to start with 5 mg with a plan to titrate upward.  I would consider spironolactone but she has some renal insufficiency so this would have to be done carefully.  I will start the amlodipine and hopefully be able to get rid of the clonidine at the next visit.  FATIGUE: As above.  She did see recent labs and she is not anemic.  She had a thyroid done late last year that was normal.  Current medicines are reviewed at length with the patient today.  The patient does not have concerns regarding medicines.  The following changes have been made:  no change  Labs/ tests ordered today include: None No orders of the defined types were placed in this encounter.    Disposition:   FU with me in one month.     Signed, Minus Breeding, MD  09/15/2021 10:58 AM    [facility]

## 2021-09-15 ENCOUNTER — Ambulatory Visit (INDEPENDENT_AMBULATORY_CARE_PROVIDER_SITE_OTHER): Payer: Medicare Other | Admitting: Cardiology

## 2021-09-15 ENCOUNTER — Encounter: Payer: Self-pay | Admitting: Cardiology

## 2021-09-15 ENCOUNTER — Other Ambulatory Visit: Payer: Self-pay

## 2021-09-15 VITALS — BP 192/92 | HR 80 | Ht 66.0 in | Wt 150.6 lb

## 2021-09-15 DIAGNOSIS — R001 Bradycardia, unspecified: Secondary | ICD-10-CM | POA: Diagnosis not present

## 2021-09-15 DIAGNOSIS — I10 Essential (primary) hypertension: Secondary | ICD-10-CM | POA: Diagnosis not present

## 2021-09-15 MED ORDER — AMLODIPINE BESYLATE 5 MG PO TABS: 5.0000 mg | ORAL_TABLET | Freq: Every day | ORAL | 3 refills | Status: DC

## 2021-09-15 NOTE — Patient Instructions (Signed)
Medication Instructions:  Please start Amlodipine 5 mg a day. Continue all other medications as listed.  *If you need a refill on your cardiac medications before your next appointment, please call your pharmacy*  Follow-Up: At [facility], you and your health needs are our priority.  As part of our continuing mission to provide you with exceptional heart care, we have created designated Provider Care Teams.  These Care Teams include your primary Cardiologist (physician) and Advanced Practice Providers (APPs -  Physician Assistants and Nurse Practitioners) who all work together to provide you with the care you need, when you need it.  We recommend signing up for the patient portal called "MyChart".  Sign up information is provided on this After Visit Summary.  MyChart is used to connect with patients for Virtual Visits (Telemedicine).  Patients are able to view lab/test results, encounter notes, upcoming appointments, etc.  Non-urgent messages can be sent to your provider as well.   To learn more about what you can do with MyChart, go to [email or web address].    Your next appointment:   1 month(s)  The format for your next appointment:   In Person  Provider:   Minus Breeding, MD   Thank you for choosing [facility]!!

## 2021-10-12 DIAGNOSIS — Z960 Presence of urogenital implants: Secondary | ICD-10-CM | POA: Diagnosis not present

## 2021-10-17 NOTE — Progress Notes (Signed)
Cardiology Office Note   Date:  10/20/2021   ID:  Erin Khan, Erin Khan, Erin Khan, Erin Khan  PCP:  Sharion Balloon, FNP  Cardiologist:   None Referring:  Sharion Balloon, FNP  Chief Complaint  Patient presents with   Bradycardia      History of Present Illness: Erin Khan is a 81 y.o. female who is referred by Sharion Balloon, FNP for evaluation of hypertension and bradycardia.  She has had difficult to control hypertension and I have been seeing her to titrate her meds.  I was planning to discontinue her clonidine altogether because of fatigue and probably go up on amlodipine and maybe consider spironolactone.  However, this week she comes back and she is feeling much better.  She started taking B12 over-the-counter and she thinks this helped.  She is doing a little more walking.  She thinks her fatigue is improved.   The patient denies any new symptoms such as chest discomfort, neck or arm discomfort. There has been no new shortness of breath, PND or orthopnea. There have been no reported palpitations, presyncope or syncope.    Past Medical History:  Diagnosis Date   Cataract    Colon polyps    Hypercholesteremia    Hypertension     Past Surgical History:  Procedure Laterality Date   ABDOMINAL HYSTERECTOMY     BILATERAL OOPHORECTOMY     CATARACT EXTRACTION, BILATERAL  2014   CHOLECYSTECTOMY  1980   COLONOSCOPY     COLONOSCOPY N/A 05/15/2013   Procedure: COLONOSCOPY;  Surgeon: Rogene Houston, MD;  [facility];  Service: Endoscopy;  Laterality: N/A;  930   COLONOSCOPY N/A 10/18/2018   Procedure: COLONOSCOPY;  Surgeon: Rogene Houston, MD;  [facility];  Service: Endoscopy;  Laterality: N/A;  830   cranial shunt     17 years ago   POLYPECTOMY  10/18/2018   Procedure: POLYPECTOMY;  Surgeon: Rogene Houston, MD;  [facility];  Service: Endoscopy;;  colon     Current Outpatient Medications  Medication Sig Dispense Refill    alendronate (FOSAMAX) 70 MG tablet Take 1 tablet (70 mg total) by mouth every 7 (seven) days. Take with a full glass of water on an empty stomach. 4 tablet 11   amLODipine (NORVASC) 5 MG tablet Take 1 tablet (5 mg total) by mouth daily. 90 tablet 3   Calcium Carbonate-Vitamin D (CALCIUM 600+D PO) Take 1 tablet by mouth daily.     cloNIDine (CATAPRES) 0.1 MG tablet Take 0.1 mg by mouth 2 (two) times daily.     Ergocalciferol (VITAMIN D2) 10 MCG (400 UNIT) TABS Take 400 Units by mouth daily.     ezetimibe (ZETIA) 10 MG tablet Take 1 tablet (10 mg total) by mouth daily. 90 tablet 3   lisinopril (ZESTRIL) 40 MG tablet Take 1 tablet (40 mg total) by mouth daily. 90 tablet 3   rosuvastatin (CRESTOR) 10 MG tablet Take 1 tablet (10 mg total) by mouth daily. 90 tablet 3   vitamin B-12 (CYANOCOBALAMIN) 500 MCG tablet Take 500 mcg by mouth daily.      hydrOXYzine (ATARAX/VISTARIL) 25 MG tablet Take 25 mg by mouth every 6 (six) hours as needed. (Patient not taking: Reported on 10/20/2021)     No current facility-administered medications for this visit.    Allergies:   Asa [aspirin] and Statins    ROS:  Please see the history of present illness.  Otherwise, review of systems are positive for none.   All other systems are reviewed and negative.    PHYSICAL EXAM: VS:  BP (!) 142/80    Pulse 76    Ht 5\' 6"  (1.676 m)    Wt 151 lb (68.5 kg)    BMI 24.37 kg/m  , BMI Body mass index is 24.37 kg/m. GENERAL:  Well appearing NECK:  No jugular venous distention, waveform within normal limits, carotid upstroke brisk and symmetric, no bruits, no thyromegaly LUNGS:  Clear to auscultation bilaterally CHEST:  Unremarkable HEART:  PMI not displaced or sustained,S1 and S2 within normal limits, no S3, no S4, no clicks, no rubs, no murmurs ABD:  Flat, positive bowel sounds normal in frequency in pitch, no bruits, no rebound, no guarding, no midline pulsatile mass, no hepatomegaly, no splenomegaly EXT:  2 plus pulses  throughout, no edema, no cyanosis no clubbing   EKG:  EKG is not ordered today.    Recent Labs: 07/12/2021: ALT 11; BUN 29; Creatinine, Ser 1.10; Hemoglobin 13.1; Platelets 185; Potassium 5.1; Sodium 143; TSH 0.976    Lipid Panel    Component Value Date/Time   CHOL 215 (H) 12/02/2020 1308   TRIG 242 (H) 12/02/2020 1308   HDL 42 12/02/2020 1308   CHOLHDL 5.1 (H) 12/02/2020 1308   LDLCALC 130 (H) 12/02/2020 1308   LDLDIRECT 125 (H) 10/09/2017 1605      Wt Readings from Last 3 Encounters:  10/20/21 151 lb (68.5 kg)  09/15/21 150 lb 9.6 oz (68.3 kg)  07/12/21 153 lb 12.8 oz (69.8 kg)      Other studies Reviewed: Additional studies/ records that were reviewed today include: None. Review of the above records demonstrates:  NA   ASSESSMENT AND PLAN:  BRADYCARDIA:    I am avoiding AV nodal blocking agents.  She has not had any symptoms related to this.  She has had no indication for pacing and she would let me know if she has any dizziness or lightheadedness going forward.  No change in therapy.  HTN: Her blood pressure is controlled and she wants to continue the meds as listed given her improvement in fatigue.  Therefore, we will make no change.   Current medicines are reviewed at length with the patient today.  The patient does not have concerns regarding medicines.  The following changes have been made: None  Labs/ tests ordered today include: None No orders of the defined types were placed in this encounter.    Disposition:   FU with me as needed.  Signed, Minus Breeding, MD  10/20/2021 4:09 PM    [facility]

## 2021-10-20 ENCOUNTER — Other Ambulatory Visit: Payer: Self-pay

## 2021-10-20 ENCOUNTER — Ambulatory Visit (INDEPENDENT_AMBULATORY_CARE_PROVIDER_SITE_OTHER): Payer: Medicare Other | Admitting: Cardiology

## 2021-10-20 ENCOUNTER — Encounter: Payer: Self-pay | Admitting: Cardiology

## 2021-10-20 VITALS — BP 142/80 | HR 76 | Ht 66.0 in | Wt 151.0 lb

## 2021-10-20 DIAGNOSIS — R001 Bradycardia, unspecified: Secondary | ICD-10-CM

## 2021-10-20 DIAGNOSIS — R5383 Other fatigue: Secondary | ICD-10-CM

## 2021-10-20 DIAGNOSIS — I10 Essential (primary) hypertension: Secondary | ICD-10-CM

## 2021-10-20 NOTE — Patient Instructions (Signed)
Medication Instructions:  The current medical regimen is effective;  continue present plan and medications.  *If you need a refill on your cardiac medications before your next appointment, please call your pharmacy*  Follow-Up: At [facility], you and your health needs are our priority.  As part of our continuing mission to provide you with exceptional heart care, we have created designated Provider Care Teams.  These Care Teams include your primary Cardiologist (physician) and Advanced Practice Providers (APPs -  Physician Assistants and Nurse Practitioners) who all work together to provide you with the care you need, when you need it.  We recommend signing up for the patient portal called "MyChart".  Sign up information is provided on this After Visit Summary.  MyChart is used to connect with patients for Virtual Visits (Telemedicine).  Patients are able to view lab/test results, encounter notes, upcoming appointments, etc.  Non-urgent messages can be sent to your provider as well.   To learn more about what you can do with MyChart, go to [email or web address].    Your next appointment:   Follow up as needed.  Thank you for choosing [facility]!!

## 2021-11-27 DIAGNOSIS — J029 Acute pharyngitis, unspecified: Secondary | ICD-10-CM | POA: Diagnosis not present

## 2021-11-27 DIAGNOSIS — B9689 Other specified bacterial agents as the cause of diseases classified elsewhere: Secondary | ICD-10-CM | POA: Diagnosis not present

## 2021-11-27 DIAGNOSIS — R062 Wheezing: Secondary | ICD-10-CM | POA: Diagnosis not present

## 2021-12-04 DIAGNOSIS — R070 Pain in throat: Secondary | ICD-10-CM | POA: Diagnosis not present

## 2021-12-04 DIAGNOSIS — J029 Acute pharyngitis, unspecified: Secondary | ICD-10-CM | POA: Diagnosis not present

## 2021-12-04 DIAGNOSIS — Z20822 Contact with and (suspected) exposure to covid-19: Secondary | ICD-10-CM | POA: Diagnosis not present

## 2021-12-04 DIAGNOSIS — R051 Acute cough: Secondary | ICD-10-CM | POA: Diagnosis not present

## 2021-12-20 ENCOUNTER — Ambulatory Visit (INDEPENDENT_AMBULATORY_CARE_PROVIDER_SITE_OTHER): Payer: Medicare Other

## 2021-12-20 ENCOUNTER — Telehealth: Payer: Self-pay

## 2021-12-20 VITALS — Ht 66.0 in | Wt 140.0 lb

## 2021-12-20 DIAGNOSIS — Z0000 Encounter for general adult medical examination without abnormal findings: Secondary | ICD-10-CM

## 2021-12-20 NOTE — Patient Instructions (Signed)
Ms. Garrow , ?Thank you for taking time to come for your Medicare Wellness Visit. I appreciate your ongoing commitment to your health goals. Please review the following plan we discussed and let me know if I can assist you in the future.  ? ?Screening recommendations/referrals: ?Colonoscopy: No longer required due to age.  ?Mammogram: 06/18/2021 Repeat annually ? ?Bone Density: Done 06/16/2021 Repeat every 2 years ? ?Recommended yearly ophthalmology/optometry visit for glaucoma screening and checkup ?Recommended yearly dental visit for hygiene and checkup ? ?Vaccinations: ?Influenza vaccine: Done 06/08/2021 Repeat annually ? ?Pneumococcal vaccine: Done 07/25/2016, 03/22/2016 and 05/29/2014 ?Tdap vaccine: Discussed. Repeat in 10 years ? ?Shingles vaccine: Discussed.   ?Covid-19:Done 10/14/2019, 11/11/2019 and 07/07/2020 ? ?Advanced directives: Please bring a copy of your health care power of attorney and living will to the office to be added to your chart at your convenience. ? ? ?Conditions/risks identified: Aim for 30 minutes of exercise or brisk walking, 6-8 glasses of water, and 5 servings of fruits and vegetables each day. ?KEEP UP THE GOOD WORK!! ? ?Next appointment: Follow up in one year for your annual wellness visit 12/26/22 @ 10:30 AM.  ? ? ?Preventive Care 80 Years and Older, Female ?Preventive care refers to lifestyle choices and visits with your health care provider that can promote health and wellness. ?What does preventive care include? ?A yearly physical exam. This is also called an annual well check. ?Dental exams once or twice a year. ?Routine eye exams. Ask your health care provider how often you should have your eyes checked. ?Personal lifestyle choices, including: ?Daily care of your teeth and gums. ?Regular physical activity. ?Eating a healthy diet. ?Avoiding tobacco and drug use. ?Limiting alcohol use. ?Practicing safe sex. ?Taking low-dose aspirin every day. ?Taking vitamin and mineral supplements as  recommended by your health care provider. ?What happens during an annual well check? ?The services and screenings done by your health care provider during your annual well check will depend on your age, overall health, lifestyle risk factors, and family history of disease. ?Counseling  ?Your health care provider may ask you questions about your: ?Alcohol use. ?Tobacco use. ?Drug use. ?Emotional well-being. ?Home and relationship well-being. ?Sexual activity. ?Eating habits. ?History of falls. ?Memory and ability to understand (cognition). ?Work and work Statistician. ?Reproductive health. ?Screening  ?You may have the following tests or measurements: ?Height, weight, and BMI. ?Blood pressure. ?Lipid and cholesterol levels. These may be checked every 5 years, or more frequently if you are over 92 years old. ?Skin check. ?Lung cancer screening. You may have this screening every year starting at age 48 if you have a 30-pack-year history of smoking and currently smoke or have quit within the past 15 years. ?Fecal occult blood test (FOBT) of the stool. You may have this test every year starting at age 58. ?Flexible sigmoidoscopy or colonoscopy. You may have a sigmoidoscopy every 5 years or a colonoscopy every 10 years starting at age 65. ?Hepatitis C blood test. ?Hepatitis B blood test. ?Sexually transmitted disease (STD) testing. ?Diabetes screening. This is done by checking your blood sugar (glucose) after you have not eaten for a while (fasting). You may have this done every 1-3 years. ?Bone density scan. This is done to screen for osteoporosis. You may have this done starting at age 90. ?Mammogram. This may be done every 1-2 years. Talk to your health care provider about how often you should have regular mammograms. ?Talk with your health care provider about your test results, treatment options,  and if necessary, the need for more tests. ?Vaccines  ?Your health care provider may recommend certain vaccines, such  as: ?Influenza vaccine. This is recommended every year. ?Tetanus, diphtheria, and acellular pertussis (Tdap, Td) vaccine. You may need a Td booster every 10 years. ?Zoster vaccine. You may need this after age 38. ?Pneumococcal 13-valent conjugate (PCV13) vaccine. One dose is recommended after age 56. ?Pneumococcal polysaccharide (PPSV23) vaccine. One dose is recommended after age 25. ?Talk to your health care provider about which screenings and vaccines you need and how often you need them. ?This information is not intended to replace advice given to you by your health care provider. Make sure you discuss any questions you have with your health care provider. ?Document Released: 09/11/2015 Document Revised: 05/04/2016 Document Reviewed: 06/16/2015 ?Elsevier Interactive Patient Education ? 2017 [employer]. ? ?Fall Prevention in the Home ?Falls can cause injuries. They can happen to people of all ages. There are many things you can do to make your home safe and to help prevent falls. ?What can I do on the outside of my home? ?Regularly fix the edges of walkways and driveways and fix any cracks. ?Remove anything that might make you trip as you walk through a door, such as a raised step or threshold. ?Trim any bushes or trees on the path to your home. ?Use bright outdoor lighting. ?Clear any walking paths of anything that might make someone trip, such as rocks or tools. ?Regularly check to see if handrails are loose or broken. Make sure that both sides of any steps have handrails. ?Any raised decks and porches should have guardrails on the edges. ?Have any leaves, snow, or ice cleared regularly. ?Use sand or salt on walking paths during winter. ?Clean up any spills in your garage right away. This includes oil or grease spills. ?What can I do in the bathroom? ?Use night lights. ?Install grab bars by the toilet and in the tub and shower. Do not use towel bars as grab bars. ?Use non-skid mats or decals in the tub or  shower. ?If you need to sit down in the shower, use a plastic, non-slip stool. ?Keep the floor dry. Clean up any water that spills on the floor as soon as it happens. ?Remove soap buildup in the tub or shower regularly. ?Attach bath mats securely with double-sided non-slip rug tape. ?Do not have throw rugs and other things on the floor that can make you trip. ?What can I do in the bedroom? ?Use night lights. ?Make sure that you have a light by your bed that is easy to reach. ?Do not use any sheets or blankets that are too big for your bed. They should not hang down onto the floor. ?Have a firm chair that has side arms. You can use this for support while you get dressed. ?Do not have throw rugs and other things on the floor that can make you trip. ?What can I do in the kitchen? ?Clean up any spills right away. ?Avoid walking on wet floors. ?Keep items that you use a lot in easy-to-reach places. ?If you need to reach something above you, use a strong step stool that has a grab bar. ?Keep electrical cords out of the way. ?Do not use floor polish or wax that makes floors slippery. If you must use wax, use non-skid floor wax. ?Do not have throw rugs and other things on the floor that can make you trip. ?What can I do with my stairs? ?Do not leave  any items on the stairs. ?Make sure that there are handrails on both sides of the stairs and use them. Fix handrails that are broken or loose. Make sure that handrails are as long as the stairways. ?Check any carpeting to make sure that it is firmly attached to the stairs. Fix any carpet that is loose or worn. ?Avoid having throw rugs at the top or bottom of the stairs. If you do have throw rugs, attach them to the floor with carpet tape. ?Make sure that you have a light switch at the top of the stairs and the bottom of the stairs. If you do not have them, ask someone to add them for you. ?What else can I do to help prevent falls? ?Wear shoes that: ?Do not have high heels. ?Have  rubber bottoms. ?Are comfortable and fit you well. ?Are closed at the toe. Do not wear sandals. ?If you use a stepladder: ?Make sure that it is fully opened. Do not climb a closed stepladder. ?Make sure that b

## 2021-12-20 NOTE — Telephone Encounter (Signed)
During AWV today, pt c/o her Tessalon Pearls not being strong enough for her cough and asks if something else could possibly be sent in? Thank you. ?

## 2021-12-20 NOTE — Progress Notes (Signed)
? ?Subjective:  ? Erin Khan is a 81 y.o. female who presents for Medicare Annual (Subsequent) preventive examination. ?Virtual Visit via Telephone Note ? ?I connected with  Erin Khan on 12/20/21 at 10:00 AM EDT by telephone and verified that I am speaking with the correct person using two identifiers. ? ?Location: ?Patient: HOME ?Provider: [facility] ?Persons participating in the virtual visit: patient/Nurse Health Advisor ?  ?I discussed the limitations, risks, security and privacy concerns of performing an evaluation and management service by telephone and the availability of in person appointments. The patient expressed understanding and agreed to proceed. ? ?Interactive audio and video telecommunications were attempted between this nurse and patient, however failed, due to patient having technical difficulties OR patient did not have access to video capability.  We continued and completed visit with audio only. ? ?Some vital signs may be absent or patient reported.  ? ?[name], LPN ? ?Review of Systems    ? ?Cardiac Risk Factors include: advanced age (>75mn, >>2women);hypertension;dyslipidemia;sedentary lifestyle ? ?   ?Objective:  ?  ?Today's Vitals  ? 12/20/21 1001  ?Weight: 140 lb (63.5 kg)  ?Height: '5\' 6"'$  (1.676 m)  ? ?Body mass index is 22.6 kg/m?. ? ? ?  12/20/2021  ? 10:10 AM 06/22/2021  ?  8:06 AM 12/15/2020  ?  8:47 AM 06/22/2020  ?  4:34 PM 05/21/2019  ?  1:47 PM 10/18/2018  ?  9:26 AM 01/15/2018  ?  3:02 PM  ?Advanced Directives  ?Does Patient Have a Medical Advance Directive? Yes Yes Yes No No No No  ?Type of [place]aramed[place]of [place]     ?Does patient want to make changes to medical advance directive?   No - Patient declined      ?Copy of [place] Chart? No - copy requested        ?Would patient like information on creating a medical advance directive?    No - Patient declined No - Patient declined No - Patient  declined Yes (MAU/Ambulatory/Procedural Areas - Information given)  ? ? ?Current Medications (verified) ?Outpatient Encounter Medications as of 12/20/2021  ?Medication Sig  ? albuterol (VENTOLIN HFA) 108 (90 Base) MCG/ACT inhaler Inhale into the lungs.  ? alendronate (FOSAMAX) 70 MG tablet Take 1 tablet (70 mg total) by mouth every 7 (seven) days. Take with a full glass of water on an empty stomach.  ? amLODipine (NORVASC) 5 MG tablet Take 1 tablet (5 mg total) by mouth daily.  ? benzonatate (TESSALON) 100 MG capsule Take by mouth.  ? Calcium Carbonate-Vitamin D (CALCIUM 600+D PO) Take 1 tablet by mouth daily.  ? Ergocalciferol (VITAMIN D2) 10 MCG (400 UNIT) TABS Take 400 Units by mouth daily.  ? ezetimibe (ZETIA) 10 MG tablet Take 1 tablet (10 mg total) by mouth daily.  ? lisinopril (ZESTRIL) 40 MG tablet Take 1 tablet (40 mg total) by mouth daily.  ? Omega-3 Fatty Acids (FISH OIL) 500 MG CAPS Take by mouth.  ? rosuvastatin (CRESTOR) 10 MG tablet Take 1 tablet (10 mg total) by mouth daily.  ? vitamin B-12 (CYANOCOBALAMIN) 500 MCG tablet Take 500 mcg by mouth daily.   ? [DISCONTINUED] cloNIDine (CATAPRES) 0.1 MG tablet Take 0.1 mg by mouth 2 (two) times daily.  ? [DISCONTINUED] hydrOXYzine (ATARAX/VISTARIL) 25 MG tablet Take 25 mg by mouth every 6 (six) hours as needed. (Patient not taking: Reported on 10/20/2021)  ? ?  No facility-administered encounter medications on file as of 12/20/2021.  ? ? ?Allergies (verified) ?Asa [aspirin] and Statins  ? ?History: ?Past Medical History:  ?Diagnosis Date  ? Cataract   ? Colon polyps   ? Hypercholesteremia   ? Hypertension   ? ?Past Surgical History:  ?Procedure Laterality Date  ? ABDOMINAL HYSTERECTOMY    ? BILATERAL OOPHORECTOMY    ? CATARACT EXTRACTION, BILATERAL  2014  ? CHOLECYSTECTOMY  1980  ? COLONOSCOPY    ? COLONOSCOPY N/A 05/15/2013  ? Procedure: COLONOSCOPY;  Surgeon: Rogene Houston, MD;  [facility];  Service: Endoscopy;  Laterality: N/A;  930  ?  COLONOSCOPY N/A 10/18/2018  ? Procedure: COLONOSCOPY;  Surgeon: Rogene Houston, MD;  [facility];  Service: Endoscopy;  Laterality: N/A;  830  ? cranial shunt    ? 17 years ago  ? POLYPECTOMY  10/18/2018  ? Procedure: POLYPECTOMY;  Surgeon: Rogene Houston, MD;  [facility]: [facility];  Service: Endoscopy;;  colon  ? ?Family History  ?Problem Relation Age of Onset  ? Hypertension Father   ? Lung cancer Sister   ? Arthritis Sister   ? Heart attack Brother 14  ? Heart disease Brother   ?     No details  ? Asthma Brother   ? Healthy Daughter   ? Healthy Son   ? Colon cancer Neg Hx   ? ?Social History  ? ?Socioeconomic History  ? Marital status: Married  ?  Spouse name: Erin Khan  ? Number of children: 2  ? Years of education: 39  ? Highest education level: 12th grade  ?Occupational History  ? 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 Occupation: [employer]  ?  Employer: [employer]  ?  Comment: 20 to 25 hours a week part time  ?Tobacco Use  ? Smoking status: Never  ?  Passive exposure: Never  ? Smokeless tobacco: Never  ?Vaping Use  ? Vaping Use: Never used  ?Substance and Sexual Activity  ? Alcohol use: No  ? Drug use: No  ? Sexual activity: Yes  ?Other Topics Concern  ? Not on file  ?Social History Narrative  ? She lives with her husband, Erin Khan.   She  has eight grandchildren.  ? ?Social Determinants of Health  ? ?Financial Resource Strain: Low Risk   ? Difficulty of Paying Living Expenses: Not hard at all  ?Food Insecurity: No Food Insecurity  ? Worried About Charity fundraiser in the Last Year: Never true  ? Ran Out of Food in the Last Year: Never true  ?Transportation Needs: No Transportation Needs  ? Lack of Transportation (Medical): No  ? Lack of Transportation (Non-Medical): No  ?Physical Activity: Sufficiently Active  ? Days of Exercise per Week: 5 days  ? Minutes of Exercise per Session: 30 min  ?Stress: No Stress Concern Present  ? Feeling of Stress : Not at all  ?Social Connections:  Socially Integrated  ? Frequency of Communication with Friends and Family: More than three times a week  ? Frequency of Social Gatherings with Friends and Family: More than three times a week  ? Attends Religious Services: More than 4 times per year  ? Active Member of Clubs or Organizations: Yes  ? Attends Archivist Meetings: More than 4 times per year  ? Marital Status: Married  ? ? ?Tobacco Counseling ?Counseling given: Not Answered ? ? ?Clinical Intake: ? ?Pre-visit preparation completed: Yes ? ?  Pain : No/denies pain ? ?  ? ?BMI - recorded: 22.6 ?Nutritional Status: BMI of 19-24  Normal ?Nutritional Risks: None ?Diabetes: No ? ?How often do you need to have someone help you when you read instructions, pamphlets, or other written materials from your doctor or pharmacy?: 1 - Never ? ?Diabetic?NO ? ?Interpreter Needed?: No ? ?Information entered by :: mj Konya Fauble, lpn ? ? ?Activities of Daily Living ? ?  12/20/2021  ? 10:13 AM  ?In your present state of health, do you have any difficulty performing the following activities:  ?Hearing? 0  ?Vision? 0  ?Difficulty concentrating or making decisions? 0  ?Walking or climbing stairs? 0  ?Dressing or bathing? 0  ?Doing errands, shopping? 0  ?Preparing Food and eating ? N  ?Using the Toilet? N  ?In the past six months, have you accidently leaked urine? N  ?Do you have problems with loss of bowel control? N  ?Managing your Medications? N  ?Managing your Finances? N  ?Housekeeping or managing your Housekeeping? N  ? ? ?Patient Care Team: ?Sharion Balloon, FNP as PCP - General (Family Medicine) ?Paula Compton, MD as Consulting Physician (Obstetrics and Gynecology) ?Rogene Houston, MD as Consulting Physician (Gastroenterology) ?Ilean China, RN as Equities trader ?Lavera Guise, Gayle Mill as [facility] (Pharmacist) ? ?Indicate any recent Medical Services you may have received from other than [facility] providers in the past year (date may  be approximate). ? ?   ?Assessment:  ? This is a routine wellness examination for Tarisa. ? ?Hearing/Vision screen ?Hearing Screening - Comments:: No hearing issues.  ?Vision Screening - Comments:: Glasses. Dr. Radford Pax. 2021

## 2021-12-20 NOTE — Telephone Encounter (Signed)
Please schedule her a virtual visit for cough.  ?

## 2021-12-20 NOTE — Telephone Encounter (Signed)
Patient aware - appt made 

## 2021-12-21 ENCOUNTER — Ambulatory Visit (INDEPENDENT_AMBULATORY_CARE_PROVIDER_SITE_OTHER): Payer: Medicare Other | Admitting: Family

## 2021-12-21 ENCOUNTER — Encounter: Payer: Self-pay | Admitting: Family

## 2021-12-21 DIAGNOSIS — J209 Acute bronchitis, unspecified: Secondary | ICD-10-CM | POA: Diagnosis not present

## 2021-12-21 MED ORDER — FLUTICASONE PROPIONATE 50 MCG/ACT NA SUSP: 2.0000 | Freq: Every day | NASAL | 6 refills | Status: DC

## 2021-12-21 MED ORDER — PREDNISONE 10 MG (21) PO TBPK: ORAL_TABLET | ORAL | 0 refills | Status: DC

## 2021-12-21 MED ORDER — CETIRIZINE HCL 10 MG PO TABS: 10.0000 mg | ORAL_TABLET | Freq: Every day | ORAL | 11 refills | Status: DC

## 2021-12-21 NOTE — Progress Notes (Signed)
? ?Virtual Visit  Note ?Due to COVID-19 pandemic this visit was conducted virtually. This visit type was conducted due to national recommendations for restrictions regarding the COVID-19 Pandemic (e.g. social distancing, sheltering in place) in an effort to limit this patient's exposure and mitigate transmission in our community. All issues noted in this document were discussed and addressed.  A physical exam was not performed with this format. ? ?I connected with Erin Khan on 12/21/21 at 10:55 AM  by telephone and verified that I am speaking with the correct person using two identifiers. Erin Khan is currently located at home and no one is currently with her during visit. The provider, Evelina Dun, FNP is located in their office at time of visit. ? ?I discussed the limitations, risks, security and privacy concerns of performing an evaluation and management service by telephone and the availability of in person appointments. I also discussed with the patient that there may be a patient responsible charge related to this service. The patient expressed understanding and agreed to proceed. ? ?Erin Khan,you are scheduled for a virtual visit with your provider today.   ? ?Just as we do with appointments in the office, we must obtain your consent to participate.  Your consent will be active for this visit and any virtual visit you may have with one of our providers in the next 365 days.   ? ?If you have a MyChart account, I can also send a copy of this consent to you electronically.  All virtual visits are billed to your insurance company just like a traditional visit in the office.  As this is a virtual visit, video technology does not allow for your provider to perform a traditional examination.  This may limit your provider's ability to fully assess your condition.  If your provider identifies any concerns that need to be evaluated in person or the need to arrange testing such as labs, EKG, etc, we will make  arrangements to do so.   ? ?Although advances in technology are sophisticated, we cannot ensure that it will always work on either your end or our end.  If the connection with a video visit is poor, we may have to switch to a telephone visit.  With either a video or telephone visit, we are not always able to ensure that we have a secure connection.   I need to obtain your verbal consent now.   Are you willing to proceed with your visit today?  ? ?Erin Khan has provided verbal consent on 12/21/2021 for a virtual visit (video or telephone). ? ? ?Evelina Dun, FNP ?12/21/2021  11:02 AM ? ? ? ?History and Present Illness: ? ?Cough ?This is a new problem. The current episode started 1 to 4 weeks ago. The problem has been gradually worsening. The problem occurs every few minutes. The cough is Non-productive. Pertinent negatives include no chills, ear congestion, ear pain, fever, headaches, myalgias, nasal congestion, postnasal drip, shortness of breath or wheezing. She has tried rest and OTC cough suppressant for the symptoms. The treatment provided no relief.  ? ? ? ?Review of Systems  ?Constitutional:  Negative for chills and fever.  ?HENT:  Negative for ear pain and postnasal drip.   ?Respiratory:  Positive for cough. Negative for shortness of breath and wheezing.   ?Musculoskeletal:  Negative for myalgias.  ?Neurological:  Negative for headaches.  ? ? ?Observations/Objective: ?No SOB or distress noted, dry cough noted ? ?Assessment and Plan: ?1. Acute  bronchitis, unspecified organism ?- Take meds as prescribed ?- Use a cool mist humidifier  ?-Use saline nose sprays frequently ?-Force fluids ?-For any cough or congestion ? Use plain Mucinex- regular strength or max strength is fine ?-For fever or aces or pains- take tylenol or ibuprofen. ?-Throat lozenges if help ?-New toothbrush in 3 days ?- predniSONE (STERAPRED UNI-PAK 21 TAB) 10 MG (21) TBPK tablet; Use as directed  Dispense: 21 tablet; Refill: 0 ?- cetirizine  (ZYRTEC) 10 MG tablet; Take 1 tablet (10 mg total) by mouth daily.  Dispense: 30 tablet; Refill: 11 ?- fluticasone (FLONASE) 50 MCG/ACT nasal spray; Place 2 sprays into both nostrils daily.  Dispense: 16 g; Refill: 6 ? ? ?  ?I discussed the assessment and treatment plan with the patient. The patient was provided an opportunity to ask questions and all were answered. The patient agreed with the plan and demonstrated an understanding of the instructions. ?  ?The patient was advised to call back or seek an in-person evaluation if the symptoms worsen or if the condition fails to improve as anticipated. ? ?The above assessment and management plan was discussed with the patient. The patient verbalized understanding of and has agreed to the management plan. Patient is aware to call the clinic if symptoms persist or worsen. Patient is aware when to return to the clinic for a follow-up visit. Patient educated on when it is appropriate to go to the emergency department.  ? ?Time call ended:  11:06 AM  ? ?I provided 11 minutes of  non face-to-face time during this encounter. ? ? ? ?Evelina Dun, FNP ? ? ?

## 2022-01-03 ENCOUNTER — Other Ambulatory Visit: Payer: Self-pay | Admitting: Family

## 2022-01-03 DIAGNOSIS — E782 Mixed hyperlipidemia: Secondary | ICD-10-CM

## 2022-01-08 IMAGING — MG DIGITAL SCREENING BILAT W/ TOMO W/ CAD
8 series · 8 of 24 positions shown · non-contrast
Comparison: Previous exam(s).

CLINICAL DATA: Screening.

EXAM:
DIGITAL SCREENING BILATERAL MAMMOGRAM WITH TOMO AND CAD

[R MLO synth-2D]
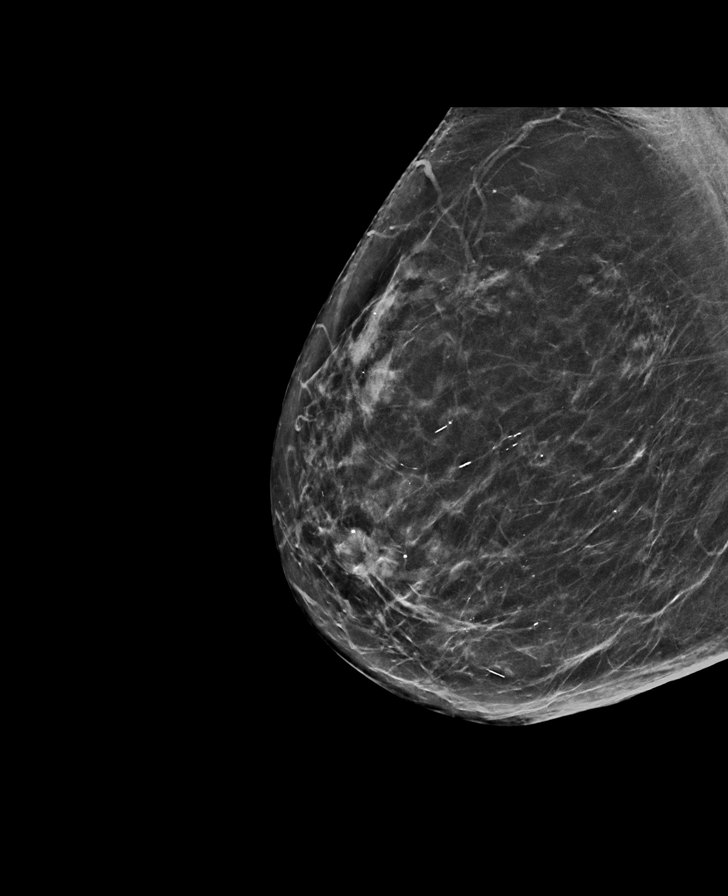

[L CC synth-2D]
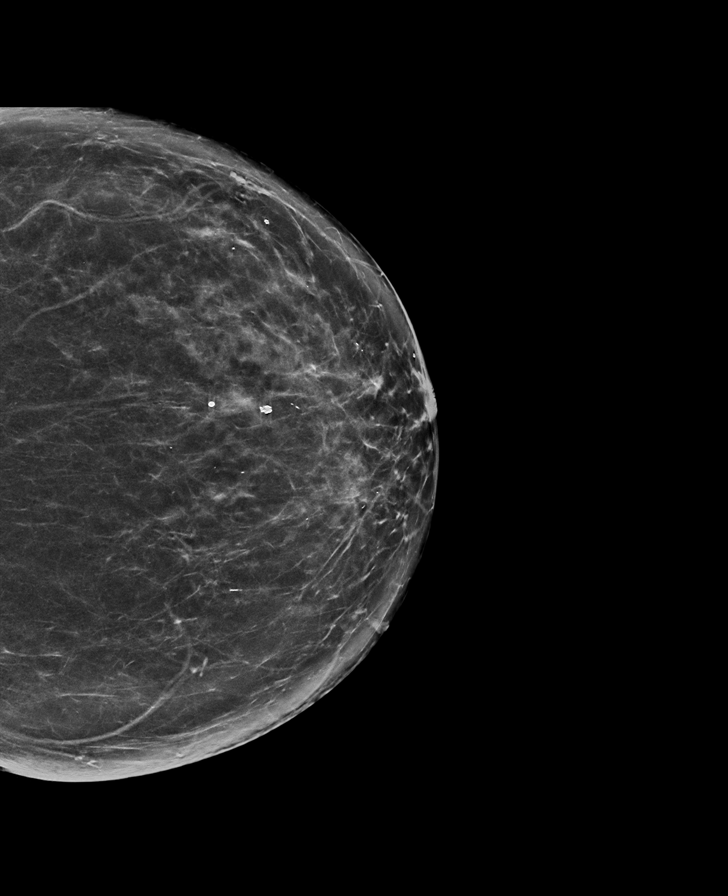

[L MLO synth-2D]
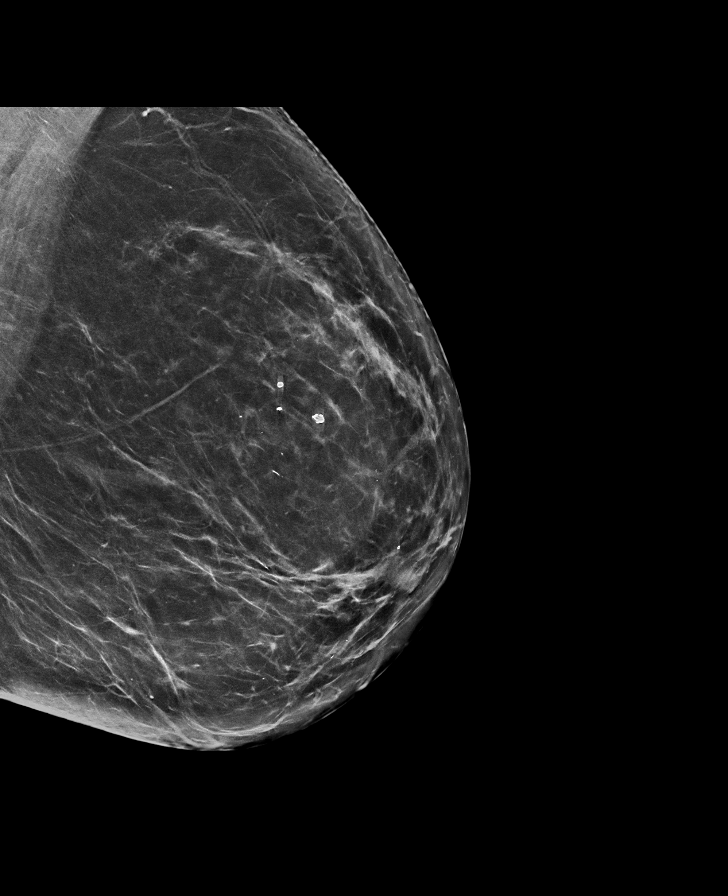

[R CC synth-2D]
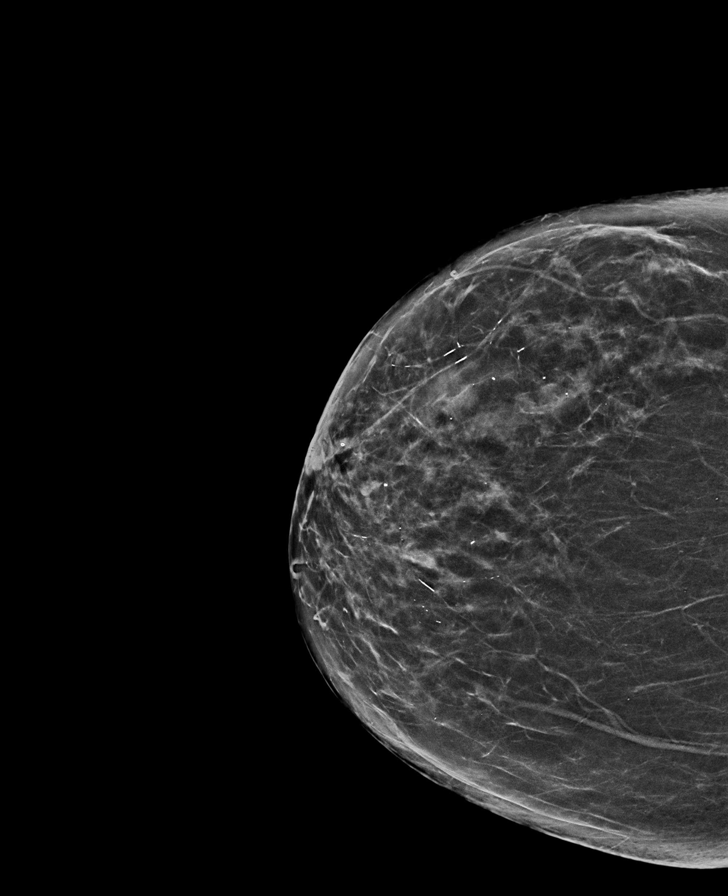

[L CC tomo · tomo slice 39/76.0]
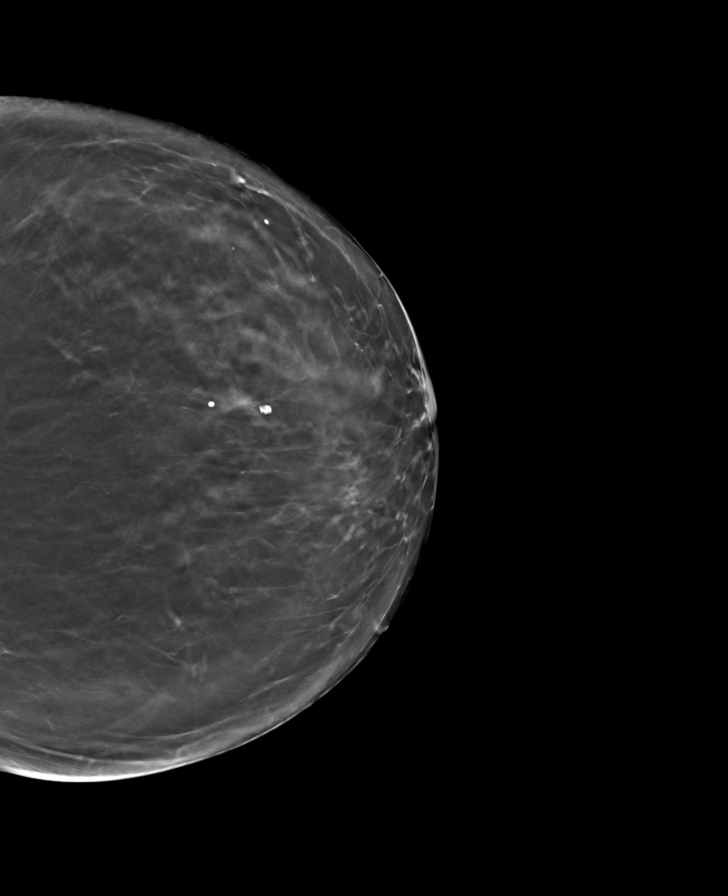

[L MLO tomo · tomo slice 41/80.0]
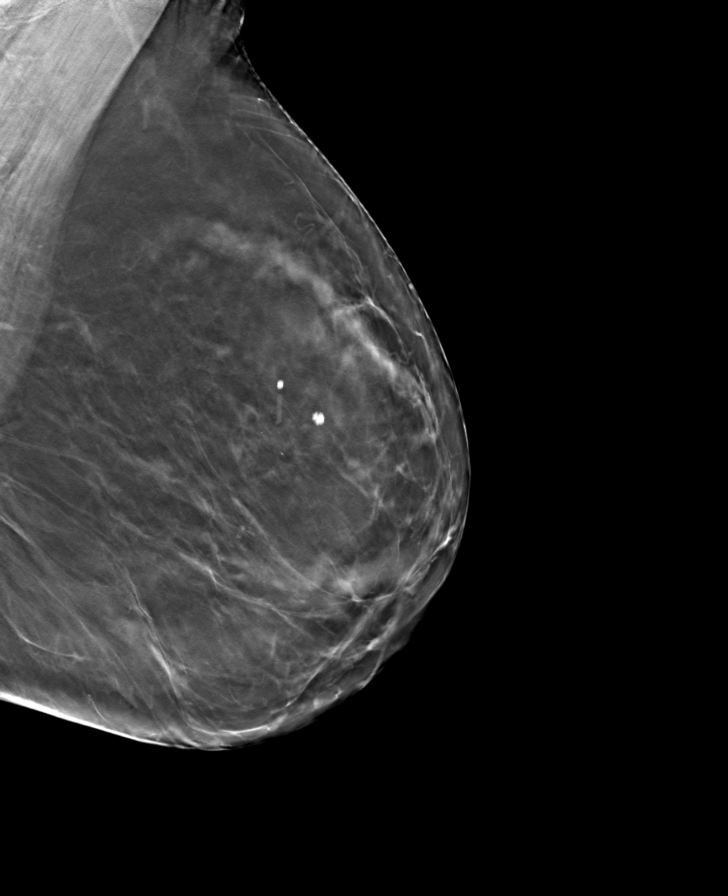

[R CC tomo · tomo slice 34/67.0]
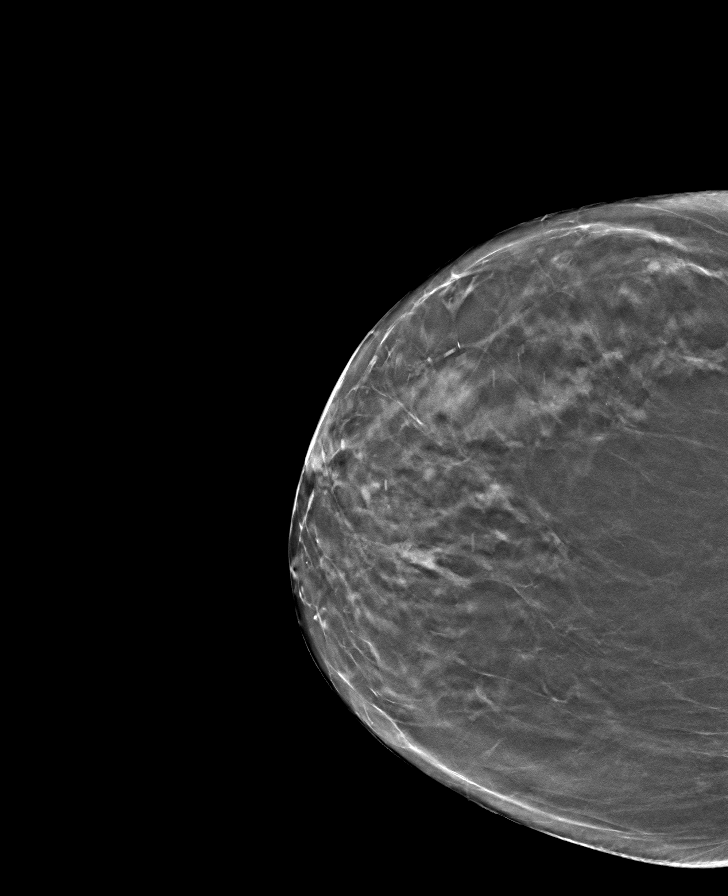

[R MLO tomo · tomo slice 37/72.0]
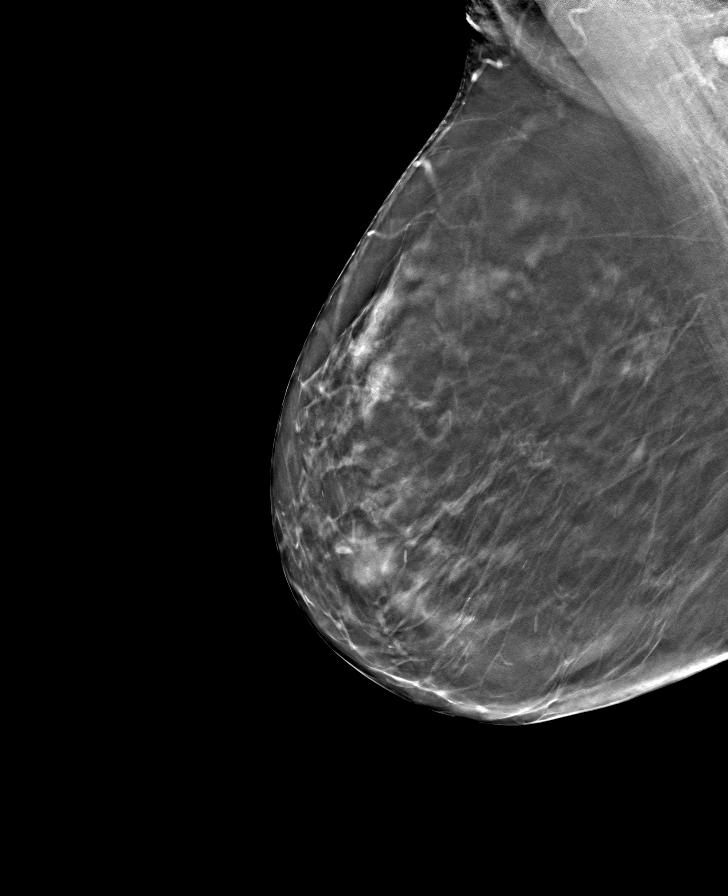

[8 of 24 positions shown; findings below may reference images not displayed]

ACR Breast Density Category b: There are scattered areas of
fibroglandular density.
FINDINGS: There are no findings suspicious for malignancy. Images were
processed with CAD.
IMPRESSION: No mammographic evidence of malignancy. A result letter of this
screening mammogram will be mailed directly to the patient.

RECOMMENDATION:
Screening mammogram in one year. (Code:[id number])

BI-RADS CATEGORY  1: Negative.

## 2022-01-12 DIAGNOSIS — Z960 Presence of urogenital implants: Secondary | ICD-10-CM | POA: Diagnosis not present

## 2022-01-22 ENCOUNTER — Other Ambulatory Visit: Payer: Self-pay | Admitting: Family

## 2022-01-27 DIAGNOSIS — Z960 Presence of urogenital implants: Secondary | ICD-10-CM | POA: Diagnosis not present

## 2022-02-21 ENCOUNTER — Ambulatory Visit (INDEPENDENT_AMBULATORY_CARE_PROVIDER_SITE_OTHER): Payer: Medicare Other | Admitting: Family

## 2022-02-21 ENCOUNTER — Encounter: Payer: Self-pay | Admitting: Family

## 2022-02-21 VITALS — BP 126/72 | HR 70 | Temp 97.9°F | Ht 66.0 in | Wt 151.4 lb

## 2022-02-21 DIAGNOSIS — Z960 Presence of urogenital implants: Secondary | ICD-10-CM | POA: Diagnosis not present

## 2022-02-21 DIAGNOSIS — E781 Pure hyperglyceridemia: Secondary | ICD-10-CM

## 2022-02-21 DIAGNOSIS — R5383 Other fatigue: Secondary | ICD-10-CM

## 2022-02-21 DIAGNOSIS — I10 Essential (primary) hypertension: Secondary | ICD-10-CM

## 2022-02-21 DIAGNOSIS — N183 Chronic kidney disease, stage 3 unspecified: Secondary | ICD-10-CM | POA: Diagnosis not present

## 2022-02-21 DIAGNOSIS — M810 Age-related osteoporosis without current pathological fracture: Secondary | ICD-10-CM | POA: Diagnosis not present

## 2022-02-21 DIAGNOSIS — N819 Female genital prolapse, unspecified: Secondary | ICD-10-CM

## 2022-02-21 DIAGNOSIS — E785 Hyperlipidemia, unspecified: Secondary | ICD-10-CM | POA: Diagnosis not present

## 2022-02-21 MED ORDER — ROSUVASTATIN CALCIUM 10 MG PO TABS: 10.0000 mg | ORAL_TABLET | Freq: Every day | ORAL | 2 refills | Status: DC

## 2022-02-22 LAB — CBC WITH DIFFERENTIAL/PLATELET
Basophils Absolute: 0.1 10*3/uL (ref 0.0–0.2)
Basos: 1 %
EOS (ABSOLUTE): 0.2 10*3/uL (ref 0.0–0.4)
Eos: 3 %
Hematocrit: 36.8 % (ref 34.0–46.6)
Hemoglobin: 12.6 g/dL (ref 11.1–15.9)
Immature Grans (Abs): 0 10*3/uL (ref 0.0–0.1)
Immature Granulocytes: 0 %
Lymphocytes Absolute: 1.5 10*3/uL (ref 0.7–3.1)
Lymphs: 24 %
MCH: 31.7 pg (ref 26.6–33.0)
MCHC: 34.2 g/dL (ref 31.5–35.7)
MCV: 93 fL (ref 79–97)
Monocytes Absolute: 0.8 10*3/uL (ref 0.1–0.9)
Monocytes: 13 %
Neutrophils Absolute: 3.7 10*3/uL (ref 1.4–7.0)
Neutrophils: 59 %
Platelets: 203 10*3/uL (ref 150–450)
RBC: 3.98 x10E6/uL (ref 3.77–5.28)
RDW: 12.2 % (ref 11.7–15.4)
WBC: 6.3 10*3/uL (ref 3.4–10.8)

## 2022-02-22 LAB — CMP14+EGFR
ALT: 12 IU/L (ref 0–32)
AST: 10 IU/L (ref 0–40)
Albumin/Globulin Ratio: 1.7 (ref 1.2–2.2)
Albumin: 4.1 g/dL (ref 3.7–4.7)
Alkaline Phosphatase: 56 IU/L (ref 44–121)
BUN/Creatinine Ratio: 21 (ref 12–28)
BUN: 26 mg/dL (ref 8–27)
Bilirubin Total: 0.3 mg/dL (ref 0.0–1.2)
CO2: 20 mmol/L (ref 20–29)
Calcium: 9.5 mg/dL (ref 8.7–10.3)
Chloride: 105 mmol/L (ref 96–106)
Creatinine, Ser: 1.24 mg/dL — ABNORMAL HIGH (ref 0.57–1.00)
Globulin, Total: 2.4 g/dL (ref 1.5–4.5)
Glucose: 88 mg/dL (ref 70–99)
Potassium: 4.4 mmol/L (ref 3.5–5.2)
Sodium: 141 mmol/L (ref 134–144)
Total Protein: 6.5 g/dL (ref 6.0–8.5)
eGFR: 44 mL/min/{1.73_m2} — ABNORMAL LOW (ref >59)

## 2022-02-22 LAB — LIPID PANEL
Chol/HDL Ratio: 3.6 ratio (ref 0.0–4.4)
Cholesterol, Total: 131 mg/dL (ref 100–199)
HDL: 36 mg/dL — ABNORMAL LOW (ref >39)
LDL Chol Calc (NIH): 57 mg/dL (ref 0–99)
Triglycerides: 238 mg/dL — ABNORMAL HIGH (ref 0–149)
VLDL Cholesterol Cal: 38 mg/dL (ref 5–40)

## 2022-02-22 LAB — TSH: TSH: 0.519 u[IU]/mL (ref 0.450–4.500)

## 2022-03-08 ENCOUNTER — Ambulatory Visit: Payer: Self-pay | Admitting: *Deleted

## 2022-03-08 NOTE — Chronic Care Management (AMB) (Signed)
  Chronic Care Management   Note  03/08/2022 Name: Erin Khan MRN: [id number] DOB: June 27, 1941   Patient has not recently engaged with the Chronic Care Management RN Care Manager. Removing RN Care Manager from Care Team and closing [facility]. If patient is currently engaged with another CCM team member I will forward this encounter to inform them of my case closure. Patient may be eligible for re-engagement with RN Care Manager in the future if necessary and can discuss this with their PCP.  Chong Sicilian, BSN, RN-BC Embedded Chronic Care Manager [facility] Direct Dial: [phone]

## 2022-05-11 ENCOUNTER — Other Ambulatory Visit (HOSPITAL_COMMUNITY): Payer: Self-pay | Admitting: Obstetrics and Gynecology

## 2022-05-11 DIAGNOSIS — Z1231 Encounter for screening mammogram for malignant neoplasm of breast: Secondary | ICD-10-CM

## 2022-05-24 ENCOUNTER — Telehealth: Payer: Self-pay | Admitting: Family

## 2022-05-24 ENCOUNTER — Encounter: Payer: Self-pay | Admitting: Family

## 2022-05-24 DIAGNOSIS — M810 Age-related osteoporosis without current pathological fracture: Secondary | ICD-10-CM

## 2022-05-24 MED ORDER — ALENDRONATE SODIUM 70 MG PO TABS: 70.0000 mg | ORAL_TABLET | ORAL | 0 refills | Status: DC

## 2022-05-24 NOTE — Telephone Encounter (Signed)
Pt aware refill sent to pharmacy, 6 mos ckup sched for Dec

## 2022-05-24 NOTE — Telephone Encounter (Signed)
  Prescription Request  05/24/2022  Is this a "Controlled Substance" medicine? no  Have you seen your PCP in the last 2 weeks? no  If YES, route message to pool  -  If NO, patient needs to be scheduled for appointment.  What is the name of the medication or equipment? alendronate (FOSAMAX) 70 MG tablet  Have you contacted your pharmacy to request a refill? yes   Which pharmacy would you like this sent to? [facility] in [place]   Patient notified that their request is being sent to the clinical staff for review and that they should receive a response within 2 business days.

## 2022-06-20 ENCOUNTER — Ambulatory Visit (HOSPITAL_COMMUNITY)
Admission: RE | Admit: 2022-06-20 | Discharge: 2022-06-20 | Disposition: A | Discharge status: Home / Self Care | Payer: Medicare Other | Source: Ambulatory Visit | Attending: Obstetrics and Gynecology | Admitting: Obstetrics and Gynecology

## 2022-06-20 DIAGNOSIS — Z1231 Encounter for screening mammogram for malignant neoplasm of breast: Secondary | ICD-10-CM | POA: Insufficient documentation

## 2022-06-28 DIAGNOSIS — Z960 Presence of urogenital implants: Secondary | ICD-10-CM | POA: Diagnosis not present

## 2022-08-25 ENCOUNTER — Ambulatory Visit: Payer: Medicare Other | Admitting: Family

## 2022-09-03 ENCOUNTER — Other Ambulatory Visit: Payer: Self-pay | Admitting: Family

## 2022-09-03 DIAGNOSIS — I10 Essential (primary) hypertension: Secondary | ICD-10-CM

## 2022-09-21 ENCOUNTER — Other Ambulatory Visit: Payer: Self-pay | Admitting: Cardiology

## 2022-09-27 ENCOUNTER — Other Ambulatory Visit: Payer: Self-pay

## 2022-09-27 DIAGNOSIS — M810 Age-related osteoporosis without current pathological fracture: Secondary | ICD-10-CM

## 2022-09-27 MED ORDER — ALENDRONATE SODIUM 70 MG PO TABS: 70.0000 mg | ORAL_TABLET | ORAL | 0 refills | Status: DC

## 2022-10-04 DIAGNOSIS — Z960 Presence of urogenital implants: Secondary | ICD-10-CM | POA: Diagnosis not present

## 2022-10-07 ENCOUNTER — Other Ambulatory Visit: Payer: Self-pay | Admitting: *Deleted

## 2022-10-07 DIAGNOSIS — I10 Essential (primary) hypertension: Secondary | ICD-10-CM

## 2022-10-07 MED ORDER — LISINOPRIL 40 MG PO TABS: 40.0000 mg | ORAL_TABLET | Freq: Every day | ORAL | 0 refills | Status: DC

## 2022-10-10 ENCOUNTER — Ambulatory Visit (INDEPENDENT_AMBULATORY_CARE_PROVIDER_SITE_OTHER): Payer: Medicare Other | Admitting: Family

## 2022-10-10 ENCOUNTER — Encounter: Payer: Self-pay | Admitting: Family

## 2022-10-10 VITALS — BP 136/74 | HR 98 | Temp 97.0°F | Ht 66.0 in | Wt 150.8 lb

## 2022-10-10 DIAGNOSIS — E781 Pure hyperglyceridemia: Secondary | ICD-10-CM | POA: Diagnosis not present

## 2022-10-10 DIAGNOSIS — M810 Age-related osteoporosis without current pathological fracture: Secondary | ICD-10-CM

## 2022-10-10 DIAGNOSIS — Z960 Presence of urogenital implants: Secondary | ICD-10-CM

## 2022-10-10 DIAGNOSIS — I10 Essential (primary) hypertension: Secondary | ICD-10-CM | POA: Diagnosis not present

## 2022-10-10 DIAGNOSIS — E785 Hyperlipidemia, unspecified: Secondary | ICD-10-CM

## 2022-10-10 DIAGNOSIS — N183 Chronic kidney disease, stage 3 unspecified: Secondary | ICD-10-CM | POA: Diagnosis not present

## 2022-10-10 DIAGNOSIS — Z0001 Encounter for general adult medical examination with abnormal findings: Secondary | ICD-10-CM

## 2022-10-10 DIAGNOSIS — I129 Hypertensive chronic kidney disease with stage 1 through stage 4 chronic kidney disease, or unspecified chronic kidney disease: Secondary | ICD-10-CM | POA: Diagnosis not present

## 2022-10-10 DIAGNOSIS — E782 Mixed hyperlipidemia: Secondary | ICD-10-CM

## 2022-10-10 DIAGNOSIS — Z0000 Encounter for general adult medical examination without abnormal findings: Secondary | ICD-10-CM | POA: Diagnosis not present

## 2022-10-10 DIAGNOSIS — N819 Female genital prolapse, unspecified: Secondary | ICD-10-CM

## 2022-10-10 DIAGNOSIS — Z23 Encounter for immunization: Secondary | ICD-10-CM

## 2022-10-10 LAB — LIPID PANEL

## 2022-10-10 MED ORDER — EZETIMIBE 10 MG PO TABS: 10.0000 mg | ORAL_TABLET | Freq: Every day | ORAL | 3 refills | Status: DC

## 2022-10-10 NOTE — Progress Notes (Signed)
Subjective:    Patient ID: Erin Khan, female    DOB: 1940-12-26, 82 y.o.   MRN: [id number]  Chief Complaint  Patient presents with   Medical Management of Chronic Issues    PT presents to the office today for CPE and  chronic follow up. She is followed by GYN every 6 months for pessary maintenance. She is also complaining of fatigue     She has CKD and avoids NSAID's.   She has osteoporosis and takes Fosamax weekly. Her last Dexa scan was 06/15/21. Hypertension This is a chronic problem. The current episode started more than 1 year ago. The problem has been resolved since onset. The problem is controlled. Pertinent negatives include no malaise/fatigue, peripheral edema or shortness of breath. Risk factors for coronary artery disease include dyslipidemia and sedentary lifestyle. The current treatment provides moderate improvement.  Hyperlipidemia This is a chronic problem. The current episode started more than 1 year ago. The problem is controlled. Recent lipid tests were reviewed and are normal. Pertinent negatives include no shortness of breath. Current antihyperlipidemic treatment includes ezetimibe. The current treatment provides moderate improvement of lipids. Risk factors for coronary artery disease include dyslipidemia, hypertension, a sedentary lifestyle and post-menopausal.      Review of Systems  Constitutional:  Negative for malaise/fatigue.  Respiratory:  Negative for shortness of breath.   All other systems reviewed and are negative.  Family History  Problem Relation Age of Onset   Hypertension Father    Lung cancer Sister    Arthritis Sister    Heart attack Brother 41   Heart disease Brother        No details   Asthma Brother    Healthy Daughter    Healthy Son    Colon cancer Neg Hx    Social History   Socioeconomic History   Marital status: Married    Spouse name: Collins [place]   Number of children: 2   Years of education: 12   Highest education level: 12th  grade  Occupational History   Occupation: Retired    Comment: Environmental health practitioner   Occupation: Garden Chiropodist: [employer]    Comment: 20 to 25 hours a week part time  Tobacco Use   Smoking status: Never    Passive exposure: Never   Smokeless tobacco: Never  Vaping Use   Vaping Use: Never used  Substance and Sexual Activity   Alcohol use: No   Drug use: No   Sexual activity: Yes  Other Topics Concern   Not on file  Social History Narrative   She lives with her husband, Collins [place].   She  has eight grandchildren.   Social Determinants of Health   Financial Resource Strain: Low Risk  (12/20/2021)   Overall Financial Resource Strain (CARDIA)    Difficulty of Paying Living Expenses: Not hard at all  Food Insecurity: No Food Insecurity (12/20/2021)   Hunger Vital Sign    Worried About Running Out of Food in the Last Year: Never true    Ran Out of Food in the Last Year: Never true  Transportation Needs: No Transportation Needs (12/20/2021)   PRAPARE - Hydrologist (Medical): No    Lack of Transportation (Non-Medical): No  Physical Activity: Sufficiently Active (12/20/2021)   Exercise Vital Sign    Days of Exercise per Week: 5 days    Minutes of Exercise per Session: 30 min  Stress: No Stress Concern Present (12/20/2021)   [place]  [facility]    Feeling of Stress : Not at all  Social Connections: Socially Integrated (12/20/2021)   Social Connection and Isolation Panel [NHANES]    Frequency of Communication with Friends and Family: More than three times a week    Frequency of Social Gatherings with Friends and Family: More than three times a week    Attends Religious Services: More than 4 times per year    Active Member of [employer] or Organizations: Yes    Attends Music therapist: More than 4 times per year    Marital Status: Married       Objective:   Physical Exam Vitals reviewed.   Constitutional:      General: She is not in acute distress.    Appearance: She is well-developed.  HENT:     Head: Normocephalic and atraumatic.     Right Ear: Tympanic membrane normal.     Left Ear: Tympanic membrane normal.  Eyes:     Pupils: Pupils are equal, round, and reactive to light.  Neck:     Thyroid: No thyromegaly.  Cardiovascular:     Rate and Rhythm: Normal rate and regular rhythm.     Heart sounds: Normal heart sounds. No murmur heard. Pulmonary:     Effort: Pulmonary effort is normal. No respiratory distress.     Breath sounds: Normal breath sounds. No wheezing.  Abdominal:     General: Bowel sounds are normal. There is no distension.     Palpations: Abdomen is soft.     Tenderness: There is no abdominal tenderness.  Musculoskeletal:        General: No tenderness. Normal range of motion.     Cervical back: Normal range of motion and neck supple.  Skin:    General: Skin is warm and dry.  Neurological:     Mental Status: She is alert and oriented to person, place, and time.     Cranial Nerves: No cranial nerve deficit.     Deep Tendon Reflexes: Reflexes are normal and symmetric.  Psychiatric:        Behavior: Behavior normal.        Thought Content: Thought content normal.        Judgment: Judgment normal.       BP 136/74   Pulse 98   Temp (!) 97 F (36.1 C) (Temporal)   Ht 5' 6"$  (1.676 m)   Wt 150 lb 12.8 oz (68.4 kg)   BMI 24.34 kg/m      Assessment & Plan:  Erin Khan comes in today with chief complaint of Medical Management of Chronic Issues   Diagnosis and orders addressed:  1. Primary hypertension - CMP14+EGFR  2. Hyperlipidemia, unspecified hyperlipidemia type - CMP14+EGFR - Lipid panel  3. Age-related osteoporosis without current pathological fracture - CMP14+EGFR - VITAMIN D 25 Hydroxy (Vit-D Deficiency, Fractures)  4. Stage 3 chronic kidney disease, unspecified whether stage 3a or 3b CKD (HCC) - CMP14+EGFR  5.  Hypertriglyceridemia - CMP14+EGFR - Lipid panel  6. Vaginal vault prolapse - CMP14+EGFR  7. Presence of pessary - CMP14+EGFR  8. Mixed hyperlipidemia - ezetimibe (ZETIA) 10 MG tablet; Take 1 tablet (10 mg total) by mouth daily.  Dispense: 90 tablet; Refill: 3 - CMP14+EGFR  9. Annual physical exam - CMP14+EGFR - CBC with Differential/Platelet - Lipid panel - TSH - VITAMIN D 25 Hydroxy (Vit-D Deficiency, Fractures)   Labs pending Health Maintenance reviewed Diet and exercise encouraged  Follow up plan: 6 months   Evelina Dun, FNP

## 2022-10-10 NOTE — Patient Instructions (Signed)
Health Maintenance After Age 81 After age 81, you are at a higher risk for certain long-term diseases and infections as well as injuries from falls. Falls are a major cause of broken bones and head injuries in people who are older than age 81. Getting regular preventive care can help to keep you healthy and well. Preventive care includes getting regular testing and making lifestyle changes as recommended by your health care provider. Talk with your health care provider about: Which screenings and tests you should have. A screening is a test that checks for a disease when you have no symptoms. A diet and exercise plan that is right for you. What should I know about screenings and tests to prevent falls? Screening and testing are the best ways to find a health problem early. Early diagnosis and treatment give you the best chance of managing medical conditions that are common after age 81. Certain conditions and lifestyle choices may make you more likely to have a fall. Your health care provider may recommend: Regular vision checks. Poor vision and conditions such as cataracts can make you more likely to have a fall. If you wear glasses, make sure to get your prescription updated if your vision changes. Medicine review. Work with your health care provider to regularly review all of the medicines you are taking, including over-the-counter medicines. Ask your health care provider about any side effects that may make you more likely to have a fall. Tell your health care provider if any medicines that you take make you feel dizzy or sleepy. Strength and balance checks. Your health care provider may recommend certain tests to check your strength and balance while standing, walking, or changing positions. Foot health exam. Foot pain and numbness, as well as not wearing proper footwear, can make you more likely to have a fall. Screenings, including: Osteoporosis screening. Osteoporosis is a condition that causes  the bones to get weaker and break more easily. Blood pressure screening. Blood pressure changes and medicines to control blood pressure can make you feel dizzy. Depression screening. You may be more likely to have a fall if you have a fear of falling, feel depressed, or feel unable to do activities that you used to do. Alcohol use screening. Using too much alcohol can affect your balance and may make you more likely to have a fall. Follow these instructions at home: Lifestyle Do not drink alcohol if: Your health care provider tells you not to drink. If you drink alcohol: Limit how much you have to: 0-1 drink a day for women. 0-2 drinks a day for men. Know how much alcohol is in your drink. In the [place] one drink equals one 12 oz bottle of beer (355 mL), one 5 oz glass of wine (148 mL), or one 1 oz glass of hard liquor (44 mL). Do not use any products that contain nicotine or tobacco. These products include cigarettes, chewing tobacco, and vaping devices, such as e-cigarettes. If you need help quitting, ask your health care provider. Activity  Follow a regular exercise program to stay fit. This will help you maintain your balance. Ask your health care provider what types of exercise are appropriate for you. If you need a cane or walker, use it as recommended by your health care provider. Wear supportive shoes that have nonskid soles. Safety  Remove any tripping hazards, such as rugs, cords, and clutter. Install safety equipment such as grab bars in bathrooms and safety rails on stairs. Keep rooms and walkways   well-lit. General instructions Talk with your health care provider about your risks for falling. Tell your health care provider if: You fall. Be sure to tell your health care provider about all falls, even ones that seem minor. You feel dizzy, tiredness (fatigue), or off-balance. Take over-the-counter and prescription medicines only as told by your health care provider. These include  supplements. Eat a healthy diet and maintain a healthy weight. A healthy diet includes low-fat dairy products, low-fat (lean) meats, and fiber from whole grains, beans, and lots of fruits and vegetables. Stay current with your vaccines. Schedule regular health, dental, and eye exams. Summary Having a healthy lifestyle and getting preventive care can help to protect your health and wellness after age 81. Screening and testing are the best way to find a health problem early and help you avoid having a fall. Early diagnosis and treatment give you the best chance for managing medical conditions that are more common for people who are older than age 81. Falls are a major cause of broken bones and head injuries in people who are older than age 81. Take precautions to prevent a fall at home. Work with your health care provider to learn what changes you can make to improve your health and wellness and to prevent falls. This information is not intended to replace advice given to you by your health care provider. Make sure you discuss any questions you have with your health care provider. Document Revised: 01/04/2021 Document Reviewed: 01/04/2021 Elsevier Patient Education  2023 Elsevier Inc.  

## 2022-10-11 LAB — CBC WITH DIFFERENTIAL/PLATELET
Basophils Absolute: 0.1 10*3/uL (ref 0.0–0.2)
Basos: 1 %
EOS (ABSOLUTE): 0.1 10*3/uL (ref 0.0–0.4)
Eos: 2 %
Hematocrit: 36.5 % (ref 34.0–46.6)
Hemoglobin: 12.5 g/dL (ref 11.1–15.9)
Immature Grans (Abs): 0 10*3/uL (ref 0.0–0.1)
Immature Granulocytes: 0 %
Lymphocytes Absolute: 1.5 10*3/uL (ref 0.7–3.1)
Lymphs: 21 %
MCH: 31.3 pg (ref 26.6–33.0)
MCHC: 34.2 g/dL (ref 31.5–35.7)
MCV: 91 fL (ref 79–97)
Monocytes Absolute: 1 10*3/uL — ABNORMAL HIGH (ref 0.1–0.9)
Monocytes: 14 %
Neutrophils Absolute: 4.6 10*3/uL (ref 1.4–7.0)
Neutrophils: 62 %
Platelets: 167 10*3/uL (ref 150–450)
RBC: 4 x10E6/uL (ref 3.77–5.28)
RDW: 11.7 % (ref 11.7–15.4)
WBC: 7.4 10*3/uL (ref 3.4–10.8)

## 2022-10-11 LAB — LIPID PANEL
Chol/HDL Ratio: 3.3 ratio (ref 0.0–4.4)
Cholesterol, Total: 126 mg/dL (ref 100–199)
HDL: 38 mg/dL — ABNORMAL LOW (ref >39)
LDL Chol Calc (NIH): 49 mg/dL (ref 0–99)
Triglycerides: 251 mg/dL — ABNORMAL HIGH (ref 0–149)
VLDL Cholesterol Cal: 39 mg/dL (ref 5–40)

## 2022-10-11 LAB — CMP14+EGFR
ALT: 11 IU/L (ref 0–32)
AST: 10 IU/L (ref 0–40)
Albumin/Globulin Ratio: 1.7 (ref 1.2–2.2)
Albumin: 4.2 g/dL (ref 3.8–4.8)
Alkaline Phosphatase: 59 IU/L (ref 44–121)
BUN/Creatinine Ratio: 23 (ref 12–28)
BUN: 32 mg/dL — ABNORMAL HIGH (ref 8–27)
Bilirubin Total: 0.2 mg/dL (ref 0.0–1.2)
CO2: 19 mmol/L — ABNORMAL LOW (ref 20–29)
Calcium: 9.2 mg/dL (ref 8.7–10.3)
Chloride: 107 mmol/L — ABNORMAL HIGH (ref 96–106)
Creatinine, Ser: 1.4 mg/dL — ABNORMAL HIGH (ref 0.57–1.00)
Globulin, Total: 2.5 g/dL (ref 1.5–4.5)
Glucose: 98 mg/dL (ref 70–99)
Potassium: 4.6 mmol/L (ref 3.5–5.2)
Sodium: 143 mmol/L (ref 134–144)
Total Protein: 6.7 g/dL (ref 6.0–8.5)
eGFR: 38 mL/min/{1.73_m2} — ABNORMAL LOW (ref >59)

## 2022-10-11 LAB — VITAMIN D 25 HYDROXY (VIT D DEFICIENCY, FRACTURES): Vit D, 25-Hydroxy: 32.9 ng/mL (ref 30.0–100.0)

## 2022-10-11 LAB — TSH: TSH: 1.02 u[IU]/mL (ref 0.450–4.500)

## 2022-10-12 ENCOUNTER — Telehealth: Payer: Self-pay | Admitting: Family

## 2022-10-12 NOTE — Telephone Encounter (Signed)
Patient informed that labs have not been reviewed yet because provider is off today but when they are reviewed we will give her a call with her results.

## 2022-12-10 ENCOUNTER — Other Ambulatory Visit: Payer: Self-pay | Admitting: Family

## 2022-12-10 DIAGNOSIS — J209 Acute bronchitis, unspecified: Secondary | ICD-10-CM

## 2022-12-10 DIAGNOSIS — E785 Hyperlipidemia, unspecified: Secondary | ICD-10-CM

## 2022-12-10 DIAGNOSIS — E781 Pure hyperglyceridemia: Secondary | ICD-10-CM

## 2022-12-26 ENCOUNTER — Ambulatory Visit (INDEPENDENT_AMBULATORY_CARE_PROVIDER_SITE_OTHER): Payer: Medicare Other

## 2022-12-26 VITALS — Ht 66.0 in | Wt 149.0 lb

## 2022-12-26 DIAGNOSIS — Z0000 Encounter for general adult medical examination without abnormal findings: Secondary | ICD-10-CM | POA: Diagnosis not present

## 2022-12-26 NOTE — Patient Instructions (Signed)
Ms. Erin Khan , Thank you for taking time to come for your Medicare Wellness Visit. I appreciate your ongoing commitment to your health goals. Please review the following plan we discussed and let me know if I can assist you in the future.   These are the goals we discussed:  Goals      DIET - INCREASE WATER INTAKE     Exercise 150 min/wk Moderate Activity     Increase physical activity     Walks briskly for almost a mile three times weekly.  She also works in [employer] , does house work and tends to grand children to increase physical activity level.  She was encouraged to try to walk more often.     Prevent falls        This is a list of the screening recommended for you and due dates:  Health Maintenance  Topic Date Due   Zoster (Shingles) Vaccine (2 of 2) 06/17/2022   COVID-19 Vaccine (5 - 2023-24 season) 09/13/2022   Flu Shot  03/30/2023   DEXA scan (bone density measurement)  06/17/2023   Medicare Annual Wellness Visit  12/26/2023   DTaP/Tdap/Td vaccine (2 - Td or Tdap) 10/10/2032   Pneumonia Vaccine  Completed   HPV Vaccine  Aged Out    Advanced directives: Advance directive discussed with you today. I have provided a copy for you to complete at home and have notarized. Once this is complete please bring a copy in to our office so we can scan it into your chart.   Conditions/risks identified: Aim for 30 minutes of exercise or brisk walking, 6-8 glasses of water, and 5 servings of fruits and vegetables each day.   Next appointment: Follow up in one year for your annual wellness visit    Preventive Care 65 Years and Older, Female Preventive care refers to lifestyle choices and visits with your health care provider that can promote health and wellness. What does preventive care include? A yearly physical exam. This is also called an annual well check. Dental exams once or twice a year. Routine eye exams. Ask your health care provider how often you should have your eyes  checked. Personal lifestyle choices, including: Daily care of your teeth and gums. Regular physical activity. Eating a healthy diet. Avoiding tobacco and drug use. Limiting alcohol use. Practicing safe sex. Taking low-dose aspirin every day. Taking vitamin and mineral supplements as recommended by your health care provider. What happens during an annual well check? The services and screenings done by your health care provider during your annual well check will depend on your age, overall health, lifestyle risk factors, and family history of disease. Counseling  Your health care provider may ask you questions about your: Alcohol use. Tobacco use. Drug use. Emotional well-being. Home and relationship well-being. Sexual activity. Eating habits. History of falls. Memory and ability to understand (cognition). Work and work Astronomer. Reproductive health. Screening  You may have the following tests or measurements: Height, weight, and BMI. Blood pressure. Lipid and cholesterol levels. These may be checked every 5 years, or more frequently if you are over 56 years old. Skin check. Lung cancer screening. You may have this screening every year starting at age 57 if you have a 30-pack-year history of smoking and currently smoke or have quit within the past 15 years. Fecal occult blood test (FOBT) of the stool. You may have this test every year starting at age 58. Flexible sigmoidoscopy or colonoscopy. You may have a  sigmoidoscopy every 5 years or a colonoscopy every 10 years starting at age 56. Hepatitis C blood test. Hepatitis B blood test. Sexually transmitted disease (STD) testing. Diabetes screening. This is done by checking your blood sugar (glucose) after you have not eaten for a while (fasting). You may have this done every 1-3 years. Bone density scan. This is done to screen for osteoporosis. You may have this done starting at age 21. Mammogram. This may be done every 1-2  years. Talk to your health care provider about how often you should have regular mammograms. Talk with your health care provider about your test results, treatment options, and if necessary, the need for more tests. Vaccines  Your health care provider may recommend certain vaccines, such as: Influenza vaccine. This is recommended every year. Tetanus, diphtheria, and acellular pertussis (Tdap, Td) vaccine. You may need a Td booster every 10 years. Zoster vaccine. You may need this after age 35. Pneumococcal 13-valent conjugate (PCV13) vaccine. One dose is recommended after age 30. Pneumococcal polysaccharide (PPSV23) vaccine. One dose is recommended after age 27. Talk to your health care provider about which screenings and vaccines you need and how often you need them. This information is not intended to replace advice given to you by your health care provider. Make sure you discuss any questions you have with your health care provider. Document Released: 09/11/2015 Document Revised: 05/04/2016 Document Reviewed: 06/16/2015 Elsevier Interactive Patient Education  2017 [place] Prevention in the Home Falls can cause injuries. They can happen to people of all ages. There are many things you can do to make your home safe and to help prevent falls. What can I do on the outside of my home? Regularly fix the edges of walkways and driveways and fix any cracks. Remove anything that might make you trip as you walk through a door, such as a raised step or threshold. Trim any bushes or trees on the path to your home. Use bright outdoor lighting. Clear any walking paths of anything that might make someone trip, such as rocks or tools. Regularly check to see if handrails are loose or broken. Make sure that both sides of any steps have handrails. Any raised decks and porches should have guardrails on the edges. Have any leaves, snow, or ice cleared regularly. Use sand or salt on walking paths  during winter. Clean up any spills in your garage right away. This includes oil or grease spills. What can I do in the bathroom? Use night lights. Install grab bars by the toilet and in the tub and shower. Do not use towel bars as grab bars. Use non-skid mats or decals in the tub or shower. If you need to sit down in the shower, use a plastic, non-slip stool. Keep the floor dry. Clean up any water that spills on the floor as soon as it happens. Remove soap buildup in the tub or shower regularly. Attach bath mats securely with double-sided non-slip rug tape. Do not have throw rugs and other things on the floor that can make you trip. What can I do in the bedroom? Use night lights. Make sure that you have a light by your bed that is easy to reach. Do not use any sheets or blankets that are too big for your bed. They should not hang down onto the floor. Have a firm chair that has side arms. You can use this for support while you get dressed. Do not have throw rugs and other  things on the floor that can make you trip. What can I do in the kitchen? Clean up any spills right away. Avoid walking on wet floors. Keep items that you use a lot in easy-to-reach places. If you need to reach something above you, use a strong step stool that has a grab bar. Keep electrical cords out of the way. Do not use floor polish or wax that makes floors slippery. If you must use wax, use non-skid floor wax. Do not have throw rugs and other things on the floor that can make you trip. What can I do with my stairs? Do not leave any items on the stairs. Make sure that there are handrails on both sides of the stairs and use them. Fix handrails that are broken or loose. Make sure that handrails are as long as the stairways. Check any carpeting to make sure that it is firmly attached to the stairs. Fix any carpet that is loose or worn. Avoid having throw rugs at the top or bottom of the stairs. If you do have throw  rugs, attach them to the floor with carpet tape. Make sure that you have a light switch at the top of the stairs and the bottom of the stairs. If you do not have them, ask someone to add them for you. What else can I do to help prevent falls? Wear shoes that: Do not have high heels. Have rubber bottoms. Are comfortable and fit you well. Are closed at the toe. Do not wear sandals. If you use a stepladder: Make sure that it is fully opened. Do not climb a closed stepladder. Make sure that both sides of the stepladder are locked into place. Ask someone to hold it for you, if possible. Clearly mark and make sure that you can see: Any grab bars or handrails. First and last steps. Where the edge of each step is. Use tools that help you move around (mobility aids) if they are needed. These include: Canes. Walkers. Scooters. Crutches. Turn on the lights when you go into a dark area. Replace any light bulbs as soon as they burn out. Set up your furniture so you have a clear path. Avoid moving your furniture around. If any of your floors are uneven, fix them. If there are any pets around you, be aware of where they are. Review your medicines with your doctor. Some medicines can make you feel dizzy. This can increase your chance of falling. Ask your doctor what other things that you can do to help prevent falls. This information is not intended to replace advice given to you by your health care provider. Make sure you discuss any questions you have with your health care provider. Document Released: 06/11/2009 Document Revised: 01/21/2016 Document Reviewed: 09/19/2014 Elsevier Interactive Patient Education  2017 [employer].

## 2022-12-26 NOTE — Progress Notes (Signed)
Subjective:   Erin Khan is a 82 y.o. female who presents for Medicare Annual (Subsequent) preventive examination. I connected with  Erin Khan on 12/26/22 by a audio enabled telemedicine application and verified that I am speaking with the correct person using two identifiers.  Patient Location: Home  Provider Location: Home Office  I discussed the limitations of evaluation and management by telemedicine. The patient expressed understanding and agreed to proceed.  Review of Systems     Cardiac Risk Factors include: advanced age (>26men, >61 women);dyslipidemia;hypertension     Objective:    Today's Vitals   12/26/22 1034  Weight: 149 lb (67.6 kg)  Height: 5\' 6"  (1.676 m)   Body mass index is 24.05 kg/m.     12/26/2022   10:37 AM 12/20/2021   10:10 AM 06/22/2021    8:06 AM 12/15/2020    8:47 AM 06/22/2020    4:34 PM 05/21/2019    1:47 PM 10/18/2018    9:26 AM  Advanced Directives  Does Patient Have a Medical Advance Directive? No Yes Yes Yes No No No  Type of Advance Directive  Healthcare Power of [employer] Power of Attorney     Does patient want to make changes to medical advance directive?    No - Patient declined     Copy of Healthcare Power of Attorney in Chart?  No - copy requested       Would patient like information on creating a medical advance directive? No - Patient declined    No - Patient declined No - Patient declined No - Patient declined    Current Medications (verified) Outpatient Encounter Medications as of 12/26/2022  Medication Sig   alendronate (FOSAMAX) 70 MG tablet Take 1 tablet (70 mg total) by mouth every 7 (seven) days. Take with a full glass of water on an empty stomach.   amLODipine (NORVASC) 5 MG tablet Take 1 tablet (5 mg total) by mouth daily.   Calcium Carbonate-Vitamin D (CALCIUM 600+D PO) Take 1 tablet by mouth daily.   cetirizine (ZYRTEC) 10 MG tablet Take 1 tablet by mouth once daily   Ergocalciferol (VITAMIN D2) 10 MCG  (400 UNIT) TABS Take 400 Units by mouth daily.   ezetimibe (ZETIA) 10 MG tablet Take 1 tablet (10 mg total) by mouth daily.   lisinopril (ZESTRIL) 40 MG tablet Take 1 tablet (40 mg total) by mouth daily.   magnesium (MAGTAB) 84 MG ( ) TBCR SR tablet Take 84 mg by mouth.   rosuvastatin (CRESTOR) 10 MG tablet Take 1 tablet by mouth once daily   vitamin B-12 (CYANOCOBALAMIN) 500 MCG tablet Take 500 mcg by mouth daily.    No facility-administered encounter medications on file as of 12/26/2022.    Allergies (verified) Asa [aspirin] and Statins   History: Past Medical History:  Diagnosis Date   Cataract    Colon polyps    Hypercholesteremia    Hypertension    Past Surgical History:  Procedure Laterality Date   ABDOMINAL HYSTERECTOMY     BILATERAL OOPHORECTOMY     CATARACT EXTRACTION, BILATERAL  2014   CHOLECYSTECTOMY  1980   COLONOSCOPY     COLONOSCOPY N/A 05/15/2013   Procedure: COLONOSCOPY;  Surgeon: Malissa Hippo, MD;  [facility]: [facility];  Service: Endoscopy;  Laterality: N/A;  930   COLONOSCOPY N/A 10/18/2018   Procedure: COLONOSCOPY;  Surgeon: Malissa Hippo, MD;  Location: [facility];  Service: Endoscopy;  Laterality: N/A;  830  cranial shunt     17 years ago   POLYPECTOMY  10/18/2018   Procedure: POLYPECTOMY;  Surgeon: Malissa Hippo, MD;  Location: [facility];  Service: Endoscopy;;  colon   Family History  Problem Relation Age of Onset   Hypertension Father    Lung cancer Sister    Arthritis Sister    Heart attack Brother 42   Heart disease Brother        No details   Asthma Brother    Healthy Daughter    Healthy Son    Colon cancer Neg Hx    Social History   Socioeconomic History   Marital status: Married    Spouse name: Erin Khan   Number of children: 2   Years of education: 12   Highest education level: 12th grade  Occupational History   Occupation: Retired    Comment: Retail banker   Occupation: Garden Comptroller: [employer]     Comment: 20 to 25 hours a week part time  Tobacco Use   Smoking status: Never    Passive exposure: Never   Smokeless tobacco: Never  Vaping Use   Vaping Use: Never used  Substance and Sexual Activity   Alcohol use: No   Drug use: No   Sexual activity: Yes  Other Topics Concern   Not on file  Social History Narrative   She lives with her husband, Erin Khan.   She  has eight grandchildren.   Social Determinants of Health   Financial Resource Strain: Low Risk  (12/26/2022)   Overall Financial Resource Strain (CARDIA)    Difficulty of Paying Living Expenses: Not hard at all  Food Insecurity: No Food Insecurity (12/26/2022)   Hunger Vital Sign    Worried About Running Out of Food in the Last Year: Never true    Ran Out of Food in the Last Year: Never true  Transportation Needs: No Transportation Needs (12/26/2022)   PRAPARE - Administrator, Civil Service (Medical): No    Lack of Transportation (Non-Medical): No  Physical Activity: Insufficiently Active (12/26/2022)   Exercise Vital Sign    Days of Exercise per Week: 3 days    Minutes of Exercise per Session: 30 min  Stress: No Stress Concern Present (12/26/2022)   [employer] of Occupational Health - Occupational Stress Questionnaire    Feeling of Stress : Not at all  Social Connections: Moderately Integrated (12/26/2022)   Social Connection and Isolation Panel [NHANES]    Frequency of Communication with Friends and Family: More than three times a week    Frequency of Social Gatherings with Friends and Family: More than three times a week    Attends Religious Services: More than 4 times per year    Active Member of [employer] or Organizations: No    Attends Engineer, structural: Never    Marital Status: Married    Tobacco Counseling Counseling given: Not Answered   Clinical Intake:  Pre-visit preparation completed: Yes  Pain : No/denies pain     Nutritional Risks: None Diabetes: No  How often do  you need to have someone help you when you read instructions, pamphlets, or other written materials from your doctor or pharmacy?: 1 - Never  Diabetic?no   Interpreter Needed?: No  Information entered by :: Renie Ora, LPN   Activities of Daily Living    12/26/2022   10:37 AM  In your present state of health, do you have any difficulty performing the  following activities:  Hearing? 0  Vision? 0  Difficulty concentrating or making decisions? 0  Walking or climbing stairs? 0  Dressing or bathing? 0  Doing errands, shopping? 0  Preparing Food and eating ? N  Using the Toilet? N  In the past six months, have you accidently leaked urine? N  Do you have problems with loss of bowel control? N  Managing your Medications? N  Managing your Finances? N  Housekeeping or managing your Housekeeping? N    Patient Care Team: Junie Spencer, FNP as PCP - General (Family Medicine) Huel Cote, MD as Consulting Physician (Obstetrics and Gynecology) Malissa Hippo, MD (Inactive) as Consulting Physician (Gastroenterology) Danella Maiers, [facility] as [facility] Care Management (Pharmacist)  Indicate any recent Medical Services you may have received from other than [facility] providers in the past year (date may be approximate).     Assessment:   This is a routine wellness examination for Marisela.  Hearing/Vision screen Vision Screening - Comments:: Wears rx glasses - up to date with routine eye exams with  Dr.Johnson   Dietary issues and exercise activities discussed: Current Exercise Habits: Home exercise routine, Type of exercise: walking, Frequency (Times/Week): 3, Intensity: Mild, Exercise limited by: None identified   Goals Addressed             This Visit's Progress    DIET - INCREASE WATER INTAKE   On track      Depression Screen    12/26/2022   10:36 AM 10/10/2022    9:14 AM 02/21/2022    8:46 AM 12/20/2021   10:08 AM 06/15/2021    9:05 AM 02/09/2021   10:52  AM 01/22/2021    8:40 AM  PHQ 2/9 Scores  PHQ - 2 Score 0 0 0 0 0 0 0  PHQ- 9 Score  0   1  0    Fall Risk    12/26/2022   10:35 AM 10/10/2022    9:14 AM 02/21/2022    8:46 AM 12/20/2021   10:11 AM 07/12/2021    8:32 AM  Fall Risk   Falls in the past year? 0 0 0 0 1  Number falls in past yr: 0 0 0 0 0  Injury with Fall? 0 0 0 0 1  Risk for fall due to : No Fall Risks No Fall Risks  History of fall(s);Impaired balance/gait Impaired balance/gait  Follow up Falls prevention discussed Falls evaluation completed Falls evaluation completed Falls prevention discussed Education provided    FALL RISK PREVENTION PERTAINING TO THE HOME:  Any stairs in or around the home? No  If so, are there any without handrails? No  Home free of loose throw rugs in walkways, pet beds, electrical cords, etc? Yes  Adequate lighting in your home to reduce risk of falls? Yes   ASSISTIVE DEVICES UTILIZED TO PREVENT FALLS:  Life alert? No  Use of a cane, walker or w/c? No  Grab bars in the bathroom? No  Shower chair or bench in shower? Yes  Elevated toilet seat or a handicapped toilet? No       01/15/2018    3:14 PM 12/08/2016    2:31 PM  MMSE - Mini Mental State Exam  Orientation to time 5 5  Orientation to Place 5 5  Registration 3 3  Attention/ Calculation 5 5  Recall 2 3  Language- name 2 objects 2 2  Language- repeat 1 1  Language- follow 3 step command 3  3  Language- read & follow direction 1 1  Write a sentence 1 1  Copy design 1 1  Total score 29 30        12/26/2022   10:37 AM 12/20/2021   10:14 AM 12/15/2020    8:51 AM 05/21/2019    1:41 PM  6CIT Screen  What Year? 0 points 0 points 0 points 0 points  What month? 0 points 0 points 0 points 0 points  What time? 0 points 0 points 0 points 0 points  Count back from 20 0 points 0 points 0 points 0 points  Months in reverse 0 points 0 points 0 points 0 points  Repeat phrase 0 points 0 points 2 points 0 points  Total Score 0 points 0  points 2 points 0 points    Immunizations Immunization History  Administered Date(s) Administered   Fluad Quad(high Dose 65+) 06/03/2019, 05/19/2020, 05/30/2022   Influenza Inj Mdck Quad Pf 06/03/2019   Influenza Split 06/08/2021   Influenza, High Dose Seasonal PF 05/30/2017, 06/12/2018   Influenza,inj,Quad PF,6+ Mos 05/28/2016   Influenza-Unspecified 06/03/2019   Moderna Sars-Covid-2 Vaccination 10/14/2019, 11/11/2019, 07/07/2020, 07/19/2022   Pneumococcal Conjugate-13 05/29/2014, 07/25/2016   Pneumococcal Polysaccharide-23 03/22/2016   RSV,unspecified 05/30/2022   Tdap 10/10/2022   Zoster Recombinat (Shingrix) 04/22/2022    TDAP status: Up to date  Flu Vaccine status: Up to date  Pneumococcal vaccine status: Up to date  Covid-19 vaccine status: Completed vaccines  Qualifies for Shingles Vaccine? Yes   Zostavax completed No   Shingrix Completed?: Yes  Screening Tests Health Maintenance  Topic Date Due   Zoster Vaccines- Shingrix (2 of 2) 06/17/2022   COVID-19 Vaccine (5 - 2023-24 season) 09/13/2022   INFLUENZA VACCINE  03/30/2023   DEXA SCAN  06/17/2023   Medicare Annual Wellness (AWV)  12/26/2023   DTaP/Tdap/Td (2 - Td or Tdap) 10/10/2032   Pneumonia Vaccine 60+ Years old  Completed   HPV VACCINES  Aged Out    Health Maintenance  Health Maintenance Due  Topic Date Due   Zoster Vaccines- Shingrix (2 of 2) 06/17/2022   COVID-19 Vaccine (5 - 2023-24 season) 09/13/2022    Colorectal cancer screening: No longer required.   Mammogram status: No longer required due to age .  Bone Density status: Completed 06/16/2021. Results reflect: Bone density results: OSTEOPOROSIS. Repeat every 2 years.  Lung Cancer Screening: (Low Dose CT Chest recommended if Age 75-80 years, 30 pack-year currently smoking OR have quit w/in 15years.) does not qualify.   Lung Cancer Screening Referral: n/a  Additional Screening:  Hepatitis C Screening: does not qualify;   Vision  Screening: Recommended annual ophthalmology exams for early detection of glaucoma and other disorders of the eye. Is the patient up to date with their annual eye exam?  Yes  Who is the provider or what is the name of the office in which the patient attends annual eye exams? Dr.Johnson  If pt is not established with a provider, would they like to be referred to a provider to establish care? No .   Dental Screening: Recommended annual dental exams for proper oral hygiene  Community Resource Referral / Chronic Care Management: CRR required this visit?  No   CCM required this visit?  No      Plan:     I have personally reviewed and noted the following in the patient's chart:   Medical and social history Use of alcohol, tobacco or illicit drugs  Current medications and supplements  including opioid prescriptions. Patient is not currently taking opioid prescriptions. Functional ability and status Nutritional status Physical activity Advanced directives List of other physicians Hospitalizations, surgeries, and ER visits in previous 12 months Vitals Screenings to include cognitive, depression, and falls Referrals and appointments  In addition, I have reviewed and discussed with patient certain preventive protocols, quality metrics, and best practice recommendations. A written personalized care plan for preventive services as well as general preventive health recommendations were provided to patient.     Lorrene Reid, LPN   1/61/0960   Nurse Notes: Due 2nd dose Shingrix

## 2023-01-15 ENCOUNTER — Other Ambulatory Visit: Payer: Self-pay | Admitting: Family

## 2023-01-15 DIAGNOSIS — I10 Essential (primary) hypertension: Secondary | ICD-10-CM

## 2023-01-18 DIAGNOSIS — Z960 Presence of urogenital implants: Secondary | ICD-10-CM | POA: Diagnosis not present

## 2023-01-26 ENCOUNTER — Other Ambulatory Visit: Payer: Self-pay | Admitting: *Deleted

## 2023-01-26 DIAGNOSIS — M810 Age-related osteoporosis without current pathological fracture: Secondary | ICD-10-CM

## 2023-01-26 MED ORDER — ALENDRONATE SODIUM 70 MG PO TABS
70.0000 mg | ORAL_TABLET | ORAL | 0 refills | Status: DC
Start: 2023-01-26 — End: 2023-05-24

## 2023-02-27 ENCOUNTER — Other Ambulatory Visit: Payer: Self-pay | Admitting: Family

## 2023-02-27 DIAGNOSIS — I10 Essential (primary) hypertension: Secondary | ICD-10-CM

## 2023-02-27 DIAGNOSIS — E785 Hyperlipidemia, unspecified: Secondary | ICD-10-CM

## 2023-02-27 DIAGNOSIS — E781 Pure hyperglyceridemia: Secondary | ICD-10-CM

## 2023-03-18 ENCOUNTER — Other Ambulatory Visit: Payer: Self-pay | Admitting: Family

## 2023-03-18 DIAGNOSIS — E781 Pure hyperglyceridemia: Secondary | ICD-10-CM

## 2023-03-18 DIAGNOSIS — E785 Hyperlipidemia, unspecified: Secondary | ICD-10-CM

## 2023-04-10 ENCOUNTER — Encounter: Payer: Self-pay | Admitting: Family

## 2023-04-10 ENCOUNTER — Ambulatory Visit (INDEPENDENT_AMBULATORY_CARE_PROVIDER_SITE_OTHER): Payer: Medicare Other | Admitting: Family

## 2023-04-10 VITALS — BP 122/73 | HR 71 | Temp 97.2°F | Ht 66.0 in | Wt 153.4 lb

## 2023-04-10 DIAGNOSIS — N1832 Chronic kidney disease, stage 3b: Secondary | ICD-10-CM

## 2023-04-10 DIAGNOSIS — L570 Actinic keratosis: Secondary | ICD-10-CM

## 2023-04-10 DIAGNOSIS — Z23 Encounter for immunization: Secondary | ICD-10-CM

## 2023-04-10 DIAGNOSIS — E781 Pure hyperglyceridemia: Secondary | ICD-10-CM

## 2023-04-10 DIAGNOSIS — E785 Hyperlipidemia, unspecified: Secondary | ICD-10-CM

## 2023-04-10 DIAGNOSIS — N819 Female genital prolapse, unspecified: Secondary | ICD-10-CM

## 2023-04-10 DIAGNOSIS — M810 Age-related osteoporosis without current pathological fracture: Secondary | ICD-10-CM | POA: Diagnosis not present

## 2023-04-10 DIAGNOSIS — I10 Essential (primary) hypertension: Secondary | ICD-10-CM

## 2023-04-10 DIAGNOSIS — Z960 Presence of urogenital implants: Secondary | ICD-10-CM | POA: Diagnosis not present

## 2023-04-10 LAB — CBC WITH DIFFERENTIAL/PLATELET
Basophils Absolute: 0.1 10*3/uL (ref 0.0–0.2)
Basos: 1 %
EOS (ABSOLUTE): 0.1 10*3/uL (ref 0.0–0.4)
Eos: 2 %
Hematocrit: 37.6 % (ref 34.0–46.6)
Hemoglobin: 12.6 g/dL (ref 11.1–15.9)
Immature Grans (Abs): 0 10*3/uL (ref 0.0–0.1)
Immature Granulocytes: 0 %
Lymphocytes Absolute: 1.9 10*3/uL (ref 0.7–3.1)
Lymphs: 27 %
MCH: 30.7 pg (ref 26.6–33.0)
MCHC: 33.5 g/dL (ref 31.5–35.7)
MCV: 92 fL (ref 79–97)
Monocytes Absolute: 0.8 10*3/uL (ref 0.1–0.9)
Monocytes: 11 %
Neutrophils Absolute: 4.3 10*3/uL (ref 1.4–7.0)
Neutrophils: 59 %
Platelets: 179 10*3/uL (ref 150–450)
RBC: 4.11 x10E6/uL (ref 3.77–5.28)
RDW: 12.5 % (ref 11.7–15.4)
WBC: 7.1 10*3/uL (ref 3.4–10.8)

## 2023-04-10 LAB — CMP14+EGFR
ALT: 13 IU/L (ref 0–32)
AST: 14 IU/L (ref 0–40)
Albumin: 4.2 g/dL (ref 3.8–4.8)
Alkaline Phosphatase: 55 IU/L (ref 44–121)
BUN/Creatinine Ratio: 24 (ref 12–28)
BUN: 32 mg/dL — ABNORMAL HIGH (ref 8–27)
Bilirubin Total: 0.4 mg/dL (ref 0.0–1.2)
CO2: 21 mmol/L (ref 20–29)
Calcium: 9.6 mg/dL (ref 8.7–10.3)
Chloride: 106 mmol/L (ref 96–106)
Creatinine, Ser: 1.32 mg/dL — ABNORMAL HIGH (ref 0.57–1.00)
Globulin, Total: 2.5 g/dL (ref 1.5–4.5)
Glucose: 90 mg/dL (ref 70–99)
Potassium: 4.7 mmol/L (ref 3.5–5.2)
Sodium: 141 mmol/L (ref 134–144)
Total Protein: 6.7 g/dL (ref 6.0–8.5)
eGFR: 41 mL/min/{1.73_m2} — ABNORMAL LOW (ref >59)

## 2023-04-10 MED ORDER — LISINOPRIL 40 MG PO TABS
40.0000 mg | ORAL_TABLET | Freq: Every day | ORAL | 2 refills | Status: DC
Start: 2023-04-10 — End: 2024-03-12

## 2023-04-10 NOTE — Patient Instructions (Signed)
Actinic Keratosis An actinic keratosis is a precancerous growth on the skin. If there is more than one, the condition is called actinic keratoses. These growths appear most often on parts of the skin that get a lot of sun exposure, including the: Scalp. Face. Ears. Lips. Upper back. Forearms. Backs of the hands. If left untreated, these growths may develop into a skin cancer called squamous cell carcinoma. It is important to have all these growths checked by a health care provider to determine the best treatment. What are the causes? Actinic keratoses are caused by getting too much ultraviolet (UV) radiation from the sun or other UV light sources. What increases the risk? You are more likely to develop this condition if you: Have light-colored skin or blue eyes. Have blond or red hair. Spend a lot of time in the sun. Do not protect your skin from the sun when outdoors. Are an older person. The risk of developing an actinic keratosis increases with age. What are the signs or symptoms? These growths feel like scaly, rough spots of skin. Symptoms of this condition include growths that may: Be as small as a pinhead or as big as a quarter. Itch, hurt, or feel sensitive. Be skin-colored, light tan, dark tan, pink, or a combination of these colors. In most cases, the growths become red. Have a small piece of pink or gray skin (skin tag) growing from them. It may be easier to notice the growths by feeling them rather than seeing them. Sometimes, actinic keratoses disappear but may return a few days to a few weeks later. How is this diagnosed? This condition is usually diagnosed with a physical exam. A tissue sample may be removed from the growth and examined under a microscope (biopsy). How is this treated? This condition may be treated by: Scraping off the actinic keratosis (curettage). Freezing the actinic keratosis with liquid nitrogen (cryosurgery). This causes the growth to eventually  fall off. Applying medicated creams or gels to destroy the cells in the growth. Applying chemicals to the growth to make the outer layers of skin peel off (chemical peel). Using photodynamic therapy. In this procedure, medicated cream is applied to the actinic keratosis. This cream increases your skin's sensitivity to light. Then, a strong light is aimed at the actinic keratosis to destroy cells in the growth. Follow these instructions at home: Skin care Apply cool, wet cloths (coolcompresses) to the affected areas. Do not scratch your skin. Check your skin regularly for any growths, especially ones that: Start to itch or bleed. Change in size, shape, or color. Caring for the treated area Keep the treated area clean and dry as told by your health care provider. Do not apply any medicine, cream, or lotion to the treated area unless your health care provider tells you to do that. Do not pick at blisters or try to break them open. This can cause infection and scarring. If you have red or irritated skin after treatment, follow instructions from your health care provider about how to take care of the treated area. Make sure you: Wash your hands with soap and water for at least 20 seconds before and after you change your bandage (dressing). If soap and water are not available, use hand sanitizer. Change your dressing as told by your health care provider. If you have red or irritated skin after treatment, check the treated area every day for signs of infection. Check for: Redness, swelling, or pain. Fluid or blood. Warmth. Pus or a bad   smell. Lifestyle Do not use any products that contain nicotine or tobacco. These products include cigarettes, chewing tobacco, and vaping devices, such as e-cigarettes. If you need help quitting, ask your health care provider. Take steps to protect your skin from the sun, such as: Avoiding the sun between 10:00 a.m. and 4:00 p.m. This is when the UV light is the  strongest. Using a sunscreen or sunblock with SPF 30 or greater. Applying sunscreen before you are exposed to sunlight and reapplying as often as told by the instructions on the sunscreen container. Wearing protective gear, including: Sunglasses with UV protection. A hat and clothing that protect your skin from sunlight. Avoiding medicines that increase your sensitivity to sunlight when possible. Avoidingtanning beds and other indoor tanning devices. General instructions Take or apply over-the-counter and prescription medicines only as told by your health care provider. Return to your normal activities as told by your health care provider. Ask your health care provider what activities are safe for you. Have a skin exam done every year by a health care provider who is a skin specialist (dermatologist). Keep all follow-up visits. Your health care provider will want to check that the site has healed after treatment. Contact a health care provider if: You notice any changes or new growths on your skin. You have swelling, pain, or redness around your treated area. You have fluid or blood coming from your treated area. Your treated area feels warm to the touch. You have pus or a bad smell coming from your treated area. You have a fever or chills. You have a blister that becomes large and painful. Summary An actinic keratosis is a precancerous growth on the skin.If left untreated, these growths can develop into skin cancer. Check your skin regularly for any growths, especially growths that start to itch or bleed, or change in size, shape, or color. Take steps to protect your skin from the sun. Contact a health care provider if you notice any changes or new growths on your skin. This information is not intended to replace advice given to you by your health care provider. Make sure you discuss any questions you have with your health care provider. Document Revised: 10/28/2021 Document Reviewed:  10/28/2021 Elsevier Patient Education  2024 Elsevier Inc.  

## 2023-04-10 NOTE — Progress Notes (Signed)
Subjective:    Patient ID: Erin Khan, female    DOB: February 02, 1941, 82 y.o.   MRN: [id number]  Chief Complaint  Patient presents with   Medical Management of Chronic Issues   PT presents to the office today for chronic follow up. She is followed by GYN every 6 months for pessary maintenance.    She has CKD and avoids NSAID's.   She has osteoporosis and takes Fosamax weekly. Her last Dexa scan was 06/15/21.  She is complaining of skin lesion on left forearm that she noticed 6 weeks ago and has become larger.  Hypertension This is a chronic problem. The current episode started more than 1 year ago. The problem has been waxing and waning since onset. The problem is uncontrolled. Associated symptoms include malaise/fatigue. Pertinent negatives include no peripheral edema or shortness of breath. Risk factors for coronary artery disease include dyslipidemia and sedentary lifestyle. The current treatment provides moderate improvement.  Hyperlipidemia This is a chronic problem. The current episode started more than 1 year ago. The problem is controlled. Recent lipid tests were reviewed and are normal. Exacerbating diseases include obesity. Pertinent negatives include no shortness of breath. Current antihyperlipidemic treatment includes statins and ezetimibe. The current treatment provides moderate improvement of lipids. Risk factors for coronary artery disease include dyslipidemia, hypertension, a sedentary lifestyle and post-menopausal.      Review of Systems  Constitutional:  Positive for malaise/fatigue.  Respiratory:  Negative for shortness of breath.   All other systems reviewed and are negative.      Objective:   Physical Exam Vitals reviewed.  Constitutional:      General: She is not in acute distress.    Appearance: She is well-developed.  HENT:     Head: Normocephalic and atraumatic.     Right Ear: Tympanic membrane normal.     Left Ear: Tympanic membrane normal.  Eyes:      Pupils: Pupils are equal, round, and reactive to light.  Neck:     Thyroid: No thyromegaly.  Cardiovascular:     Rate and Rhythm: Normal rate and regular rhythm.     Heart sounds: Normal heart sounds. No murmur heard. Pulmonary:     Effort: Pulmonary effort is normal. No respiratory distress.     Breath sounds: Normal breath sounds. No wheezing.  Abdominal:     General: Bowel sounds are normal. There is no distension.     Palpations: Abdomen is soft.     Tenderness: There is no abdominal tenderness.  Musculoskeletal:        General: No tenderness. Normal range of motion.     Cervical back: Normal range of motion and neck supple.  Skin:    General: Skin is warm and dry.     Findings: Lesion present.     Comments: Skin lesion on left forearm 1.5X1  Neurological:     Mental Status: She is alert and oriented to person, place, and time.     Cranial Nerves: No cranial nerve deficit.     Deep Tendon Reflexes: Reflexes are normal and symmetric.  Psychiatric:        Behavior: Behavior normal.        Thought Content: Thought content normal.        Judgment: Judgment normal.      Cryotherapy used on actinic keratosis on left forearm. PT tolerated well.   BP 122/73   Pulse 71   Temp (!) 97.2 F (36.2 C) (Temporal)   Ht 5'  6" (1.676 m)   Wt 153 lb 6.4 oz (69.6 kg)   SpO2 (!) 71%   BMI 24.76 kg/m      Assessment & Plan:   Erin Khan comes in today with chief complaint of Medical Management of Chronic Issues   Diagnosis and orders addressed:  1. Primary hypertension - lisinopril (ZESTRIL) 40 MG tablet; Take 1 tablet (40 mg total) by mouth daily.  Dispense: 90 tablet; Refill: 2 - CBC with Differential/Platelet - CMP14+EGFR  2. Presence of pessary - CBC with Differential/Platelet - CMP14+EGFR  3. Vaginal vault prolapse - CBC with Differential/Platelet - CMP14+EGFR  4. Age-related osteoporosis without current pathological fracture - CBC with Differential/Platelet -  CMP14+EGFR  5. Hypertriglyceridemia - CBC with Differential/Platelet - CMP14+EGFR  6. Hyperlipidemia, unspecified hyperlipidemia type - CBC with Differential/Platelet - CMP14+EGFR  7. Stage 3b chronic kidney disease (HCC) - CBC with Differential/Platelet - CMP14+EGFR  8. Actinic keratosis If area blisters, avoid picking  - CBC with Differential/Platelet - CMP14+EGFR   Labs pending Health Maintenance reviewed Diet and exercise encouraged  Follow up plan: 6 months    Erin Rodney, FNP

## 2023-04-27 DIAGNOSIS — Z960 Presence of urogenital implants: Secondary | ICD-10-CM | POA: Diagnosis not present

## 2023-05-12 ENCOUNTER — Other Ambulatory Visit (HOSPITAL_COMMUNITY): Payer: Self-pay | Admitting: Obstetrics and Gynecology

## 2023-05-12 DIAGNOSIS — Z1231 Encounter for screening mammogram for malignant neoplasm of breast: Secondary | ICD-10-CM

## 2023-05-24 ENCOUNTER — Telehealth: Payer: Self-pay | Admitting: Family

## 2023-05-24 DIAGNOSIS — M810 Age-related osteoporosis without current pathological fracture: Secondary | ICD-10-CM

## 2023-05-24 MED ORDER — ALENDRONATE SODIUM 70 MG PO TABS: 70.0000 mg | ORAL_TABLET | ORAL | 1 refills | Status: DC

## 2023-05-24 NOTE — Telephone Encounter (Signed)
  Prescription Request  05/24/2023  Is this a "Controlled Substance" medicine? no  Have you seen your PCP in the last 2 weeks? No pt next appt is on 10/12/2023  If YES, route message to pool  -  If NO, patient needs to be scheduled for appointment.  What is the name of the medication or equipment? alendronate (FOSAMAX) 70 MG tablet [   Have you contacted your pharmacy to request a refill? yes   Which pharmacy would you like this sent to? [facility] [place]    Patient notified that their request is being sent to the clinical staff for review and that they should receive a response within 2 business days.

## 2023-05-24 NOTE — Telephone Encounter (Signed)
28m supply with one refill sent to [facility] in [place]. Pt made aware.

## 2023-05-29 ENCOUNTER — Other Ambulatory Visit: Payer: Self-pay | Admitting: Family

## 2023-05-29 DIAGNOSIS — J209 Acute bronchitis, unspecified: Secondary | ICD-10-CM

## 2023-06-23 ENCOUNTER — Encounter (HOSPITAL_COMMUNITY): Payer: Self-pay

## 2023-06-23 ENCOUNTER — Ambulatory Visit (HOSPITAL_COMMUNITY)
Admission: RE | Admit: 2023-06-23 | Discharge: 2023-06-23 | Disposition: A | Discharge status: Home / Self Care | Payer: Medicare Other | Source: Ambulatory Visit | Attending: Obstetrics and Gynecology | Admitting: Obstetrics and Gynecology

## 2023-06-23 DIAGNOSIS — Z1231 Encounter for screening mammogram for malignant neoplasm of breast: Secondary | ICD-10-CM | POA: Insufficient documentation

## 2023-07-19 DIAGNOSIS — Z4689 Encounter for fitting and adjustment of other specified devices: Secondary | ICD-10-CM | POA: Diagnosis not present

## 2023-09-01 ENCOUNTER — Encounter (INDEPENDENT_AMBULATORY_CARE_PROVIDER_SITE_OTHER): Payer: Self-pay | Admitting: *Deleted

## 2023-09-17 ENCOUNTER — Other Ambulatory Visit: Payer: Self-pay | Admitting: Family

## 2023-09-17 DIAGNOSIS — E781 Pure hyperglyceridemia: Secondary | ICD-10-CM

## 2023-09-17 DIAGNOSIS — E785 Hyperlipidemia, unspecified: Secondary | ICD-10-CM

## 2023-09-25 ENCOUNTER — Other Ambulatory Visit: Payer: Self-pay | Admitting: Cardiology

## 2023-09-29 ENCOUNTER — Telehealth (INDEPENDENT_AMBULATORY_CARE_PROVIDER_SITE_OTHER): Payer: Self-pay | Admitting: Gastroenterology

## 2023-09-29 NOTE — Telephone Encounter (Signed)
Who is your primary care physician: Jannifer Rodney [facility])  Reasons for the colonoscopy:   Have you had a colonoscopy before?  Yes unknown  Do you have family history of colon cancer? no  Previous colonoscopy with polyps removed? no  Do you have a history colorectal cancer?   no  Are you diabetic? If yes, Type 1 or Type 2?    no  Do you have a prosthetic or mechanical heart valve? no  Do you have a pacemaker/defibrillator?   no  Have you had endocarditis/atrial fibrillation? no  Have you had joint replacement within the last 12 months?  no  Do you tend to be constipated or have to use laxatives? no  Do you have any history of drugs or alchohol?  no  Do you use supplemental oxygen?  no  Have you had a stroke or heart attack within the last 6 months? nono  Do you take weight loss medication?  yes  For female patients: have you had a hysterectomy?  yes                                    are you post menopausal?                                                do you still have your menstrual cycle? no      Do you take any blood-thinning medications such as: (aspirin, warfarin, Plavix, Aggrenox)    If yes we need the name, milligram, dosage and who is prescribing doctor  Current Outpatient Medications on File Prior to Visit  Medication Sig Dispense Refill   alendronate (FOSAMAX) 70 MG tablet Take 1 tablet (70 mg total) by mouth every 7 (seven) days. Take with a full glass of water on an empty stomach. 12 tablet 1   amLODipine (NORVASC) 5 MG tablet Take 1 tablet by mouth once daily 90 tablet 0   Calcium Carbonate-Vitamin D (CALCIUM 600+D PO) Take 1 tablet by mouth daily.     cetirizine (ZYRTEC) 10 MG tablet Take 1 tablet by mouth once daily 90 tablet 1   Ergocalciferol (VITAMIN D2) 10 MCG (400 UNIT) TABS Take 400 Units by mouth daily.     ezetimibe (ZETIA) 10 MG tablet Take 1 tablet (10 mg total) by mouth daily. 90 tablet 3   lisinopril (ZESTRIL) 40 MG tablet Take 1  tablet (40 mg total) by mouth daily. 90 tablet 2   magnesium (MAGTAB) 84 MG ( ) TBCR SR tablet Take 84 mg by mouth.     rosuvastatin (CRESTOR) 10 MG tablet Take 1 tablet by mouth once daily 90 tablet 0   vitamin B-12 (CYANOCOBALAMIN) 500 MCG tablet Take 500 mcg by mouth daily.      No current facility-administered medications on file prior to visit.    Allergies  Allergen Reactions   Asa [Aspirin] Hives and Swelling   Statins     "made me feel funny"     Pharmacy: [facility]  Primary Insurance Name: [employer] number where you can be reached: [phone]

## 2023-10-02 NOTE — Telephone Encounter (Signed)
Room 3 Thanks 

## 2023-10-06 NOTE — Telephone Encounter (Signed)
 Left message to return call

## 2023-10-09 MED ORDER — PEG 3350-KCL-NA BICARB-NACL 420 G PO SOLR: 4000.0000 mL | Freq: Once | ORAL | 0 refills | Status: AC

## 2023-10-09 NOTE — Telephone Encounter (Signed)
 Pt returned call. Pt scheduled for 11/07/23. Prep sent to pharmacy. No pa needed per insurance. Will mail instructions once pre op is received.

## 2023-10-09 NOTE — Addendum Note (Signed)
 Addended by: Kimmarie Pascale on: 10/09/2023 07:58 AM   Modules accepted: Orders

## 2023-10-09 NOTE — Telephone Encounter (Signed)
 Questionnaire from recall, no referral needed

## 2023-10-12 ENCOUNTER — Ambulatory Visit: Payer: Medicare Other | Admitting: Family Medicine

## 2023-10-12 ENCOUNTER — Ambulatory Visit: Payer: Medicare Other | Admitting: Family

## 2023-10-12 ENCOUNTER — Encounter: Payer: Self-pay | Admitting: Family Medicine

## 2023-10-12 VITALS — BP 136/74 | HR 79 | Temp 99.2°F | Ht 66.0 in | Wt 153.4 lb

## 2023-10-12 DIAGNOSIS — J101 Influenza due to other identified influenza virus with other respiratory manifestations: Secondary | ICD-10-CM

## 2023-10-12 DIAGNOSIS — R52 Pain, unspecified: Secondary | ICD-10-CM | POA: Diagnosis not present

## 2023-10-12 LAB — VERITOR FLU A/B WAIVED
Influenza A: POSITIVE — AB
Influenza B: NEGATIVE

## 2023-10-12 MED ORDER — OSELTAMIVIR PHOSPHATE 75 MG PO CAPS: 75.0000 mg | ORAL_CAPSULE | Freq: Two times a day (BID) | ORAL | 0 refills | Status: AC

## 2023-10-12 NOTE — Patient Instructions (Signed)
to add moisture to the air in your home. This can make it easier for you to breathe. You should also clean the humidifier every day. To do so: Empty the water. Pour clean water in. Cover your mouth and nose when you cough or sneeze. Wash your hands with soap and water often and for at least 20 seconds. It's extra important to do so after you cough or sneeze. If you can't use soap and water, use hand sanitizer. How is this prevented?  Get a flu shot every year. Ask your provider when you should get your flu  shot. Stay away from people who are sick during fall and winter. Fall and winter are cold and flu season. Contact a health care provider if: You get new symptoms. You have chest pain. You have watery poop, also called diarrhea. You have a fever. Your cough gets worse. You start to have more mucus. You feel like you may vomit, or you vomit. Get help right away if: You become short of breath or have trouble breathing. Your skin or nails turn blue. You have very bad pain or stiffness in your neck. You get a sudden headache or pain in your face or ear. You vomit each time you eat or drink. These symptoms may be an emergency. Call 911 right away. Do not wait to see if the symptoms will go away. Do not drive yourself to the hospital. This information is not intended to replace advice given to you by your health care provider. Make sure you discuss any questions you have with your health care provider. Document Revised: 05/18/2023 Document Reviewed: 09/22/2022 Elsevier Patient Education  2024 [employer].

## 2023-10-12 NOTE — Progress Notes (Signed)
Acute Office Visit  Subjective:     Patient ID: Erin Khan, female    DOB: Jun 14, 1941, 83 y.o.   MRN: [id number]  Chief Complaint  Patient presents with   Cough   Generalized Body Aches    Cough This is a new problem. Episode onset: 2 days. The problem has been unchanged. The cough is Non-productive. Associated symptoms include chills, myalgias, nasal congestion, rhinorrhea, a sore throat and shortness of breath (mild, intermittent). Pertinent negatives include no chest pain, ear congestion, ear pain, fever, headaches or wheezing. She has tried nothing for the symptoms. There is no history of asthma, bronchitis, COPD, emphysema or pneumonia.    Review of Systems  Constitutional:  Positive for chills. Negative for fever.  HENT:  Positive for rhinorrhea and sore throat. Negative for ear pain.   Respiratory:  Positive for cough and shortness of breath (mild, intermittent). Negative for wheezing.   Cardiovascular:  Negative for chest pain.  Musculoskeletal:  Positive for myalgias.  Neurological:  Negative for headaches.        Objective:    BP 136/74   Pulse 79   Temp 99.2 F (37.3 C) (Temporal)   Ht 5\' 6"  (1.676 m)   Wt 153 lb 6.4 oz (69.6 kg)   SpO2 95%   BMI 24.76 kg/m    Physical Exam Vitals and nursing note reviewed.  Constitutional:      General: She is not in acute distress.    Appearance: Normal appearance. She is not ill-appearing, toxic-appearing or diaphoretic.  HENT:     Head: Normocephalic and atraumatic.     Right Ear: Tympanic membrane, ear canal and external ear normal.     Left Ear: Tympanic membrane, ear canal and external ear normal.     Nose: Congestion present.     Mouth/Throat:     Mouth: Mucous membranes are moist.     Pharynx: Oropharynx is clear. No oropharyngeal exudate or posterior oropharyngeal erythema.  Eyes:     General:        Right eye: No discharge.        Left eye: No discharge.     Conjunctiva/sclera: Conjunctivae normal.   Cardiovascular:     Rate and Rhythm: Normal rate and regular rhythm.     Pulses: Normal pulses.     Heart sounds: Normal heart sounds. No murmur heard. Pulmonary:     Effort: Pulmonary effort is normal. No respiratory distress.     Breath sounds: Normal breath sounds.  Abdominal:     General: Bowel sounds are normal. There is no distension.     Palpations: Abdomen is soft. There is no mass.     Tenderness: There is no abdominal tenderness. There is no guarding or rebound.  Musculoskeletal:     Cervical back: Neck supple. No tenderness.     Right lower leg: No edema.     Left lower leg: No edema.  Lymphadenopathy:     Cervical: No cervical adenopathy.  Skin:    General: Skin is warm and dry.  Neurological:     General: No focal deficit present.     Mental Status: She is alert and oriented to person, place, and time.  Psychiatric:        Mood and Affect: Mood normal.        Behavior: Behavior normal.     No results found for any visits on 10/12/23.      Assessment & Plan:   Erin Khan was seen  today for cough and generalized body aches.  Diagnoses and all orders for this visit:  Body aches -     Veritor Flu A/B Waived  Influenza A -     oseltamivir (TAMIFLU) 75 MG capsule; Take 1 capsule (75 mg total) by mouth 2 (two) times daily for 5 days.   + Flu A. Tamiflu discussed and ordered. Discussed symptomatic care and return precautions.   The patient indicates understanding of these issues and agrees with the plan.  Erin Earing, FNP

## 2023-10-18 ENCOUNTER — Encounter: Payer: Self-pay | Admitting: Family Medicine

## 2023-10-18 ENCOUNTER — Ambulatory Visit: Payer: Self-pay | Admitting: Family

## 2023-10-18 ENCOUNTER — Ambulatory Visit (INDEPENDENT_AMBULATORY_CARE_PROVIDER_SITE_OTHER): Payer: Medicare Other | Admitting: Family Medicine

## 2023-10-18 ENCOUNTER — Telehealth: Payer: Self-pay | Admitting: Family

## 2023-10-18 VITALS — BP 138/76 | HR 84 | Temp 97.7°F | Ht 66.0 in | Wt 151.0 lb

## 2023-10-18 DIAGNOSIS — J209 Acute bronchitis, unspecified: Secondary | ICD-10-CM

## 2023-10-18 MED ORDER — AMOXICILLIN-POT CLAVULANATE 875-125 MG PO TABS: 1.0000 | ORAL_TABLET | Freq: Two times a day (BID) | ORAL | 0 refills | Status: DC

## 2023-10-18 MED ORDER — ALBUTEROL SULFATE HFA 108 (90 BASE) MCG/ACT IN AERS: 2.0000 | INHALATION_SPRAY | Freq: Four times a day (QID) | RESPIRATORY_TRACT | 0 refills | Status: DC | PRN

## 2023-10-18 NOTE — Addendum Note (Signed)
 Addended by: Ival Bible on: 10/18/2023 09:07 AM   Modules accepted: Orders, Level of Service

## 2023-10-18 NOTE — Telephone Encounter (Signed)
  Chief Complaint: influenza not improved on Tamiflu; cough Symptoms: cough, runny nose, headaches, low grade fever, sore throat, mild SOB after coughing and mild chest pain/sore after coughing Frequency: x 1 week Pertinent Negatives: Patient denies vomiting, nausea, diarrhea Disposition: [] ED /[] Urgent Care (no appt availability in office) / [x] Appointment(In office/virtual)/ []  [facility]/ [] Home Care/ [] Refused Recommended Disposition /[] [facility]/ []  Follow-up with PCP Additional Notes: Patient diagnosed with Influenza A on 10/12/23 and placed on Tamiflu. She states she feels like her cough has not improved. Patient able to speak in full sentences, no audible wheezing, coughing or distress noted She is agreeable to appointment in office tomorrow with PCP. Advised patient to wear a mask for her appointment.  Copied from CRM [id number]. Topic: Clinical - Medical Advice >> Oct 18, 2023  8:18 AM Clide Dales wrote: Patient's husband states that patient tested positive for the Flu on 2/13 and was given oseltamivir (TAMIFLU) 75 MG capsule. Patient's husband said that patient is not feeling any better. Patient would like to be seen in office. No appointments available until Friday, 2/21. Please advise. Reason for Disposition  [1] HIGH RISK (e.g., age > 64 years, pregnant, HIV+, or chronic medical condition) AND [2]  > 72 hours (3 days) since evaluated by doctor (or NP/PA) AND [3] symptoms not improved  Answer Assessment - Initial Assessment Questions 1. DIAGNOSIS CONFIRMATION: "When was the influenza diagnosed?" "By whom?" "Did you get a test for it?"     Harlow Mares, 10/12/23. Positive for Flu A on 10/12/23.  2. MEDICINES: "Were you prescribed any medications for the influenza?"  (e.g., zanamivir [Relenza], oseltamavir [Tamiflu]).      Tamiflu.  3. ONSET of SYMPTOMS: "When did your symptoms start?"     "About a week ago or something like that"  4. SYMPTOMS: "What symptoms  are you most concerned about?" (e.g., runny nose, stuffy nose, sore throat, cough, breathing difficulty, fever)     Cough.  5. COUGH: "How bad is the cough?"     Hacking cough, denies it being productive.  6. FEVER: "Do you have a fever?" If Yes, ask: "What is your temperature, how was it measured, and when did it start?"     Low grade fevers, She states she checked it once or twice and it was in the 99 range.  7. RESPIRATORY DISTRESS: "Are you having any trouble breathing?" If Yes, ask: "Describe your breathing."      "Just maybe a little bit".  8. FLU VACCINE: "Did you receive a flu shot this year?" (e.g., seasonal influenza, H1N1)     Yes.  9. HIGH RISK for COMPLICATIONS: "Do you have any heart or lung problems? Do you have a weakened immune system?" (e.g., CHF, COPD, asthma, HIV positive, chemotherapy, renal failure, diabetes mellitus, sickle cell anemia)       Denies.  Protocols used: Influenza (Flu) Follow-up Call-A-[facility]

## 2023-10-18 NOTE — Patient Instructions (Signed)
 ReCommended that she take the Augmentin and albuterol and then use some Mucinex as well.

## 2023-10-18 NOTE — Telephone Encounter (Signed)
 See nurse triage from today.This encounter was created in error - please disregard.

## 2023-10-18 NOTE — Telephone Encounter (Signed)
 Copied from CRM [id number]. Topic: Clinical - Medical Advice >> Oct 18, 2023  8:18 AM Clide Dales wrote: Patient's husband states that patient tested positive for the Flu on 2/13 and was given oseltamivir (TAMIFLU) 75 MG capsule. Patient's husband said that patient is not feeling any better. Patient would like to be seen in office. No appointments available until Friday, 2/21. Please advise.

## 2023-10-18 NOTE — Progress Notes (Signed)
 BP 138/76   Pulse 84   Temp 97.7 F (36.5 C)   Ht 5\' 6"  (1.676 m)   Wt 151 lb (68.5 kg)   SpO2 95%   BMI 24.37 kg/m    Subjective:   Patient ID: Erin Khan, female    DOB: Apr 04, 1941, 83 y.o.   MRN: [id number]  HPI: Erin Khan is a 83 y.o. female presenting on 10/18/2023 for Cough (Cough with low grade fever at home, body aches. Started about a week ago.)   HPI Cough and congestion and low-grade fever and body aches Patient is coming in today for cough and congestion and low-grade fever and bodyaches.  She says she started with this about a week ago and came and was diagnosed with flu and took the full course of Tamiflu which she finished yesterday and she still feels like she is having a lot of chest congestion and cough and nasal congestion and she is coughing so much that she has some chest pains with it when she is coughing.  She is taking some Tylenol.  She says she has not had any true fever but just low-grade and feeling warm.  She does not have any history of lung disease.  She is not taking anything over-the-counter for it.  Relevant past medical, surgical, family and social history reviewed and updated as indicated. Interim medical history since our last visit reviewed. Allergies and medications reviewed and updated.  Review of Systems  Constitutional:  Positive for chills and fever.  HENT:  Positive for congestion, postnasal drip and rhinorrhea. Negative for ear discharge, ear pain, sinus pressure, sneezing and sore throat.   Eyes:  Negative for pain, redness and visual disturbance.  Respiratory:  Positive for cough and wheezing. Negative for chest tightness and shortness of breath.   Cardiovascular:  Negative for chest pain and leg swelling.  Genitourinary:  Negative for difficulty urinating and dysuria.  Musculoskeletal:  Positive for myalgias. Negative for back pain and gait problem.  Skin:  Negative for rash.  Neurological:  Negative for light-headedness and  headaches.  Psychiatric/Behavioral:  Negative for agitation and behavioral problems.   All other systems reviewed and are negative.   Per HPI unless specifically indicated above   Allergies as of 10/18/2023       Reactions   Asa [aspirin] Hives, Swelling   Statins    "made me feel funny"        Medication List        Accurate as of October 18, 2023 10:41 AM. If you have any questions, ask your nurse or doctor.          albuterol 108 (90 Base) MCG/ACT inhaler Commonly known as: VENTOLIN HFA Inhale 2 puffs into the lungs every 6 (six) hours as needed for wheezing or shortness of breath. Started by: Elige Radon Cashawn Yanko   alendronate 70 MG tablet Commonly known as: Fosamax Take 1 tablet (70 mg total) by mouth every 7 (seven) days. Take with a full glass of water on an empty stomach.   amLODipine 5 MG tablet Commonly known as: NORVASC Take 1 tablet by mouth once daily   amoxicillin-clavulanate 875-125 MG tablet Commonly known as: AUGMENTIN Take 1 tablet by mouth 2 (two) times daily. Started by: Elige Radon Delmon Andrada   CALCIUM 600+D PO Take 1 tablet by mouth daily.   cetirizine 10 MG tablet Commonly known as: ZYRTEC Take 1 tablet by mouth once daily   ezetimibe 10 MG tablet Commonly known  as: ZETIA Take 1 tablet (10 mg total) by mouth daily.   lisinopril 40 MG tablet Commonly known as: ZESTRIL Take 1 tablet (40 mg total) by mouth daily.   magnesium 84 MG ( ) Tbcr SR tablet Commonly known as: MAGTAB Take 84 mg by mouth.   rosuvastatin 10 MG tablet Commonly known as: CRESTOR Take 1 tablet by mouth once daily   vitamin B-12 500 MCG tablet Commonly known as: CYANOCOBALAMIN Take 500 mcg by mouth daily.   Vitamin D2 10 MCG (400 UNIT) Tabs Take 400 Units by mouth daily.         Objective:   BP 138/76   Pulse 84   Temp 97.7 F (36.5 C)   Ht 5\' 6"  (1.676 m)   Wt 151 lb (68.5 kg)   SpO2 95%   BMI 24.37 kg/m   Wt Readings from Last 3  Encounters:  10/18/23 151 lb (68.5 kg)  10/12/23 153 lb 6.4 oz (69.6 kg)  04/10/23 153 lb 6.4 oz (69.6 kg)    Physical Exam Vitals and nursing note reviewed.  Constitutional:      General: She is not in acute distress.    Appearance: She is well-developed. She is not diaphoretic.  Eyes:     Conjunctiva/sclera: Conjunctivae normal.  Cardiovascular:     Rate and Rhythm: Normal rate and regular rhythm.     Heart sounds: Normal heart sounds. No murmur heard. Pulmonary:     Effort: Pulmonary effort is normal. No respiratory distress.     Breath sounds: Rhonchi and rales (Right posterior lower lobe) present. No wheezing.  Musculoskeletal:        General: No tenderness. Normal range of motion.  Skin:    General: Skin is warm and dry.     Findings: No rash.  Neurological:     Mental Status: She is alert and oriented to person, place, and time.     Coordination: Coordination normal.  Psychiatric:        Behavior: Behavior normal.       Assessment & Plan:   Problem List Items Addressed This Visit   None Visit Diagnoses       Acute bronchitis, unspecified organism    -  Primary   Relevant Medications   amoxicillin-clavulanate (AUGMENTIN) 875-125 MG tablet   albuterol (VENTOLIN HFA) 108 (90 Base) MCG/ACT inhaler       ReCommended that she take the Augmentin and albuterol and then use some Mucinex as well. Follow up plan: Return if symptoms worsen or fail to improve.  Counseling provided for all of the vaccine components No orders of the defined types were placed in this encounter.   Arville Care, MD [facility] 10/18/2023, 10:41 AM

## 2023-10-19 ENCOUNTER — Ambulatory Visit: Payer: Medicare Other | Admitting: Family

## 2023-10-24 ENCOUNTER — Encounter: Payer: Self-pay | Admitting: Family

## 2023-10-24 ENCOUNTER — Ambulatory Visit: Payer: Medicare Other | Admitting: Family

## 2023-10-24 VITALS — BP 154/71 | HR 76 | Temp 97.3°F | Ht 66.0 in | Wt 151.0 lb

## 2023-10-24 DIAGNOSIS — Z0000 Encounter for general adult medical examination without abnormal findings: Secondary | ICD-10-CM

## 2023-10-24 DIAGNOSIS — E785 Hyperlipidemia, unspecified: Secondary | ICD-10-CM

## 2023-10-24 DIAGNOSIS — J209 Acute bronchitis, unspecified: Secondary | ICD-10-CM | POA: Diagnosis not present

## 2023-10-24 DIAGNOSIS — Z960 Presence of urogenital implants: Secondary | ICD-10-CM | POA: Diagnosis not present

## 2023-10-24 DIAGNOSIS — M810 Age-related osteoporosis without current pathological fracture: Secondary | ICD-10-CM | POA: Diagnosis not present

## 2023-10-24 DIAGNOSIS — N1832 Chronic kidney disease, stage 3b: Secondary | ICD-10-CM | POA: Diagnosis not present

## 2023-10-24 DIAGNOSIS — I10 Essential (primary) hypertension: Secondary | ICD-10-CM | POA: Diagnosis not present

## 2023-10-24 DIAGNOSIS — I129 Hypertensive chronic kidney disease with stage 1 through stage 4 chronic kidney disease, or unspecified chronic kidney disease: Secondary | ICD-10-CM

## 2023-10-24 DIAGNOSIS — Z0001 Encounter for general adult medical examination with abnormal findings: Secondary | ICD-10-CM | POA: Diagnosis not present

## 2023-10-24 LAB — LIPID PANEL

## 2023-10-24 MED ORDER — PREDNISONE 10 MG (21) PO TBPK: ORAL_TABLET | ORAL | 0 refills | Status: DC

## 2023-10-24 MED ORDER — ALBUTEROL SULFATE HFA 108 (90 BASE) MCG/ACT IN AERS: 2.0000 | INHALATION_SPRAY | Freq: Four times a day (QID) | RESPIRATORY_TRACT | 0 refills | Status: AC | PRN

## 2023-10-24 NOTE — Patient Instructions (Signed)

## 2023-10-24 NOTE — Progress Notes (Signed)
 Subjective:    Patient ID: Erin Khan, female    DOB: [birth year], 83 y.o.   MRN: [id number]  Chief Complaint  Patient presents with   Medical Management of Chronic Issues   Influenza    Since last Wednesday and she is no better SOB cough     PT presents to the office today for CPE and  chronic follow up. She is followed by GYN every 6 months for pessary maintenance.    She has CKD and avoids NSAID's.   She has osteoporosis and takes Fosamax weekly. Her last Dexa scan was 06/15/21. Hypertension This is a chronic problem. The current episode started more than 1 year ago. The problem has been resolved since onset. The problem is uncontrolled. Associated symptoms include shortness of breath. Pertinent negatives include no headaches, malaise/fatigue or peripheral edema. Risk factors for coronary artery disease include dyslipidemia and sedentary lifestyle. The current treatment provides moderate improvement.  Hyperlipidemia This is a chronic problem. The current episode started more than 1 year ago. The problem is controlled. Recent lipid tests were reviewed and are normal. Associated symptoms include myalgias (minor) and shortness of breath. Current antihyperlipidemic treatment includes ezetimibe and statins. The current treatment provides moderate improvement of lipids. Risk factors for coronary artery disease include dyslipidemia, hypertension, a sedentary lifestyle and post-menopausal.  Influenza This is a new problem. The current episode started 1 to 4 weeks ago. The problem has been waxing and waning. Associated symptoms include congestion, coughing, myalgias (minor) and weakness. Pertinent negatives include no chills, fever, headaches, sore throat or urinary symptoms. She has tried rest and sleep for the symptoms. The treatment provided mild relief.      Review of Systems  Constitutional:  Negative for chills, fever and malaise/fatigue.  HENT:  Positive for congestion. Negative for  sore throat.   Respiratory:  Positive for cough and shortness of breath.   Musculoskeletal:  Positive for myalgias (minor).  Neurological:  Positive for weakness. Negative for headaches.  All other systems reviewed and are negative.  Family History  Problem Relation Age of Onset   Hypertension Father    Lung cancer Sister    Arthritis Sister    Heart attack Brother 57   Heart disease Brother        No details   Asthma Brother    Healthy Daughter    Healthy Son    Colon cancer Neg Hx    Social History   Socioeconomic History   Marital status: Married    Spouse name: [name]   Number of children: 2   Years of education: 12   Highest education level: 12th grade  Occupational History   Occupation: Retired    Comment: Retail banker   Occupation: Garden Comptroller: [employer]    Comment: 20 to 25 hours a week part time  Tobacco Use   Smoking status: Never    Passive exposure: Never   Smokeless tobacco: Never  Vaping Use   Vaping status: Never Used  Substance and Sexual Activity   Alcohol use: No   Drug use: No   Sexual activity: Yes  Other Topics Concern   Not on file  Social History Narrative   She lives with her husband, [name].   She  has eight grandchildren.   Social Drivers of Corporate investment banker Strain: Low Risk  (12/26/2022)   Overall Financial Resource Strain (CARDIA)    Difficulty of Paying Living Expenses: Not hard at all  Food Insecurity: No Food Insecurity (12/26/2022)   Hunger Vital Sign    Worried About Running Out of Food in the Last Year: Never true    Ran Out of Food in the Last Year: Never true  Transportation Needs: No Transportation Needs (12/26/2022)   PRAPARE - Administrator, Civil Service (Medical): No    Lack of Transportation (Non-Medical): No  Physical Activity: Insufficiently Active (12/26/2022)   Exercise Vital Sign    Days of Exercise per Week: 3 days    Minutes of Exercise per Session: 30 min  Stress: No  Stress Concern Present (12/26/2022)   [employer] of Occupational Health - Occupational Stress Questionnaire    Feeling of Stress : Not at all  Social Connections: Moderately Integrated (12/26/2022)   Social Connection and Isolation Panel [NHANES]    Frequency of Communication with Friends and Family: More than three times a week    Frequency of Social Gatherings with Friends and Family: More than three times a week    Attends Religious Services: More than 4 times per year    Active Member of [employer] or Organizations: No    Attends Banker Meetings: Never    Marital Status: Married       Objective:   Physical Exam Vitals reviewed.  Constitutional:      General: She is not in acute distress.    Appearance: She is well-developed.  HENT:     Head: Normocephalic and atraumatic.     Right Ear: Tympanic membrane normal.     Left Ear: Tympanic membrane normal.  Eyes:     Pupils: Pupils are equal, round, and reactive to light.  Neck:     Thyroid: No thyromegaly.  Cardiovascular:     Rate and Rhythm: Normal rate and regular rhythm.     Heart sounds: Normal heart sounds. No murmur heard. Pulmonary:     Effort: Pulmonary effort is normal. No respiratory distress.     Breath sounds: Normal breath sounds. No wheezing.  Abdominal:     General: Bowel sounds are normal. There is no distension.     Palpations: Abdomen is soft.     Tenderness: There is no abdominal tenderness.  Musculoskeletal:        General: No tenderness. Normal range of motion.     Cervical back: Normal range of motion and neck supple.  Skin:    General: Skin is warm and dry.  Neurological:     Mental Status: She is alert and oriented to person, place, and time.     Cranial Nerves: No cranial nerve deficit.     Deep Tendon Reflexes: Reflexes are normal and symmetric.  Psychiatric:        Behavior: Behavior normal.        Thought Content: Thought content normal.        Judgment: Judgment normal.        BP (!) 154/71   Pulse 76   Temp (!) 97.3 F (36.3 C) (Temporal)   Ht 5\' 6"  (1.676 m)   Wt 151 lb (68.5 kg)   SpO2 95%   BMI 24.37 kg/m      Assessment & Plan:  Erin Khan comes in today with chief complaint of Medical Management of Chronic Issues and Influenza (Since last Wednesday and she is no better SOB cough )   Diagnosis and orders addressed:  1. Annual physical exam (Primary) - DG [facility] DEXA; Future - CMP14+EGFR - CBC with Differential/Platelet -  Lipid panel  2. Primary hypertension - CMP14+EGFR  3. Hyperlipidemia, unspecified hyperlipidemia type - CMP14+EGFR - Lipid panel  4. Stage 3b chronic kidney disease (HCC) - CMP14+EGFR  5. Age-related osteoporosis without current pathological fracture - DG [facility] DEXA; Future - CMP14+EGFR  6. Presence of pessary - CMP14+EGFR  7. Acute bronchitis, unspecified organism - Take meds as prescribed - Use a cool mist humidifier  -Use saline nose sprays frequently -Force fluids -For any cough or congestion  Use plain Mucinex- regular strength or max strength is fine -For fever or aces or pains- take tylenol or ibuprofen. -Throat lozenges if help -Follow up if symptoms worsen or do not improve  - CMP14+EGFR - albuterol (VENTOLIN HFA) 108 (90 Base) MCG/ACT inhaler; Inhale 2 puffs into the lungs every 6 (six) hours as needed for wheezing or shortness of breath.  Dispense: 8 g; Refill: 0 - predniSONE (STERAPRED UNI-PAK 21 TAB) 10 MG (21) TBPK tablet; Use as directed  Dispense: 21 tablet; Refill: 0   Labs pending Continue current medications  Health Maintenance reviewed Diet and exercise encouraged  Follow up plan: 6 months   Jannifer Rodney, FNP

## 2023-10-25 ENCOUNTER — Telehealth (INDEPENDENT_AMBULATORY_CARE_PROVIDER_SITE_OTHER): Payer: Self-pay | Admitting: Gastroenterology

## 2023-10-25 LAB — CMP14+EGFR
ALT: 12 IU/L (ref 0–32)
AST: 10 IU/L (ref 0–40)
Albumin: 4.4 g/dL (ref 3.7–4.7)
Alkaline Phosphatase: 54 IU/L (ref 44–121)
BUN/Creatinine Ratio: 26 (ref 12–28)
BUN: 32 mg/dL — ABNORMAL HIGH (ref 8–27)
Bilirubin Total: 0.3 mg/dL (ref 0.0–1.2)
CO2: 19 mmol/L — ABNORMAL LOW (ref 20–29)
Calcium: 9.8 mg/dL (ref 8.7–10.3)
Chloride: 105 mmol/L (ref 96–106)
Creatinine, Ser: 1.23 mg/dL — ABNORMAL HIGH (ref 0.57–1.00)
Globulin, Total: 2.4 g/dL (ref 1.5–4.5)
Glucose: 86 mg/dL (ref 70–99)
Potassium: 4.9 mmol/L (ref 3.5–5.2)
Sodium: 141 mmol/L (ref 134–144)
Total Protein: 6.8 g/dL (ref 6.0–8.5)
eGFR: 44 mL/min/{1.73_m2} — ABNORMAL LOW (ref >59)

## 2023-10-25 LAB — CBC WITH DIFFERENTIAL/PLATELET
Basophils Absolute: 0.1 10*3/uL (ref 0.0–0.2)
Basos: 1 %
EOS (ABSOLUTE): 0.1 10*3/uL (ref 0.0–0.4)
Eos: 1 %
Hematocrit: 40.4 % (ref 34.0–46.6)
Hemoglobin: 13.2 g/dL (ref 11.1–15.9)
Immature Grans (Abs): 0 10*3/uL (ref 0.0–0.1)
Immature Granulocytes: 0 %
Lymphocytes Absolute: 1.8 10*3/uL (ref 0.7–3.1)
Lymphs: 22 %
MCH: 30.9 pg (ref 26.6–33.0)
MCHC: 32.7 g/dL (ref 31.5–35.7)
MCV: 95 fL (ref 79–97)
Monocytes Absolute: 0.8 10*3/uL (ref 0.1–0.9)
Monocytes: 10 %
Neutrophils Absolute: 5.1 10*3/uL (ref 1.4–7.0)
Neutrophils: 66 %
Platelets: 233 10*3/uL (ref 150–450)
RBC: 4.27 x10E6/uL (ref 3.77–5.28)
RDW: 12.6 % (ref 11.7–15.4)
WBC: 7.9 10*3/uL (ref 3.4–10.8)

## 2023-10-25 LAB — LIPID PANEL
Cholesterol, Total: 134 mg/dL (ref 100–199)
HDL: 38 mg/dL — ABNORMAL LOW (ref >39)
LDL CALC COMMENT:: 3.5 ratio (ref 0.0–4.4)
LDL Chol Calc (NIH): 51 mg/dL (ref 0–99)
Triglycerides: 293 mg/dL — ABNORMAL HIGH (ref 0–149)
VLDL Cholesterol Cal: 45 mg/dL — ABNORMAL HIGH (ref 5–40)

## 2023-10-25 NOTE — Telephone Encounter (Signed)
 Thanks

## 2023-10-25 NOTE — Telephone Encounter (Signed)
 Pt called into office and states she is needing to cancel her TCS for 11/07/23 with Dr.Castaneda due to having flu. Pt states she will call back to reschedule.

## 2023-11-01 ENCOUNTER — Ambulatory Visit: Payer: Self-pay | Admitting: Family

## 2023-11-01 NOTE — Telephone Encounter (Signed)
 Copied from CRM [id number]. Topic: Clinical - Red Word Triage >> Nov 01, 2023 12:18 PM Albin Felling L wrote: Red Word that prompted transfer to Nurse Triage:  Congestion in chest and difficulty breathing.   Requesting appointment. Reason for Disposition  [1] MILD difficulty breathing (e.g., minimal/no SOB at rest, SOB with walking, pulse <100) AND [2] NEW-onset or WORSE than normal  Answer Assessment - Initial Assessment Questions 1. RESPIRATORY STATUS: "Describe your breathing?" (e.g., wheezing, shortness of breath, unable to speak, severe coughing)      I was there 2 weeks ago and saw Dr. Lendon Colonel and another dr.   Rudean Curt not getting better.   I've taken all my medication and I'm still not better. 2. ONSET: "When did this breathing problem begin?"      I have not improved that much in the last 2 weeks. 3. PATTERN "Does the difficult breathing come and go, or has it been constant since it started?"      When I'm up and around I get short of breath 4. SEVERITY: "How bad is your breathing?" (e.g., mild, moderate, severe)    - MILD: No SOB at rest, mild SOB with walking, speaks normally in sentences, can lie down, no retractions, pulse < 100.    - MODERATE: SOB at rest, SOB with minimal exertion and prefers to sit, cannot lie down flat, speaks in phrases, mild retractions, audible wheezing, pulse 100-120.    - SEVERE: Very SOB at rest, speaks in single words, struggling to breathe, sitting hunched forward, retractions, pulse > 120      MIld 5. RECURRENT SYMPTOM: "Have you had difficulty breathing before?" If Yes, ask: "When was the last time?" and "What happened that time?"      No 6. CARDIAC HISTORY: "Do you have any history of heart disease?" (e.g., heart attack, angina, bypass surgery, angioplasty)      Not asked 7. LUNG HISTORY: "Do you have any history of lung disease?"  (e.g., pulmonary embolus, asthma, emphysema)     Not asked 8. CAUSE: "What do you think is causing the breathing problem?"      I've  been sick for a while now 9. OTHER SYMPTOMS: "Do you have any other symptoms? (e.g., dizziness, runny nose, cough, chest pain, fever)     Coughing a lot at night.   Not coughing up much 10. O2 SATURATION MONITOR:  "Do you use an oxygen saturation monitor (pulse oximeter) at home?" If Yes, ask: "What is your reading (oxygen level) today?" "What is your usual oxygen saturation reading?" (e.g., 95%)       N/A 11. PREGNANCY: "Is there any chance you are pregnant?" "When was your last menstrual period?"       N/A 12. TRAVEL: "Have you traveled out of the country in the last month?" (e.g., travel history, exposures)       N/A  Protocols used: Breathing Difficulty-A-AH  Chief Complaint: Shortness of breath and chest congestion not getting better after 2 weeks. Symptoms: above Frequency: above Pertinent Negatives: Patient denies medications prescribed as helping Disposition: [] ED /[] Urgent Care (no appt availability in office) / [x] Appointment(In office/virtual)/ []  [facility]/ [] Home Care/ [] Refused Recommended Disposition /[] [facility]/ []  Follow-up with PCP Additional Notes: Appt made

## 2023-11-02 ENCOUNTER — Ambulatory Visit

## 2023-11-02 ENCOUNTER — Encounter: Payer: Self-pay | Admitting: Family Medicine

## 2023-11-02 VITALS — BP 149/70 | HR 75 | Temp 97.8°F | Ht 66.0 in | Wt 150.0 lb

## 2023-11-02 DIAGNOSIS — R062 Wheezing: Secondary | ICD-10-CM | POA: Diagnosis not present

## 2023-11-02 DIAGNOSIS — I10 Essential (primary) hypertension: Secondary | ICD-10-CM | POA: Diagnosis not present

## 2023-11-02 DIAGNOSIS — R0989 Other specified symptoms and signs involving the circulatory and respiratory systems: Secondary | ICD-10-CM

## 2023-11-02 DIAGNOSIS — R051 Acute cough: Secondary | ICD-10-CM

## 2023-11-02 MED ORDER — PREDNISONE 20 MG PO TABS: 40.0000 mg | ORAL_TABLET | Freq: Every day | ORAL | 0 refills | Status: AC

## 2023-11-02 MED ORDER — AZITHROMYCIN 250 MG PO TABS: ORAL_TABLET | ORAL | 0 refills | Status: AC

## 2023-11-02 NOTE — Progress Notes (Signed)
 Subjective:  Patient ID: Erin Khan, female    DOB: 12-22-1940, 83 y.o.   MRN: [id number]  Patient Care Team: Junie Spencer, FNP as PCP - General (Family Medicine) Huel Cote, MD as Consulting Physician (Obstetrics and Gynecology) Malissa Hippo, MD (Inactive) as Consulting Physician (Gastroenterology) Danella Maiers, [facility] as [facility] Care Management (Pharmacist)   Chief Complaint:  cough and congestion (X 2 weeks)   HPI: Erin Khan is a 83 y.o. female presenting on 11/02/2023 for cough and congestion (X 2 weeks)  States that she is still not feeling well. She has completed Augmentin. She continues to have SOB, chest congestion, cough, body aches. Denies n/v, ear pain, teeth pain, sinus pressure. Reports low grade fever, fatigue, shortness of breath even with sitting. She is not taking anything OTC other than tylenol. Using albuterol 2-3 times per day.   Relevant past medical, surgical, family, and social history reviewed and updated as indicated.  Allergies and medications reviewed and updated. Data reviewed: Chart in Epic.   Past Medical History:  Diagnosis Date   Cataract    Colon polyps    Hypercholesteremia    Hypertension     Past Surgical History:  Procedure Laterality Date   ABDOMINAL HYSTERECTOMY     BILATERAL OOPHORECTOMY     CATARACT EXTRACTION, BILATERAL  2014   CHOLECYSTECTOMY  1980   COLONOSCOPY     COLONOSCOPY N/A 05/15/2013   Procedure: COLONOSCOPY;  Surgeon: Malissa Hippo, MD;  [facility];  Service: Endoscopy;  Laterality: N/A;  930   COLONOSCOPY N/A 10/18/2018   Procedure: COLONOSCOPY;  Surgeon: Malissa Hippo, MD;  [facility];  Service: Endoscopy;  Laterality: N/A;  830   cranial shunt     17 years ago   POLYPECTOMY  10/18/2018   Procedure: POLYPECTOMY;  Surgeon: Malissa Hippo, MD;  [facility];  Service: Endoscopy;;  colon    Social History   Socioeconomic History   Marital  status: Married    Spouse name: [name]   Number of children: 2   Years of education: 12   Highest education level: 12th grade  Occupational History   Occupation: Retired    Comment: Retail banker   Occupation: Garden Comptroller: [employer]    Comment: 20 to 25 hours a week part time  Tobacco Use   Smoking status: Never    Passive exposure: Never   Smokeless tobacco: Never  Vaping Use   Vaping status: Never Used  Substance and Sexual Activity   Alcohol use: No   Drug use: No   Sexual activity: Yes  Other Topics Concern   Not on file  Social History Narrative   She lives with her husband, [name].   She  has eight grandchildren.   Social Drivers of Corporate investment banker Strain: Low Risk  (12/26/2022)   Overall Financial Resource Strain (CARDIA)    Difficulty of Paying Living Expenses: Not hard at all  Food Insecurity: No Food Insecurity (12/26/2022)   Hunger Vital Sign    Worried About Running Out of Food in the Last Year: Never true    Ran Out of Food in the Last Year: Never true  Transportation Needs: No Transportation Needs (12/26/2022)   PRAPARE - Administrator, Civil Service (Medical): No    Lack of Transportation (Non-Medical): No  Physical Activity: Insufficiently Active (12/26/2022)   Exercise Vital Sign  Days of Exercise per Week: 3 days    Minutes of Exercise per Session: 30 min  Stress: No Stress Concern Present (12/26/2022)   [employer] of Occupational Health - Occupational Stress Questionnaire    Feeling of Stress : Not at all  Social Connections: Moderately Integrated (12/26/2022)   Social Connection and Isolation Panel [NHANES]    Frequency of Communication with Friends and Family: More than three times a week    Frequency of Social Gatherings with Friends and Family: More than three times a week    Attends Religious Services: More than 4 times per year    Active Member of [employer] or Organizations: No    Attends Tax inspector Meetings: Never    Marital Status: Married  Catering manager Violence: Not At Risk (12/26/2022)   Humiliation, Afraid, Rape, and Kick questionnaire    Fear of Current or Ex-Partner: No    Emotionally Abused: No    Physically Abused: No    Sexually Abused: No    Outpatient Encounter Medications as of 11/02/2023  Medication Sig   albuterol (VENTOLIN HFA) 108 (90 Base) MCG/ACT inhaler Inhale 2 puffs into the lungs every 6 (six) hours as needed for wheezing or shortness of breath.   alendronate (FOSAMAX) 70 MG tablet Take 1 tablet (70 mg total) by mouth every 7 (seven) days. Take with a full glass of water on an empty stomach.   amLODipine (NORVASC) 5 MG tablet Take 1 tablet by mouth once daily   Calcium Carbonate-Vitamin D (CALCIUM 600+D PO) Take 1 tablet by mouth daily.   cetirizine (ZYRTEC) 10 MG tablet Take 1 tablet by mouth once daily   Ergocalciferol (VITAMIN D2) 10 MCG (400 UNIT) TABS Take 400 Units by mouth daily.   ezetimibe (ZETIA) 10 MG tablet Take 1 tablet (10 mg total) by mouth daily.   lisinopril (ZESTRIL) 40 MG tablet Take 1 tablet (40 mg total) by mouth daily.   magnesium (MAGTAB) 84 MG ( ) TBCR SR tablet Take 84 mg by mouth.   rosuvastatin (CRESTOR) 10 MG tablet Take 1 tablet by mouth once daily   vitamin B-12 (CYANOCOBALAMIN) 500 MCG tablet Take 500 mcg by mouth daily.    [DISCONTINUED] amoxicillin-clavulanate (AUGMENTIN) 875-125 MG tablet Take 1 tablet by mouth 2 (two) times daily.   [DISCONTINUED] predniSONE (STERAPRED UNI-PAK 21 TAB) 10 MG (21) TBPK tablet Use as directed   No facility-administered encounter medications on file as of 11/02/2023.    Allergies  Allergen Reactions   Asa [Aspirin] Hives and Swelling   Statins     "made me feel funny"    Review of Systems As per HPI  Objective:  BP (!) 149/70   Pulse 75   Temp 97.8 F (36.6 C)   Ht 5\' 6"  (1.676 m)   Wt 150 lb (68 kg)   SpO2 97%   BMI 24.21 kg/m    Wt Readings from Last 3  Encounters:  11/02/23 150 lb (68 kg)  10/24/23 151 lb (68.5 kg)  10/18/23 151 lb (68.5 kg)   Physical Exam Constitutional:      General: She is awake. She is not in acute distress.    Appearance: Normal appearance. She is well-developed and well-groomed. She is ill-appearing. She is not toxic-appearing or diaphoretic.  HENT:     Right Ear: A middle ear effusion is present.     Left Ear: A middle ear effusion is present.     Nose: Mucosal edema, congestion and rhinorrhea present.  Rhinorrhea is clear.     Right Nostril: No foreign body, epistaxis, septal hematoma or occlusion.     Left Nostril: No foreign body, epistaxis, septal hematoma or occlusion.     Right Turbinates: Not enlarged, swollen or pale.     Left Turbinates: Not enlarged, swollen or pale.     Right Sinus: No maxillary sinus tenderness or frontal sinus tenderness.     Left Sinus: No maxillary sinus tenderness or frontal sinus tenderness.     Mouth/Throat:     Lips: Pink. No lesions.     Mouth: Mucous membranes are moist.     Tongue: No lesions.     Palate: No mass.     Pharynx: Posterior oropharyngeal erythema present. No pharyngeal swelling or postnasal drip.     Tonsils: No tonsillar exudate or tonsillar abscesses.  Cardiovascular:     Rate and Rhythm: Normal rate and regular rhythm.     Pulses: Normal pulses.          Radial pulses are 2+ on the right side and 2+ on the left side.       Posterior tibial pulses are 2+ on the right side and 2+ on the left side.     Heart sounds: Normal heart sounds. No murmur heard.    No gallop.  Pulmonary:     Effort: Pulmonary effort is normal. No respiratory distress.     Breath sounds: No stridor. Examination of the right-lower field reveals wheezing. Examination of the left-lower field reveals wheezing. Wheezing present. No rhonchi or rales.  Musculoskeletal:     Cervical back: Full passive range of motion without pain and neck supple.     Right lower leg: No edema.     Left  lower leg: No edema.  Lymphadenopathy:     Head:     Right side of head: No submental, submandibular, tonsillar, preauricular or posterior auricular adenopathy.     Left side of head: No submental, submandibular, tonsillar, preauricular or posterior auricular adenopathy.     Cervical:     Right cervical: No superficial cervical adenopathy.    Left cervical: No superficial cervical adenopathy.  Skin:    General: Skin is warm.     Capillary Refill: Capillary refill takes less than 2 seconds.     Coloration: Skin is pale.  Neurological:     General: No focal deficit present.     Mental Status: She is alert, oriented to person, place, and time and easily aroused. Mental status is at baseline.     GCS: GCS eye subscore is 4. GCS verbal subscore is 5. GCS motor subscore is 6.     Motor: No weakness.  Psychiatric:        Attention and Perception: Attention and perception normal.        Mood and Affect: Mood and affect normal.        Speech: Speech normal.        Behavior: Behavior normal. Behavior is cooperative.        Thought Content: Thought content normal. Thought content does not include homicidal or suicidal ideation. Thought content does not include homicidal or suicidal plan.        Cognition and Memory: Cognition and memory normal.        Judgment: Judgment normal.     Results for orders placed or performed in visit on 10/24/23  CMP14+EGFR   Collection Time: 10/24/23  9:17 AM  Result Value Ref Range   Glucose 86  70 - 99 mg/dL   BUN 32 (H) 8 - 27 mg/dL   Creatinine, Ser 1.61 (H) 0.57 - 1.00 mg/dL   eGFR 44 (L) >09 UE/AVW/0.98   BUN/Creatinine Ratio 26 12 - 28   Sodium 141 134 - 144 mmol/L   Potassium 4.9 3.5 - 5.2 mmol/L   Chloride 105 96 - 106 mmol/L   CO2 19 (L) 20 - 29 mmol/L   Calcium 9.8 8.7 - 10.3 mg/dL   Total Protein 6.8 6.0 - 8.5 g/dL   Albumin 4.4 3.7 - 4.7 g/dL   Globulin, Total 2.4 1.5 - 4.5 g/dL   Bilirubin Total 0.3 0.0 - 1.2 mg/dL   Alkaline Phosphatase  54 44 - 121 IU/L   AST 10 0 - 40 IU/L   ALT 12 0 - 32 IU/L  CBC with Differential/Platelet   Collection Time: 10/24/23  9:17 AM  Result Value Ref Range   WBC 7.9 3.4 - 10.8 x10E3/uL   RBC 4.27 3.77 - 5.28 x10E6/uL   Hemoglobin 13.2 11.1 - 15.9 g/dL   Hematocrit 11.9 14.7 - 46.6 %   MCV 95 79 - 97 fL   MCH 30.9 26.6 - 33.0 pg   MCHC 32.7 31.5 - 35.7 g/dL   RDW 82.9 56.2 - 13.0 %   Platelets 233 150 - 450 x10E3/uL   Neutrophils 66 Not Estab. %   Lymphs 22 Not Estab. %   Monocytes 10 Not Estab. %   Eos 1 Not Estab. %   Basos 1 Not Estab. %   Neutrophils Absolute 5.1 1.4 - 7.0 x10E3/uL   Lymphocytes Absolute 1.8 0.7 - 3.1 x10E3/uL   Monocytes Absolute 0.8 0.1 - 0.9 x10E3/uL   EOS (ABSOLUTE) 0.1 0.0 - 0.4 x10E3/uL   Basophils Absolute 0.1 0.0 - 0.2 x10E3/uL   Immature Granulocytes 0 Not Estab. %   Immature Grans (Abs) 0.0 0.0 - 0.1 x10E3/uL  Lipid panel   Collection Time: 10/24/23  9:17 AM  Result Value Ref Range   Cholesterol, Total 134 100 - 199 mg/dL   Triglycerides 865 (H) 0 - 149 mg/dL   HDL 38 (L) >78 mg/dL   VLDL Cholesterol Cal 45 (H) 5 - 40 mg/dL   LDL Chol Calc (NIH) 51 0 - 99 mg/dL   Chol/HDL Ratio 3.5 0.0 - 4.4 ratio       11/02/2023    8:48 AM 10/12/2023    9:20 AM 12/26/2022   10:36 AM 10/10/2022    9:14 AM 02/21/2022    8:46 AM  Depression screen PHQ 2/9  Decreased Interest 0 0 0 0 0  Down, Depressed, Hopeless 0 0 0 0 0  PHQ - 2 Score 0 0 0 0 0  Altered sleeping  0  0   Tired, decreased energy  0  0   Change in appetite  0  0   Feeling bad or failure about yourself   0  0   Trouble concentrating  0  0   Moving slowly or fidgety/restless  0  0   Suicidal thoughts  0  0   PHQ-9 Score  0  0   Difficult doing work/chores  Not difficult at all  Not difficult at all        11/02/2023    8:48 AM 10/24/2023    8:55 AM 10/12/2023    9:21 AM 10/10/2022    9:15 AM  GAD 7 : Generalized Anxiety Score  Nervous, Anxious, on Edge 0 0 0 0  Control/stop worrying 0  0  0  Worry too much - different things 0  0 0  Trouble relaxing 0  0 0  Restless 0  0 0  Easily annoyed or irritable 0  0 0  Afraid - awful might happen 0  0 0  Total GAD 7 Score 0  0 0  Anxiety Difficulty Not difficult at all  Not difficult at all Not difficult at all   Pertinent labs & imaging results that were available during my care of the patient were reviewed by me and considered in my medical decision making.  Assessment & Plan:  Erin Khan was seen today for cough and congestion.  Diagnoses and all orders for this visit:  1. Wheeze (Primary) Will start medication as below to cover for pneumonia. Prednisone for symptom management. Patient to continue to use albuterol and mucinex. Discussed side effects of medications.  - azithromycin (ZITHROMAX) 250 MG tablet; Take 2 tablets on day 1, then 1 tablet daily on days 2 through 5  Dispense: 6 tablet; Refill: 0 - predniSONE (DELTASONE) 20 MG tablet; Take 2 tablets (40 mg total) by mouth daily with breakfast for 5 days.  Dispense: 10 tablet; Refill: 0  2. Acute cough As above.  - azithromycin (ZITHROMAX) 250 MG tablet; Take 2 tablets on day 1, then 1 tablet daily on days 2 through 5  Dispense: 6 tablet; Refill: 0 - predniSONE (DELTASONE) 20 MG tablet; Take 2 tablets (40 mg total) by mouth daily with breakfast for 5 days.  Dispense: 10 tablet; Refill: 0  3. Chest congestion As above.  - azithromycin (ZITHROMAX) 250 MG tablet; Take 2 tablets on day 1, then 1 tablet daily on days 2 through 5  Dispense: 6 tablet; Refill: 0 - predniSONE (DELTASONE) 20 MG tablet; Take 2 tablets (40 mg total) by mouth daily with breakfast for 5 days.  Dispense: 10 tablet; Refill: 0  4. Primary hypertension Elevated BP in office today. Discussed with patient monitoring at home and following up with PCP.    Continue all other maintenance medications.  Follow up plan: Return if symptoms worsen or fail to improve.   Continue healthy lifestyle choices, including  diet (rich in fruits, vegetables, and lean proteins, and low in salt and simple carbohydrates) and exercise (at least 30 minutes of moderate physical activity daily).  Written and verbal instructions provided   The above assessment and management plan was discussed with the patient. The patient verbalized understanding of and has agreed to the management plan. Patient is aware to call the clinic if they develop any new symptoms or if symptoms persist or worsen. Patient is aware when to return to the clinic for a follow-up visit. Patient educated on when it is appropriate to go to the emergency department.   Neale Burly, DNP-FNP [facility] [address] [place], [place] [address] [phone]

## 2023-11-03 ENCOUNTER — Other Ambulatory Visit (HOSPITAL_COMMUNITY): Payer: Medicare Other

## 2023-11-07 ENCOUNTER — Ambulatory Visit (HOSPITAL_COMMUNITY): Admit: 2023-11-07 | Payer: Medicare Other | Admitting: Gastroenterology

## 2023-11-07 ENCOUNTER — Encounter (HOSPITAL_COMMUNITY): Payer: Self-pay

## 2023-11-07 SURGERY — COLONOSCOPY WITH PROPOFOL
Anesthesia: Choice

## 2023-11-08 DIAGNOSIS — Z960 Presence of urogenital implants: Secondary | ICD-10-CM | POA: Diagnosis not present

## 2023-11-08 DIAGNOSIS — I10 Essential (primary) hypertension: Secondary | ICD-10-CM | POA: Diagnosis not present

## 2023-11-20 ENCOUNTER — Other Ambulatory Visit: Payer: Self-pay | Admitting: Family

## 2023-11-20 DIAGNOSIS — E782 Mixed hyperlipidemia: Secondary | ICD-10-CM

## 2023-11-27 ENCOUNTER — Ambulatory Visit (INDEPENDENT_AMBULATORY_CARE_PROVIDER_SITE_OTHER)

## 2023-11-27 DIAGNOSIS — M810 Age-related osteoporosis without current pathological fracture: Secondary | ICD-10-CM | POA: Diagnosis not present

## 2023-11-27 DIAGNOSIS — Z0000 Encounter for general adult medical examination without abnormal findings: Secondary | ICD-10-CM

## 2023-11-28 DIAGNOSIS — Z780 Asymptomatic menopausal state: Secondary | ICD-10-CM | POA: Diagnosis not present

## 2023-11-28 DIAGNOSIS — M810 Age-related osteoporosis without current pathological fracture: Secondary | ICD-10-CM | POA: Diagnosis not present

## 2023-11-29 ENCOUNTER — Telehealth: Payer: Self-pay

## 2023-11-29 NOTE — Telephone Encounter (Signed)
 Noted. [facility] 04/02

## 2023-11-29 NOTE — Telephone Encounter (Signed)
 Copied from CRM [id number]. Topic: Clinical - Lab/Test Results >> Nov 29, 2023  8:30 AM Clayton Bibles wrote: Reason for CRM: I read FNP Hawks note dated 11/28/23 to Kennita. [place] had no questions and will follow FNP instructions.

## 2023-12-28 ENCOUNTER — Other Ambulatory Visit: Payer: Self-pay | Admitting: Family

## 2023-12-28 ENCOUNTER — Ambulatory Visit: Payer: Medicare Other

## 2023-12-28 VITALS — BP 149/70 | HR 75 | Ht 66.0 in | Wt 150.0 lb

## 2023-12-28 DIAGNOSIS — E781 Pure hyperglyceridemia: Secondary | ICD-10-CM

## 2023-12-28 DIAGNOSIS — Z0000 Encounter for general adult medical examination without abnormal findings: Secondary | ICD-10-CM | POA: Diagnosis not present

## 2023-12-28 DIAGNOSIS — E785 Hyperlipidemia, unspecified: Secondary | ICD-10-CM

## 2023-12-28 NOTE — Patient Instructions (Signed)
 Erin Khan , Thank you for taking time to come for your Medicare Wellness Visit. I appreciate your ongoing commitment to your health goals. Please review the following plan we discussed and let me know if I can assist you in the future.   Referrals/Orders/Follow-Ups/Clinician Recommendations: n/a  This is a list of the screening recommended for you and due dates:  Health Maintenance  Topic Date Due   COVID-19 Vaccine (5 - 2024-25 season) 01/12/2025*   Flu Shot  03/29/2024   Medicare Annual Wellness Visit  12/27/2024   DEXA scan (bone density measurement)  11/27/2025   DTaP/Tdap/Td vaccine (2 - Td or Tdap) 10/10/2032   Pneumonia Vaccine  Completed   Zoster (Shingles) Vaccine  Completed   HPV Vaccine  Aged Out   Meningitis B Vaccine  Aged Out   Hepatitis C Screening  Discontinued  *Topic was postponed. The date shown is not the original due date.    Advanced directives: (Copy Requested) Please bring a copy of your health care power of attorney and living will to the office to be added to your chart at your convenience. You can mail to [facility] [address] [place], [place] [address] or email to [email or web address]  Next Medicare Annual Wellness Visit scheduled for next year: Yes

## 2023-12-28 NOTE — Progress Notes (Signed)
 Subjective:   Erin Khan is a 83 y.o. who presents for a Medicare Wellness preventive visit.  Visit Complete: Virtual I connected with  Erin Khan on 12/28/23 by a audio enabled telemedicine application and verified that I am speaking with the correct person using two identifiers.  Patient Location: Home  Provider Location: Home Office  I discussed the limitations of evaluation and management by telemedicine. The patient expressed understanding and agreed to proceed.  Vital Signs: Because this visit was a virtual/telehealth visit, some criteria may be missing or patient reported. Any vitals not documented were not able to be obtained and vitals that have been documented are patient reported.  VideoDeclined- This patient declined Librarian, academic. Therefore the visit was completed with audio only.  Persons Participating in Visit: Patient.  AWV Questionnaire: No: Patient Medicare AWV questionnaire was not completed prior to this visit.  Cardiac Risk Factors include: advanced age (>43men, >52 women);dyslipidemia;hypertension     Objective:    Today's Vitals   12/28/23 1411  BP: (!) 149/70  Pulse: 75  Weight: 150 lb (68 kg)  Height: 5\' 6"  (1.676 m)   Body mass index is 24.21 kg/m.     12/28/2023    2:05 PM 12/26/2022   10:37 AM 12/20/2021   10:10 AM 06/22/2021    8:06 AM 12/15/2020    8:47 AM 06/22/2020    4:34 PM 05/21/2019    1:47 PM  Advanced Directives  Does Patient Have a Medical Advance Directive? No;Yes No Yes Yes Yes No No  Type of Advance Directive Living will  Healthcare Power of [employer] Power of Attorney    Does patient want to make changes to medical advance directive?     No - Patient declined    Copy of Healthcare Power of Attorney in Chart?   No - copy requested      Would patient like information on creating a medical advance directive?  No - Patient declined    No - Patient declined No - Patient declined     Current Medications (verified) Outpatient Encounter Medications as of 12/28/2023  Medication Sig   albuterol  (VENTOLIN  HFA) 108 (90 Base) MCG/ACT inhaler Inhale 2 puffs into the lungs every 6 (six) hours as needed for wheezing or shortness of breath.   alendronate  (FOSAMAX ) 70 MG tablet Take 1 tablet (70 mg total) by mouth every 7 (seven) days. Take with a full glass of water  on an empty stomach.   amLODipine  (NORVASC ) 5 MG tablet Take 1 tablet by mouth once daily   Calcium  Carbonate-Vitamin D  (CALCIUM  600+D PO) Take 1 tablet by mouth daily.   cetirizine  (ZYRTEC ) 10 MG tablet Take 1 tablet by mouth once daily   Ergocalciferol  (VITAMIN D2) 10 MCG (400 UNIT) TABS Take 400 Units by mouth daily.   ezetimibe  (ZETIA ) 10 MG tablet Take 1 tablet by mouth once daily   lisinopril  (ZESTRIL ) 40 MG tablet Take 1 tablet (40 mg total) by mouth daily.   magnesium (MAGTAB) 84 MG ( ) TBCR SR tablet Take 84 mg by mouth.   rosuvastatin  (CRESTOR ) 10 MG tablet Take 1 tablet by mouth once daily   vitamin B-12 (CYANOCOBALAMIN) 500 MCG tablet Take 500 mcg by mouth daily.    No facility-administered encounter medications on file as of 12/28/2023.    Allergies (verified) Asa [aspirin] and Statins   History: Past Medical History:  Diagnosis Date   Cataract    Colon polyps  Hypercholesteremia    Hypertension    Past Surgical History:  Procedure Laterality Date   ABDOMINAL HYSTERECTOMY     BILATERAL OOPHORECTOMY     CATARACT EXTRACTION, BILATERAL  2014   CHOLECYSTECTOMY  1980   COLONOSCOPY     COLONOSCOPY N/A 05/15/2013   Procedure: COLONOSCOPY;  Surgeon: Ruby Corporal, MD;  [facility];  Service: Endoscopy;  Laterality: N/A;  930   COLONOSCOPY N/A 10/18/2018   Procedure: COLONOSCOPY;  Surgeon: Ruby Corporal, MD;  [facility];  Service: Endoscopy;  Laterality: N/A;  830   cranial shunt     17 years ago   POLYPECTOMY  10/18/2018   Procedure: POLYPECTOMY;  Surgeon: Ruby Corporal, MD;  [facility];  Service: Endoscopy;;  colon   Family History  Problem Relation Age of Onset   Hypertension Father    Lung cancer Sister    Arthritis Sister    Heart attack Brother 75   Heart disease Brother        No details   Asthma Brother    Healthy Daughter    Healthy Son    Colon cancer Neg Hx    Social History   Socioeconomic History   Marital status: Married    Spouse name: Erin Chancellor   Number of children: 2   Years of education: 12   Highest education level: 12th grade  Occupational History   Occupation: Retired    Comment: Retail banker   Occupation: Garden Comptroller: [employer]    Comment: 20 to 25 hours a week part time  Tobacco Use   Smoking status: Never    Passive exposure: Never   Smokeless tobacco: Never  Vaping Use   Vaping status: Never Used  Substance and Sexual Activity   Alcohol use: No   Drug use: No   Sexual activity: Yes  Other Topics Concern   Not on file  Social History Narrative   She lives with her husband, Erin Chancellor.   She  has eight grandchildren.   Social Drivers of Corporate investment banker Strain: Low Risk  (12/28/2023)   Overall Financial Resource Strain (CARDIA)    Difficulty of Paying Living Expenses: Not hard at all  Food Insecurity: No Food Insecurity (12/28/2023)   Hunger Vital Sign    Worried About Running Out of Food in the Last Year: Never true    Ran Out of Food in the Last Year: Never true  Transportation Needs: No Transportation Needs (12/28/2023)   PRAPARE - Administrator, Civil Service (Medical): No    Lack of Transportation (Non-Medical): No  Physical Activity: Unknown (12/28/2023)   Exercise Vital Sign    Days of Exercise per Week: Patient declined    Minutes of Exercise per Session: Not on file  Stress: No Stress Concern Present (12/28/2023)   [employer] of Occupational Health - Occupational Stress Questionnaire    Feeling of Stress : Not at all  Social Connections:  Socially Integrated (12/28/2023)   Social Connection and Isolation Panel [NHANES]    Frequency of Communication with Friends and Family: More than three times a week    Frequency of Social Gatherings with Friends and Family: More than three times a week    Attends Religious Services: More than 4 times per year    Active Member of [employer] or Organizations: Yes    Attends Banker Meetings: More than 4 times per year  Marital Status: Married    Tobacco Counseling Counseling given: Yes    Clinical Intake:  Pre-visit preparation completed: Yes  Pain : No/denies pain     BMI - recorded: 24.21 Nutritional Status: BMI of 19-24  Normal Nutritional Risks: None Diabetes: No  No results found for: "HGBA1C"   How often do you need to have someone help you when you read instructions, pamphlets, or other written materials from your doctor or pharmacy?: 1 - Never  Interpreter Needed?: No  Information entered by :: Anola Basques T/CMA   Activities of Daily Living     12/28/2023    2:03 PM  In your present state of health, do you have any difficulty performing the following activities:  Hearing? 0  Vision? 0  Difficulty concentrating or making decisions? 0  Walking or climbing stairs? 0  Dressing or bathing? 0  Doing errands, shopping? 0  Preparing Food and eating ? N  Using the Toilet? N  In the past six months, have you accidently leaked urine? N  Do you have problems with loss of bowel control? N  Managing your Medications? N  Managing your Finances? N  Housekeeping or managing your Housekeeping? N    Patient Care Team: Yevette Hem, FNP as PCP - General (Family Medicine) Rogene Claude, MD as Consulting Physician (Obstetrics and Gynecology) Ruby Corporal, MD (Inactive) as Consulting Physician (Gastroenterology) Delilah Fend, [facility] as [facility] Care Management (Pharmacist)  Indicate any recent Medical Services you may have received from other  than [facility] providers in the past year (date may be approximate).     Assessment:   This is a routine wellness examination for [place].  Hearing/Vision screen Hearing Screening - Comments:: Pt denies hearing dif Vision Screening - Comments:: Pt denies vision dif  [facility]   Goals Addressed             This Visit's Progress    Exercise 150 min/wk Moderate Activity   On track    Patient Stated       Pt stated would like to do more walking       Depression Screen     11/02/2023    8:48 AM 10/12/2023    9:20 AM 12/26/2022   10:36 AM 10/10/2022    9:14 AM 02/21/2022    8:46 AM 12/20/2021   10:08 AM 06/15/2021    9:05 AM  PHQ 2/9 Scores  PHQ - 2 Score 0 0 0 0 0 0 0  PHQ- 9 Score  0  0   1    Fall Risk     12/28/2023    2:02 PM 11/02/2023    8:48 AM 10/12/2023    9:20 AM 12/26/2022   10:35 AM 10/10/2022    9:14 AM  Fall Risk   Falls in the past year? 0 0 0 0 0  Number falls in past yr: 0 0  0 0  Injury with Fall? 0 0  0 0  Risk for fall due to : No Fall Risks No Fall Risks  No Fall Risks No Fall Risks  Follow up Falls prevention discussed;Falls evaluation completed Falls evaluation completed  Falls prevention discussed Falls evaluation completed    MEDICARE RISK AT HOME:  Medicare Risk at Home Any stairs in or around the home?: No If so, are there any without handrails?: No Home free of loose throw rugs in walkways, pet beds, electrical cords, etc?: Yes Adequate lighting in  your home to reduce risk of falls?: Yes Life alert?: No Use of a cane, walker or w/c?: No Grab bars in the bathroom?: Yes Shower chair or bench in shower?: No Elevated toilet seat or a handicapped toilet?: No  TIMED UP AND GO:  Was the test performed?  no  Cognitive Function: 6CIT completed    01/15/2018    3:14 PM 12/08/2016    2:31 PM  MMSE - Mini Mental State Exam  Orientation to time 5 5  Orientation to Place 5 5  Registration 3 3  Attention/ Calculation 5 5  Recall 2  3  Language- name 2 objects 2 2  Language- repeat 1 1  Language- follow 3 step command 3 3  Language- read & follow direction 1 1  Write a sentence 1 1  Copy design 1 1  Total score 29 30        12/28/2023    2:11 PM 12/26/2022   10:37 AM 12/20/2021   10:14 AM 12/15/2020    8:51 AM 05/21/2019    1:41 PM  6CIT Screen  What Year? 4 points 0 points 0 points 0 points 0 points  What month? 3 points 0 points 0 points 0 points 0 points  What time? 0 points 0 points 0 points 0 points 0 points  Count back from 20 0 points 0 points 0 points 0 points 0 points  Months in reverse 0 points 0 points 0 points 0 points 0 points  Repeat phrase 0 points 0 points 0 points 2 points 0 points  Total Score 7 points 0 points 0 points 2 points 0 points    Immunizations Immunization History  Administered Date(s) Administered   Fluad Quad(high Dose 65+) 06/03/2019, 05/19/2020, 05/30/2022   Fluad Trivalent(High Dose 65+) 06/29/2023   Influenza Inj Mdck Quad Pf 06/03/2019   Influenza Split 06/08/2021   Influenza, High Dose Seasonal PF 05/30/2017, 06/12/2018   Influenza,inj,Quad PF,6+ Mos 05/28/2016   Influenza-Unspecified 06/03/2019   Moderna Sars-Covid-2 Vaccination 10/14/2019, 11/11/2019, 07/07/2020, 07/19/2022   Pneumococcal Conjugate-13 05/29/2014, 07/25/2016   Pneumococcal Polysaccharide-23 03/22/2016   RSV,unspecified 05/30/2022   Tdap 10/10/2022   Zoster Recombinant(Shingrix ) 04/22/2022, 04/10/2023    Screening Tests Health Maintenance  Topic Date Due   COVID-19 Vaccine (5 - 2024-25 season) 01/12/2025 (Originally 04/30/2023)   INFLUENZA VACCINE  03/29/2024   Medicare Annual Wellness (AWV)  12/27/2024   DEXA SCAN  11/27/2025   DTaP/Tdap/Td (2 - Td or Tdap) 10/10/2032   Pneumonia Vaccine 51+ Years old  Completed   Zoster Vaccines- Shingrix   Completed   HPV VACCINES  Aged Out   Meningococcal B Vaccine  Aged Out   Hepatitis C Screening  Discontinued    Health Maintenance  There are no  preventive care reminders to display for this patient.  Health Maintenance Items Addressed: See Nurse Notes  Additional Screening:  Vision Screening: Recommended annual ophthalmology exams for early detection of glaucoma and other disorders of the eye.  Dental Screening: Recommended annual dental exams for proper oral hygiene  Community Resource Referral / Chronic Care Management: CRR required this visit?  yes  CCM required this visit?  No     Plan:     I have personally reviewed and noted the following in the patient's chart:   Medical and social history Use of alcohol, tobacco or illicit drugs  Current medications and supplements including opioid prescriptions. Patient is not currently taking opioid prescriptions. Functional ability and status Nutritional status Physical activity Advanced directives  List of other physicians Hospitalizations, surgeries, and ER visits in previous 12 months Vitals Screenings to include cognitive, depression, and falls Referrals and appointments  In addition, I have reviewed and discussed with patient certain preventive protocols, quality metrics, and best practice recommendations. A written personalized care plan for preventive services as well as general preventive health recommendations were provided to patient.     Michaelle Adolphus, CMA   12/28/2023   After Visit Summary: (Declined) Due to this being a telephonic visit, with patients personalized plan was offered to patient but patient Declined AVS at this time   Notes: Nothing significant to report at this time.

## 2023-12-30 ENCOUNTER — Other Ambulatory Visit: Payer: Self-pay | Admitting: Family

## 2023-12-30 DIAGNOSIS — E781 Pure hyperglyceridemia: Secondary | ICD-10-CM

## 2023-12-30 DIAGNOSIS — E785 Hyperlipidemia, unspecified: Secondary | ICD-10-CM

## 2023-12-30 DIAGNOSIS — J209 Acute bronchitis, unspecified: Secondary | ICD-10-CM

## 2024-01-04 ENCOUNTER — Telehealth: Payer: Self-pay

## 2024-01-04 NOTE — Progress Notes (Signed)
 Complex Care Management Note Care Guide Note  01/04/2024 Name: Erin Khan MRN: [id number] DOB: 01-15-1941   Complex Care Management Outreach Attempts: An unsuccessful telephone outreach was attempted today to offer the patient information about available complex care management services.  Follow Up Plan:  Additional outreach attempts will be made to offer the patient complex care management information and services.   Encounter Outcome:  No Answer  Lenton Rail , RMA     [place]  [facility], [facility] Guide  Direct Dial: [phone]  Website: [email or web address]

## 2024-01-05 ENCOUNTER — Ambulatory Visit (INDEPENDENT_AMBULATORY_CARE_PROVIDER_SITE_OTHER)

## 2024-01-05 DIAGNOSIS — Z23 Encounter for immunization: Secondary | ICD-10-CM | POA: Diagnosis not present

## 2024-01-05 NOTE — Progress Notes (Signed)
 Patient is in office today for a nurse visit for Prevnar 20 Immunization. Patient Injection was given in the  Right deltoid. Patient tolerated injection well.

## 2024-01-08 ENCOUNTER — Other Ambulatory Visit: Payer: Self-pay | Admitting: Cardiology

## 2024-01-17 NOTE — Progress Notes (Signed)
 Complex Care Management Note  Care Guide Note 01/17/2024 Name: Erin Khan MRN: [id number] DOB: 01-Jul-1941  Erin Khan is a 83 y.o. year old female who sees [place], Libbie Redwood, FNP for primary care. I reached out to Erin Khan by phone today to offer complex care management services.  Erin Khan was given information about Complex Care Management services today including:   The Complex Care Management services include support from the care team which includes your Nurse Care Manager, Clinical Social Worker, or Pharmacist.  The Complex Care Management team is here to help remove barriers to the health concerns and goals most important to you. Complex Care Management services are voluntary, and the patient may decline or stop services at any time by request to their care team member.   Complex Care Management Consent Status: Patient agreed to services and verbal consent obtained.   Follow up plan:  Telephone appointment with complex care management team member scheduled for:  01/19/2024  Encounter Outcome:  Patient Scheduled  Lenton Rail , RMA     [place]  [facility], [facility] Guide  Direct Dial: [phone]  Website: [email or web address]

## 2024-01-19 ENCOUNTER — Other Ambulatory Visit: Payer: Self-pay

## 2024-01-19 NOTE — Patient Outreach (Signed)
 Complex Care Management   Visit Note  01/19/2024  Name:  Erin Khan MRN: [id number] DOB: Jul 08, 1941  Situation: Referral received for Complex Care Management related to Senior activities and exercise I obtained verbal consent from Patient.  Visit completed with patient  on the phone  Background:   Past Medical History:  Diagnosis Date   Cataract    Colon polyps    Hypercholesteremia    Hypertension     Assessment:  Patient reports she wants to attend activities and exercise classes in the community.  SW t/c [facility] and provided a list of options for free membership. Patient selects [facility]. Patient is provided the code [id number] to provide for approval to [facility]. SW provides other Rec Center options to also contact.   SDOH Interventions    Flowsheet Row Patient Outreach Telephone from 01/19/2024 in [place] POPULATION HEALTH DEPARTMENT Clinical Support from 12/28/2023 in [facility] Clinical Support from 12/26/2022 in [facility] Clinical Support from 12/20/2021 in [facility]  SDOH Interventions      Food Insecurity Interventions Intervention Not Indicated Intervention Not Indicated Intervention Not Indicated Intervention Not Indicated  Housing Interventions Intervention Not Indicated Intervention Not Indicated Intervention Not Indicated Intervention Not Indicated  Transportation Interventions Intervention Not Indicated Intervention Not Indicated Intervention Not Indicated Intervention Not Indicated  Utilities Interventions Intervention Not Indicated Intervention Not Indicated -- --  Alcohol Usage Interventions -- Intervention Not Indicated (Score <7) Intervention Not Indicated (Score <7) --  Financial Strain Interventions Intervention Not Indicated Intervention Not Indicated Intervention Not Indicated Intervention Not Indicated  Physical Activity Interventions  -- Other (Comments)  [[id number]] Intervention Not Indicated Intervention Not Indicated  Stress Interventions -- Intervention Not Indicated Intervention Not Indicated Intervention Not Indicated  Social Connections Interventions -- Intervention Not Indicated Intervention Not Indicated Intervention Not Indicated  Health Literacy Interventions -- Intervention Not Indicated -- --         Recommendation:   No recommendations  Follow Up Plan:   Telephone follow up appointment date/time:  01/30/24 at 1pm.  Dallis Dues, BSW [place]  [facility], [place] [facility] Social Worker Direct Dial: [phone]  Fax: [phone] Website: [email or web address]

## 2024-01-19 NOTE — Patient Instructions (Signed)
 Visit Information  Thank you for taking time to visit with me today. Please don't hesitate to contact me if I can be of assistance to you before our next scheduled appointment.  Our next appointment is by telephone on 01/30/24 at 1pm Please call the care guide team at [phone] if you need to cancel or reschedule your appointment.   Following is a copy of your care plan:   Goals Addressed             This Visit's Progress    BSW-VBCI Social Work Care Plan       Problems:   Patient would like to enroll in exercise and Senior acitivites.  CSW Clinical Goal(s):   Over the next 14 days the Patient will will follow up with community recreation center as directed by Social Work.  Interventions:  SW t/c [employer] and provided a list of options for free membership.  Patient selects [facility].  Patient is provided the code [id number] to provide for approval to Rec Center.  SW provides other Rec Center options to also contact.  Patient Goals/Self-Care Activities:  Patient agreed to contact [facility] to enroll.  Plan:   Telephone follow up appointment with care management team member scheduled for:  01/30/24 1pm         Please call 911 if you are experiencing a Mental Health or Behavioral Health Crisis or need someone to talk to.  Patient verbalizes understanding of instructions and care plan provided today and agrees to view in MyChart. Active MyChart status and patient understanding of how to access instructions and care plan via MyChart confirmed with patient.     Dallis Dues, BSW [place]  [facility], [facility] Social Worker Direct Dial: [phone]  Fax: [phone] Website: [email or web address]

## 2024-01-30 ENCOUNTER — Other Ambulatory Visit: Payer: Self-pay | Admitting: Cardiology

## 2024-01-30 ENCOUNTER — Other Ambulatory Visit: Payer: Self-pay

## 2024-01-30 NOTE — Patient Outreach (Signed)
 Complex Care Management   Visit Note  01/30/2024  Name:  Erin Khan MRN: [id number] DOB: 1941-04-25  Situation: Referral received for Complex Care Management related to Exercise Activities I obtained verbal consent from Patient.  Visit completed with patient  on the phone  Background:   Past Medical History:  Diagnosis Date   Cataract    Colon polyps    Hypercholesteremia    Hypertension     Assessment: Patient reports she call the Rec Center but did not get an answer and has not called back  Patient agrees to call next week and start walking in her driveway.  Patient does not request a follow up.  SDOH Interventions    Flowsheet Row Patient Outreach Telephone from 01/19/2024 in [place] POPULATION HEALTH DEPARTMENT Clinical Support from 12/28/2023 in [facility] Clinical Support from 12/26/2022 in [facility] Clinical Support from 12/20/2021 in [facility]  SDOH Interventions      Food Insecurity Interventions Intervention Not Indicated Intervention Not Indicated Intervention Not Indicated Intervention Not Indicated  Housing Interventions Intervention Not Indicated Intervention Not Indicated Intervention Not Indicated Intervention Not Indicated  Transportation Interventions Intervention Not Indicated Intervention Not Indicated Intervention Not Indicated Intervention Not Indicated  Utilities Interventions Intervention Not Indicated Intervention Not Indicated -- --  Alcohol Usage Interventions -- Intervention Not Indicated (Score <7) Intervention Not Indicated (Score <7) --  Financial Strain Interventions Intervention Not Indicated Intervention Not Indicated Intervention Not Indicated Intervention Not Indicated  Physical Activity Interventions -- Other (Comments)  [[id number]] Intervention Not Indicated Intervention Not Indicated  Stress Interventions -- Intervention Not Indicated  Intervention Not Indicated Intervention Not Indicated  Social Connections Interventions -- Intervention Not Indicated Intervention Not Indicated Intervention Not Indicated  Health Literacy Interventions -- Intervention Not Indicated -- --         Recommendation:   none  Follow Up Plan:   Patient has met all care management goals. Care Management case will be closed. Patient has been provided contact information should new needs arise.   SIG Dallis Dues, BSW [place]  [facility], [facility] Social Worker Direct Dial: [phone]  Fax: [phone] Website: [email or web address]

## 2024-01-30 NOTE — Patient Instructions (Signed)

## 2024-01-31 DIAGNOSIS — M9902 Segmental and somatic dysfunction of thoracic region: Secondary | ICD-10-CM | POA: Diagnosis not present

## 2024-01-31 DIAGNOSIS — M9903 Segmental and somatic dysfunction of lumbar region: Secondary | ICD-10-CM | POA: Diagnosis not present

## 2024-01-31 DIAGNOSIS — M9901 Segmental and somatic dysfunction of cervical region: Secondary | ICD-10-CM | POA: Diagnosis not present

## 2024-01-31 DIAGNOSIS — M62830 Muscle spasm of back: Secondary | ICD-10-CM | POA: Diagnosis not present

## 2024-02-01 DIAGNOSIS — M9903 Segmental and somatic dysfunction of lumbar region: Secondary | ICD-10-CM | POA: Diagnosis not present

## 2024-02-01 DIAGNOSIS — M62830 Muscle spasm of back: Secondary | ICD-10-CM | POA: Diagnosis not present

## 2024-02-01 DIAGNOSIS — M9901 Segmental and somatic dysfunction of cervical region: Secondary | ICD-10-CM | POA: Diagnosis not present

## 2024-02-01 DIAGNOSIS — M9902 Segmental and somatic dysfunction of thoracic region: Secondary | ICD-10-CM | POA: Diagnosis not present

## 2024-02-05 DIAGNOSIS — M9901 Segmental and somatic dysfunction of cervical region: Secondary | ICD-10-CM | POA: Diagnosis not present

## 2024-02-05 DIAGNOSIS — M9902 Segmental and somatic dysfunction of thoracic region: Secondary | ICD-10-CM | POA: Diagnosis not present

## 2024-02-05 DIAGNOSIS — M62830 Muscle spasm of back: Secondary | ICD-10-CM | POA: Diagnosis not present

## 2024-02-05 DIAGNOSIS — M9903 Segmental and somatic dysfunction of lumbar region: Secondary | ICD-10-CM | POA: Diagnosis not present

## 2024-02-07 DIAGNOSIS — M9903 Segmental and somatic dysfunction of lumbar region: Secondary | ICD-10-CM | POA: Diagnosis not present

## 2024-02-07 DIAGNOSIS — M62830 Muscle spasm of back: Secondary | ICD-10-CM | POA: Diagnosis not present

## 2024-02-07 DIAGNOSIS — M9902 Segmental and somatic dysfunction of thoracic region: Secondary | ICD-10-CM | POA: Diagnosis not present

## 2024-02-07 DIAGNOSIS — M9901 Segmental and somatic dysfunction of cervical region: Secondary | ICD-10-CM | POA: Diagnosis not present

## 2024-02-08 DIAGNOSIS — M9901 Segmental and somatic dysfunction of cervical region: Secondary | ICD-10-CM | POA: Diagnosis not present

## 2024-02-08 DIAGNOSIS — M9903 Segmental and somatic dysfunction of lumbar region: Secondary | ICD-10-CM | POA: Diagnosis not present

## 2024-02-08 DIAGNOSIS — M62830 Muscle spasm of back: Secondary | ICD-10-CM | POA: Diagnosis not present

## 2024-02-08 DIAGNOSIS — M9902 Segmental and somatic dysfunction of thoracic region: Secondary | ICD-10-CM | POA: Diagnosis not present

## 2024-02-12 DIAGNOSIS — M9902 Segmental and somatic dysfunction of thoracic region: Secondary | ICD-10-CM | POA: Diagnosis not present

## 2024-02-12 DIAGNOSIS — M62830 Muscle spasm of back: Secondary | ICD-10-CM | POA: Diagnosis not present

## 2024-02-12 DIAGNOSIS — M9903 Segmental and somatic dysfunction of lumbar region: Secondary | ICD-10-CM | POA: Diagnosis not present

## 2024-02-12 DIAGNOSIS — M9901 Segmental and somatic dysfunction of cervical region: Secondary | ICD-10-CM | POA: Diagnosis not present

## 2024-02-13 DIAGNOSIS — I10 Essential (primary) hypertension: Secondary | ICD-10-CM | POA: Diagnosis not present

## 2024-02-13 DIAGNOSIS — Z960 Presence of urogenital implants: Secondary | ICD-10-CM | POA: Diagnosis not present

## 2024-02-15 DIAGNOSIS — M9903 Segmental and somatic dysfunction of lumbar region: Secondary | ICD-10-CM | POA: Diagnosis not present

## 2024-02-15 DIAGNOSIS — M9902 Segmental and somatic dysfunction of thoracic region: Secondary | ICD-10-CM | POA: Diagnosis not present

## 2024-02-15 DIAGNOSIS — M62830 Muscle spasm of back: Secondary | ICD-10-CM | POA: Diagnosis not present

## 2024-02-15 DIAGNOSIS — M9901 Segmental and somatic dysfunction of cervical region: Secondary | ICD-10-CM | POA: Diagnosis not present

## 2024-02-19 DIAGNOSIS — M62830 Muscle spasm of back: Secondary | ICD-10-CM | POA: Diagnosis not present

## 2024-02-19 DIAGNOSIS — M9901 Segmental and somatic dysfunction of cervical region: Secondary | ICD-10-CM | POA: Diagnosis not present

## 2024-02-19 DIAGNOSIS — M9902 Segmental and somatic dysfunction of thoracic region: Secondary | ICD-10-CM | POA: Diagnosis not present

## 2024-02-19 DIAGNOSIS — M9903 Segmental and somatic dysfunction of lumbar region: Secondary | ICD-10-CM | POA: Diagnosis not present

## 2024-02-22 DIAGNOSIS — M9901 Segmental and somatic dysfunction of cervical region: Secondary | ICD-10-CM | POA: Diagnosis not present

## 2024-02-22 DIAGNOSIS — M9902 Segmental and somatic dysfunction of thoracic region: Secondary | ICD-10-CM | POA: Diagnosis not present

## 2024-02-22 DIAGNOSIS — M62830 Muscle spasm of back: Secondary | ICD-10-CM | POA: Diagnosis not present

## 2024-02-22 DIAGNOSIS — M9903 Segmental and somatic dysfunction of lumbar region: Secondary | ICD-10-CM | POA: Diagnosis not present

## 2024-02-26 DIAGNOSIS — M62830 Muscle spasm of back: Secondary | ICD-10-CM | POA: Diagnosis not present

## 2024-02-26 DIAGNOSIS — M9901 Segmental and somatic dysfunction of cervical region: Secondary | ICD-10-CM | POA: Diagnosis not present

## 2024-02-26 DIAGNOSIS — M9902 Segmental and somatic dysfunction of thoracic region: Secondary | ICD-10-CM | POA: Diagnosis not present

## 2024-02-26 DIAGNOSIS — M9903 Segmental and somatic dysfunction of lumbar region: Secondary | ICD-10-CM | POA: Diagnosis not present

## 2024-02-27 ENCOUNTER — Other Ambulatory Visit: Payer: Self-pay | Admitting: Cardiology

## 2024-02-29 DIAGNOSIS — M9902 Segmental and somatic dysfunction of thoracic region: Secondary | ICD-10-CM | POA: Diagnosis not present

## 2024-02-29 DIAGNOSIS — M62830 Muscle spasm of back: Secondary | ICD-10-CM | POA: Diagnosis not present

## 2024-02-29 DIAGNOSIS — M9903 Segmental and somatic dysfunction of lumbar region: Secondary | ICD-10-CM | POA: Diagnosis not present

## 2024-02-29 DIAGNOSIS — M9901 Segmental and somatic dysfunction of cervical region: Secondary | ICD-10-CM | POA: Diagnosis not present

## 2024-03-04 DIAGNOSIS — M62830 Muscle spasm of back: Secondary | ICD-10-CM | POA: Diagnosis not present

## 2024-03-04 DIAGNOSIS — M9902 Segmental and somatic dysfunction of thoracic region: Secondary | ICD-10-CM | POA: Diagnosis not present

## 2024-03-04 DIAGNOSIS — M9903 Segmental and somatic dysfunction of lumbar region: Secondary | ICD-10-CM | POA: Diagnosis not present

## 2024-03-04 DIAGNOSIS — M9901 Segmental and somatic dysfunction of cervical region: Secondary | ICD-10-CM | POA: Diagnosis not present

## 2024-03-11 DIAGNOSIS — M9903 Segmental and somatic dysfunction of lumbar region: Secondary | ICD-10-CM | POA: Diagnosis not present

## 2024-03-11 DIAGNOSIS — M9902 Segmental and somatic dysfunction of thoracic region: Secondary | ICD-10-CM | POA: Diagnosis not present

## 2024-03-11 DIAGNOSIS — M62830 Muscle spasm of back: Secondary | ICD-10-CM | POA: Diagnosis not present

## 2024-03-11 DIAGNOSIS — M9901 Segmental and somatic dysfunction of cervical region: Secondary | ICD-10-CM | POA: Diagnosis not present

## 2024-03-12 ENCOUNTER — Encounter: Payer: Self-pay | Admitting: Family

## 2024-03-12 ENCOUNTER — Ambulatory Visit: Admitting: Family

## 2024-03-12 DIAGNOSIS — E782 Mixed hyperlipidemia: Secondary | ICD-10-CM | POA: Diagnosis not present

## 2024-03-12 DIAGNOSIS — M810 Age-related osteoporosis without current pathological fracture: Secondary | ICD-10-CM

## 2024-03-12 DIAGNOSIS — E781 Pure hyperglyceridemia: Secondary | ICD-10-CM | POA: Diagnosis not present

## 2024-03-12 DIAGNOSIS — E785 Hyperlipidemia, unspecified: Secondary | ICD-10-CM

## 2024-03-12 DIAGNOSIS — I10 Essential (primary) hypertension: Secondary | ICD-10-CM | POA: Diagnosis not present

## 2024-03-12 MED ORDER — EZETIMIBE 10 MG PO TABS: 10.0000 mg | ORAL_TABLET | Freq: Every day | ORAL | 2 refills | Status: AC

## 2024-03-12 MED ORDER — AMLODIPINE BESYLATE 5 MG PO TABS: 5.0000 mg | ORAL_TABLET | Freq: Every day | ORAL | 1 refills | Status: DC

## 2024-03-12 MED ORDER — CETIRIZINE HCL 10 MG PO TABS: 10.0000 mg | ORAL_TABLET | Freq: Every day | ORAL | 2 refills | Status: AC

## 2024-03-12 MED ORDER — ROSUVASTATIN CALCIUM 10 MG PO TABS: 10.0000 mg | ORAL_TABLET | Freq: Every day | ORAL | 2 refills | Status: AC

## 2024-03-12 MED ORDER — LISINOPRIL 40 MG PO TABS: 40.0000 mg | ORAL_TABLET | Freq: Every day | ORAL | 2 refills | Status: AC

## 2024-03-12 NOTE — Patient Instructions (Signed)
 Hypertension, Adult High blood pressure (hypertension) is when the force of blood pumping through the arteries is too strong. The arteries are the blood vessels that carry blood from the heart throughout the body. Hypertension forces the heart to work harder to pump blood and may cause arteries to become narrow or stiff. Untreated or uncontrolled hypertension can lead to a heart attack, heart failure, a stroke, kidney disease, and other problems. A blood pressure reading consists of a higher number over a lower number. Ideally, your blood pressure should be below 120/80. The first ("top") number is called the systolic pressure. It is a measure of the pressure in your arteries as your heart beats. The second ("bottom") number is called the diastolic pressure. It is a measure of the pressure in your arteries as the heart relaxes. What are the causes? The exact cause of this condition is not known. There are some conditions that result in high blood pressure. What increases the risk? Certain factors may make you more likely to develop high blood pressure. Some of these risk factors are under your control, including: Smoking. Not getting enough exercise or physical activity. Being overweight. Having too much fat, sugar, calories, or salt (sodium) in your diet. Drinking too much alcohol. Other risk factors include: Having a personal history of heart disease, diabetes, high cholesterol, or kidney disease. Stress. Having a family history of high blood pressure and high cholesterol. Having obstructive sleep apnea. Age. The risk increases with age. What are the signs or symptoms? High blood pressure may not cause symptoms. Very high blood pressure (hypertensive crisis) may cause: Headache. Fast or irregular heartbeats (palpitations). Shortness of breath. Nosebleed. Nausea and vomiting. Vision changes. Severe chest pain, dizziness, and seizures. How is this diagnosed? This condition is diagnosed by  measuring your blood pressure while you are seated, with your arm resting on a flat surface, your legs uncrossed, and your feet flat on the floor. The cuff of the blood pressure monitor will be placed directly against the skin of your upper arm at the level of your heart. Blood pressure should be measured at least twice using the same arm. Certain conditions can cause a difference in blood pressure between your right and left arms. If you have a high blood pressure reading during one visit or you have normal blood pressure with other risk factors, you may be asked to: Return on a different day to have your blood pressure checked again. Monitor your blood pressure at home for 1 week or longer. If you are diagnosed with hypertension, you may have other blood or imaging tests to help your health care provider understand your overall risk for other conditions. How is this treated? This condition is treated by making healthy lifestyle changes, such as eating healthy foods, exercising more, and reducing your alcohol intake. You may be referred for counseling on a healthy diet and physical activity. Your health care provider may prescribe medicine if lifestyle changes are not enough to get your blood pressure under control and if: Your systolic blood pressure is above 130. Your diastolic blood pressure is above 80. Your personal target blood pressure may vary depending on your medical conditions, your age, and other factors. Follow these instructions at home: Eating and drinking  Eat a diet that is high in fiber and potassium, and low in sodium, added sugar, and fat. An example of this eating plan is called the DASH diet. DASH stands for Dietary Approaches to Stop Hypertension. To eat this way: Eat  plenty of fresh fruits and vegetables. Try to fill one half of your plate at each meal with fruits and vegetables. Eat whole grains, such as whole-wheat pasta, brown rice, or whole-grain bread. Fill about one  fourth of your plate with whole grains. Eat or drink low-fat dairy products, such as skim milk or low-fat yogurt. Avoid fatty cuts of meat, processed or cured meats, and poultry with skin. Fill about one fourth of your plate with lean proteins, such as fish, chicken without skin, beans, eggs, or tofu. Avoid pre-made and processed foods. These tend to be higher in sodium, added sugar, and fat. Reduce your daily sodium intake. Many people with hypertension should eat less than 1,500 mg of sodium a day. Do not drink alcohol if: Your health care provider tells you not to drink. You are pregnant, may be pregnant, or are planning to become pregnant. If you drink alcohol: Limit how much you have to: 0-1 drink a day for women. 0-2 drinks a day for men. Know how much alcohol is in your drink. In the [place] one drink equals one 12 oz bottle of beer (355 mL), one 5 oz glass of wine (148 mL), or one 1 oz glass of hard liquor (44 mL). Lifestyle  Work with your health care provider to maintain a healthy body weight or to lose weight. Ask what an ideal weight is for you. Get at least 30 minutes of exercise that causes your heart to beat faster (aerobic exercise) most days of the week. Activities may include walking, swimming, or biking. Include exercise to strengthen your muscles (resistance exercise), such as Pilates or lifting weights, as part of your weekly exercise routine. Try to do these types of exercises for 30 minutes at least 3 days a week. Do not use any products that contain nicotine or tobacco. These products include cigarettes, chewing tobacco, and vaping devices, such as e-cigarettes. If you need help quitting, ask your health care provider. Monitor your blood pressure at home as told by your health care provider. Keep all follow-up visits. This is important. Medicines Take over-the-counter and prescription medicines only as told by your health care provider. Follow directions carefully. Blood  pressure medicines must be taken as prescribed. Do not skip doses of blood pressure medicine. Doing this puts you at risk for problems and can make the medicine less effective. Ask your health care provider about side effects or reactions to medicines that you should watch for. Contact a health care provider if you: Think you are having a reaction to a medicine you are taking. Have headaches that keep coming back (recurring). Feel dizzy. Have swelling in your ankles. Have trouble with your vision. Get help right away if you: Develop a severe headache or confusion. Have unusual weakness or numbness. Feel faint. Have severe pain in your chest or abdomen. Vomit repeatedly. Have trouble breathing. These symptoms may be an emergency. Get help right away. Call 911. Do not wait to see if the symptoms will go away. Do not drive yourself to the hospital. Summary Hypertension is when the force of blood pumping through your arteries is too strong. If this condition is not controlled, it may put you at risk for serious complications. Your personal target blood pressure may vary depending on your medical conditions, your age, and other factors. For most people, a normal blood pressure is less than 120/80. Hypertension is treated with lifestyle changes, medicines, or a combination of both. Lifestyle changes include losing weight, eating a healthy,  low-sodium diet, exercising more, and limiting alcohol. This information is not intended to replace advice given to you by your health care provider. Make sure you discuss any questions you have with your health care provider. Document Revised: 06/22/2021 Document Reviewed: 06/22/2021 Elsevier Patient Education  2024 [employer].

## 2024-03-12 NOTE — Progress Notes (Signed)
 Subjective:    Patient ID: Erin Khan, female    DOB: January 29, 1941, 83 y.o.   MRN: [id number]  Chief Complaint  Patient presents with   Medical Management of Chronic Issues    PT presents to the office today for chronic follow up.   She is followed by GYN every 6 months for pessary maintenance.    She has CKD and avoids NSAID's.   She has osteoporosis and takes Fosamax  weekly. Her last Dexa scan was 06/15/21. Hypertension This is a chronic problem. The current episode started more than 1 year ago. The problem has been waxing and waning since onset. The problem is uncontrolled. Pertinent negatives include no malaise/fatigue, peripheral edema or shortness of breath. Risk factors for coronary artery disease include dyslipidemia and sedentary lifestyle. The current treatment provides moderate improvement.  Hyperlipidemia This is a chronic problem. The current episode started more than 1 year ago. The problem is controlled. Recent lipid tests were reviewed and are normal. Pertinent negatives include no shortness of breath. Current antihyperlipidemic treatment includes ezetimibe  and statins. The current treatment provides moderate improvement of lipids. Risk factors for coronary artery disease include dyslipidemia, hypertension, a sedentary lifestyle and post-menopausal.      Review of Systems  Constitutional:  Negative for malaise/fatigue.  Respiratory:  Negative for shortness of breath.   All other systems reviewed and are negative.  Family History  Problem Relation Age of Onset   Hypertension Father    Lung cancer Sister    Arthritis Sister    Heart attack Brother 85   Heart disease Brother        No details   Asthma Brother    Healthy Daughter    Healthy Son    Colon cancer Neg Hx    Social History   Socioeconomic History   Marital status: Married    Spouse name: Erin Khan   Number of children: 2   Years of education: 12   Highest education level: 12th grade   Occupational History   Occupation: Retired    Comment: Retail banker   Occupation: Garden Comptroller: [employer]    Comment: 20 to 25 hours a week part time  Tobacco Use   Smoking status: Never    Passive exposure: Never   Smokeless tobacco: Never  Vaping Use   Vaping status: Never Used  Substance and Sexual Activity   Alcohol use: No   Drug use: No   Sexual activity: Yes  Other Topics Concern   Not on file  Social History Narrative   She lives with her husband, Erin Khan.   She  has eight grandchildren.   Social Drivers of Corporate investment banker Strain: Low Risk  (01/19/2024)   Overall Financial Resource Strain (CARDIA)    Difficulty of Paying Living Expenses: Not hard at all  Food Insecurity: No Food Insecurity (01/19/2024)   Hunger Vital Sign    Worried About Running Out of Food in the Last Year: Never true    Ran Out of Food in the Last Year: Never true  Transportation Needs: No Transportation Needs (01/19/2024)   PRAPARE - Administrator, Civil Service (Medical): No    Lack of Transportation (Non-Medical): No  Physical Activity: Unknown (12/28/2023)   Exercise Vital Sign    Days of Exercise per Week: Patient declined    Minutes of Exercise per Session: Not on file  Stress: No Stress Concern Present (12/28/2023)   [employer] of Occupational Health -  Occupational Stress Questionnaire    Feeling of Stress : Not at all  Social Connections: Socially Integrated (12/28/2023)   Social Connection and Isolation Panel    Frequency of Communication with Friends and Family: More than three times a week    Frequency of Social Gatherings with Friends and Family: More than three times a week    Attends Religious Services: More than 4 times per year    Active Member of [employer] or Organizations: Yes    Attends Engineer, structural: More than 4 times per year    Marital Status: Married       Objective:   Physical Exam Vitals reviewed.  Constitutional:       General: She is not in acute distress.    Appearance: She is well-developed.  HENT:     Head: Normocephalic and atraumatic.     Right Ear: Tympanic membrane normal.     Left Ear: Tympanic membrane normal.  Eyes:     Pupils: Pupils are equal, round, and reactive to light.  Neck:     Thyroid : No thyromegaly.  Cardiovascular:     Rate and Rhythm: Normal rate and regular rhythm.     Heart sounds: Normal heart sounds. No murmur heard. Pulmonary:     Effort: Pulmonary effort is normal. No respiratory distress.     Breath sounds: Normal breath sounds. No wheezing.  Abdominal:     General: Bowel sounds are normal. There is no distension.     Palpations: Abdomen is soft.     Tenderness: There is no abdominal tenderness.  Musculoskeletal:        General: No tenderness. Normal range of motion.     Cervical back: Normal range of motion and neck supple.  Skin:    General: Skin is warm and dry.  Neurological:     Mental Status: She is alert and oriented to person, place, and time.     Cranial Nerves: No cranial nerve deficit.     Deep Tendon Reflexes: Reflexes are normal and symmetric.  Psychiatric:        Behavior: Behavior normal.        Thought Content: Thought content normal.        Judgment: Judgment normal.       BP 128/78      Assessment & Plan:  Erin Khan comes in today with chief complaint of Medical Management of Chronic Issues   Diagnosis and orders addressed:  1. Age-related osteoporosis without current pathological fracture - CMP14+EGFR  2. Mixed hyperlipidemia - ezetimibe  (ZETIA ) 10 MG tablet; Take 1 tablet (10 mg total) by mouth daily.  Dispense: 90 tablet; Refill: 2 - CMP14+EGFR  3. Hyperlipidemia, unspecified hyperlipidemia type - rosuvastatin  (CRESTOR ) 10 MG tablet; Take 1 tablet (10 mg total) by mouth daily.  Dispense: 90 tablet; Refill: 2 - CMP14+EGFR  4. Hypertriglyceridemia - rosuvastatin  (CRESTOR ) 10 MG tablet; Take 1 tablet (10 mg total) by  mouth daily.  Dispense: 90 tablet; Refill: 2 - CMP14+EGFR  5. Primary hypertension - lisinopril  (ZESTRIL ) 40 MG tablet; Take 1 tablet (40 mg total) by mouth daily.  Dispense: 90 tablet; Refill: 2 - amLODipine  (NORVASC ) 5 MG tablet; Take 1 tablet (5 mg total) by mouth daily.  Dispense: 90 tablet; Refill: 1 - CMP14+EGFR   Labs pending Continue current medications  Health Maintenance reviewed Diet and exercise encouraged  Follow up plan: 4 months   Bari Learn, FNP

## 2024-03-13 LAB — CMP14+EGFR
ALT: 15 IU/L (ref 0–32)
AST: 12 IU/L (ref 0–40)
Albumin: 4.1 g/dL (ref 3.7–4.7)
Alkaline Phosphatase: 56 IU/L (ref 44–121)
BUN/Creatinine Ratio: 27 (ref 12–28)
BUN: 40 mg/dL — ABNORMAL HIGH (ref 8–27)
Bilirubin Total: 0.3 mg/dL (ref 0.0–1.2)
CO2: 19 mmol/L — ABNORMAL LOW (ref 20–29)
Calcium: 9.2 mg/dL (ref 8.7–10.3)
Chloride: 106 mmol/L (ref 96–106)
Creatinine, Ser: 1.49 mg/dL — ABNORMAL HIGH (ref 0.57–1.00)
Globulin, Total: 2.8 g/dL (ref 1.5–4.5)
Glucose: 94 mg/dL (ref 70–99)
Potassium: 4.7 mmol/L (ref 3.5–5.2)
Sodium: 141 mmol/L (ref 134–144)
Total Protein: 6.9 g/dL (ref 6.0–8.5)
eGFR: 35 mL/min/1.73 — ABNORMAL LOW (ref >59)

## 2024-03-14 ENCOUNTER — Ambulatory Visit: Payer: Self-pay | Admitting: Family

## 2024-03-18 DIAGNOSIS — M62830 Muscle spasm of back: Secondary | ICD-10-CM | POA: Diagnosis not present

## 2024-03-18 DIAGNOSIS — M9902 Segmental and somatic dysfunction of thoracic region: Secondary | ICD-10-CM | POA: Diagnosis not present

## 2024-03-18 DIAGNOSIS — M9901 Segmental and somatic dysfunction of cervical region: Secondary | ICD-10-CM | POA: Diagnosis not present

## 2024-03-18 DIAGNOSIS — M9903 Segmental and somatic dysfunction of lumbar region: Secondary | ICD-10-CM | POA: Diagnosis not present

## 2024-03-25 DIAGNOSIS — M9902 Segmental and somatic dysfunction of thoracic region: Secondary | ICD-10-CM | POA: Diagnosis not present

## 2024-03-25 DIAGNOSIS — M62830 Muscle spasm of back: Secondary | ICD-10-CM | POA: Diagnosis not present

## 2024-03-25 DIAGNOSIS — M9903 Segmental and somatic dysfunction of lumbar region: Secondary | ICD-10-CM | POA: Diagnosis not present

## 2024-03-25 DIAGNOSIS — M9901 Segmental and somatic dysfunction of cervical region: Secondary | ICD-10-CM | POA: Diagnosis not present

## 2024-04-08 DIAGNOSIS — M9903 Segmental and somatic dysfunction of lumbar region: Secondary | ICD-10-CM | POA: Diagnosis not present

## 2024-04-08 DIAGNOSIS — M9902 Segmental and somatic dysfunction of thoracic region: Secondary | ICD-10-CM | POA: Diagnosis not present

## 2024-04-08 DIAGNOSIS — M9901 Segmental and somatic dysfunction of cervical region: Secondary | ICD-10-CM | POA: Diagnosis not present

## 2024-04-08 DIAGNOSIS — M62830 Muscle spasm of back: Secondary | ICD-10-CM | POA: Diagnosis not present

## 2024-04-22 DIAGNOSIS — M62830 Muscle spasm of back: Secondary | ICD-10-CM | POA: Diagnosis not present

## 2024-04-22 DIAGNOSIS — M9901 Segmental and somatic dysfunction of cervical region: Secondary | ICD-10-CM | POA: Diagnosis not present

## 2024-04-22 DIAGNOSIS — M9903 Segmental and somatic dysfunction of lumbar region: Secondary | ICD-10-CM | POA: Diagnosis not present

## 2024-04-22 DIAGNOSIS — M9902 Segmental and somatic dysfunction of thoracic region: Secondary | ICD-10-CM | POA: Diagnosis not present

## 2024-05-06 DIAGNOSIS — M9903 Segmental and somatic dysfunction of lumbar region: Secondary | ICD-10-CM | POA: Diagnosis not present

## 2024-05-06 DIAGNOSIS — M62830 Muscle spasm of back: Secondary | ICD-10-CM | POA: Diagnosis not present

## 2024-05-06 DIAGNOSIS — M9901 Segmental and somatic dysfunction of cervical region: Secondary | ICD-10-CM | POA: Diagnosis not present

## 2024-05-06 DIAGNOSIS — M9902 Segmental and somatic dysfunction of thoracic region: Secondary | ICD-10-CM | POA: Diagnosis not present

## 2024-05-16 DIAGNOSIS — Z960 Presence of urogenital implants: Secondary | ICD-10-CM | POA: Diagnosis not present

## 2024-05-20 ENCOUNTER — Other Ambulatory Visit: Payer: Self-pay | Admitting: *Deleted

## 2024-05-20 DIAGNOSIS — M810 Age-related osteoporosis without current pathological fracture: Secondary | ICD-10-CM

## 2024-05-20 MED ORDER — ALENDRONATE SODIUM 70 MG PO TABS: 70.0000 mg | ORAL_TABLET | ORAL | 0 refills | Status: AC

## 2024-05-20 NOTE — Telephone Encounter (Signed)
 Christy NTBS in Nov for 4 mos FU RF sent to pharmacy

## 2024-05-22 ENCOUNTER — Other Ambulatory Visit (HOSPITAL_COMMUNITY): Payer: Self-pay | Admitting: Obstetrics and Gynecology

## 2024-05-22 DIAGNOSIS — Z1231 Encounter for screening mammogram for malignant neoplasm of breast: Secondary | ICD-10-CM

## 2024-06-10 ENCOUNTER — Encounter: Payer: Self-pay | Admitting: Nurse Practitioner

## 2024-06-10 ENCOUNTER — Ambulatory Visit
Admission: EM | Admit: 2024-06-10 | Discharge: 2024-06-10 | Disposition: A | Discharge status: Home / Self Care | Attending: Family Medicine | Admitting: Family Medicine

## 2024-06-10 ENCOUNTER — Ambulatory Visit

## 2024-06-10 ENCOUNTER — Ambulatory Visit: Admitting: Nurse Practitioner

## 2024-06-10 VITALS — BP 168/69 | HR 75 | Temp 97.1°F | Ht 66.0 in | Wt 158.8 lb

## 2024-06-10 DIAGNOSIS — M25561 Pain in right knee: Secondary | ICD-10-CM | POA: Insufficient documentation

## 2024-06-10 DIAGNOSIS — R2241 Localized swelling, mass and lump, right lower limb: Secondary | ICD-10-CM | POA: Diagnosis not present

## 2024-06-10 NOTE — ED Triage Notes (Signed)
 Pt reports bilateral leg swelling x 2 weeks.

## 2024-06-10 NOTE — Progress Notes (Signed)
 Subjective:  Patient ID: Erin Khan, female    DOB: 08/29/1941, 83 y.o.   MRN: [id number]  Patient Care Team: Lavell Bari LABOR, FNP as PCP - General (Family Medicine) Estelle Service, MD as Consulting Physician (Obstetrics and Gynecology) Golda Claudis PENNER, MD (Inactive) as Consulting Physician (Gastroenterology) Billee Mliss BIRCH, [facility] as [facility] Care Management (Pharmacist)   Chief Complaint:  Cyst (Knot on right knee that she noticed Saturday afternoon.  States it is tender to touch and hurts when she bends her knee. )   HPI: Erin Khan is a 83 y.o. female presenting on 06/10/2024 for Cyst (Knot on right knee that she noticed Saturday afternoon.  States it is tender to touch and hurts when she bends her knee. )   Discussed the use of AI scribe software for clinical note transcription with the patient, who gave verbal consent to proceed.  History of Present Illness An 83 year old female who presents with right knee pain.  She has been experiencing right knee pain since yesterday morning, which began upon waking. The pain is described as an ache, similar to a headache, and is localized to a knot on her knee. She rates the pain as 5 out of 10 when pressure is applied.  She has attempted to manage the pain with both heat and ice, applying each once, but is uncertain which is more effective. She applied heat again last night before bed and has taken aspirin for the pain.  She denies any recent activities that could have caused the pain, stating that her usual Saturday activities include cooking breakfast and minimal movement. She attended a church cookout where she mostly sat and helped with preparing food.  She mentions using a knee brace that her husband put on her while she was watching TV. No pain in the back of the knee and no other symptoms reported.      Relevant past medical, surgical, family, and social history reviewed and updated as indicated.   Allergies and medications reviewed and updated. Data reviewed: Chart in Epic.   Past Medical History:  Diagnosis Date   Cataract    Colon polyps    Hypercholesteremia    Hypertension     Past Surgical History:  Procedure Laterality Date   ABDOMINAL HYSTERECTOMY     BILATERAL OOPHORECTOMY     CATARACT EXTRACTION, BILATERAL  2014   CHOLECYSTECTOMY  1980   COLONOSCOPY     COLONOSCOPY N/A 05/15/2013   Procedure: COLONOSCOPY;  Surgeon: Claudis PENNER Golda, MD;  [facility];  Service: Endoscopy;  Laterality: N/A;  930   COLONOSCOPY N/A 10/18/2018   Procedure: COLONOSCOPY;  Surgeon: Golda Claudis PENNER, MD;  Location: [facility];  Service: Endoscopy;  Laterality: N/A;  830   cranial shunt     17 years ago   POLYPECTOMY  10/18/2018   Procedure: POLYPECTOMY;  Surgeon: Golda Claudis PENNER, MD;  Location: [facility];  Service: Endoscopy;;  colon    Social History   Socioeconomic History   Marital status: Married    Spouse name: Alexa   Number of children: 2   Years of education: 12   Highest education level: 12th grade  Occupational History   Occupation: Retired    Comment: Adminsitrative   Occupation: Garden Comptroller: [employer]    Comment: 20 to 25 hours a week part time  Tobacco Use   Smoking status: Never    Passive  exposure: Never   Smokeless tobacco: Never  Vaping Use   Vaping status: Never Used  Substance and Sexual Activity   Alcohol use: No   Drug use: No   Sexual activity: Yes  Other Topics Concern   Not on file  Social History Narrative   She lives with her husband, Alexa.   She  has eight grandchildren.   Social Drivers of Corporate investment banker Strain: Low Risk  (01/19/2024)   Overall Financial Resource Strain (CARDIA)    Difficulty of Paying Living Expenses: Not hard at all  Food Insecurity: No Food Insecurity (01/19/2024)   Hunger Vital Sign    Worried About Running Out of Food in the Last Year: Never true    Ran Out of Food in the  Last Year: Never true  Transportation Needs: No Transportation Needs (01/19/2024)   PRAPARE - Administrator, Civil Service (Medical): No    Lack of Transportation (Non-Medical): No  Physical Activity: Unknown (12/28/2023)   Exercise Vital Sign    Days of Exercise per Week: Patient declined    Minutes of Exercise per Session: Not on file  Stress: No Stress Concern Present (12/28/2023)   [employer] of Occupational Health - Occupational Stress Questionnaire    Feeling of Stress : Not at all  Social Connections: Socially Integrated (12/28/2023)   Social Connection and Isolation Panel    Frequency of Communication with Friends and Family: More than three times a week    Frequency of Social Gatherings with Friends and Family: More than three times a week    Attends Religious Services: More than 4 times per year    Active Member of [employer] or Organizations: Yes    Attends Engineer, structural: More than 4 times per year    Marital Status: Married  Catering manager Violence: Not At Risk (01/19/2024)   Humiliation, Afraid, Rape, and Kick questionnaire    Fear of Current or Ex-Partner: No    Emotionally Abused: No    Physically Abused: No    Sexually Abused: No    Outpatient Encounter Medications as of 06/10/2024  Medication Sig   albuterol  (VENTOLIN  HFA) 108 (90 Base) MCG/ACT inhaler Inhale 2 puffs into the lungs every 6 (six) hours as needed for wheezing or shortness of breath.   alendronate  (FOSAMAX ) 70 MG tablet Take 1 tablet (70 mg total) by mouth every 7 (seven) days. Take with a full glass of water  on an empty stomach.   amLODipine  (NORVASC ) 5 MG tablet Take 1 tablet (5 mg total) by mouth daily.   Calcium  Carbonate-Vitamin D  (CALCIUM  600+D PO) Take 1 tablet by mouth daily.   cetirizine  (EQ ALLERGY RELIEF, CETIRIZINE ,) 10 MG tablet Take 1 tablet (10 mg total) by mouth daily.   Ergocalciferol  (VITAMIN D2) 10 MCG (400 UNIT) TABS Take 400 Units by mouth daily.    ezetimibe  (ZETIA ) 10 MG tablet Take 1 tablet (10 mg total) by mouth daily.   lisinopril  (ZESTRIL ) 40 MG tablet Take 1 tablet (40 mg total) by mouth daily.   magnesium (MAGTAB) 84 MG ( ) TBCR SR tablet Take 84 mg by mouth.   rosuvastatin  (CRESTOR ) 10 MG tablet Take 1 tablet (10 mg total) by mouth daily.   vitamin B-12 (CYANOCOBALAMIN) 500 MCG tablet Take 500 mcg by mouth daily.    No facility-administered encounter medications on file as of 06/10/2024.    Allergies  Allergen Reactions   Asa [Aspirin] Hives and Swelling   Statins  made me feel funny    Pertinent ROS per HPI, otherwise unremarkable      Objective:  BP (!) 168/69   Pulse 75   Temp (!) 97.1 F (36.2 C)   Ht 5' 6 (1.676 m)   Wt 158 lb 12.8 oz (72 kg)   SpO2 95%   BMI 25.63 kg/m    Wt Readings from Last 3 Encounters:  06/10/24 158 lb 12.8 oz (72 kg)  03/12/24 154 lb 3.2 oz (69.9 kg)  12/28/23 150 lb (68 kg)    Physical Exam Vitals and nursing note reviewed.  Constitutional:      General: She is not in acute distress.    Appearance: Normal appearance.  HENT:     Head: Normocephalic and atraumatic.     Nose: Nose normal.     Mouth/Throat:     Mouth: Mucous membranes are moist.  Eyes:     Extraocular Movements: Extraocular movements intact.     Conjunctiva/sclera: Conjunctivae normal.     Pupils: Pupils are equal, round, and reactive to light.  Cardiovascular:     Heart sounds: Normal heart sounds.  Pulmonary:     Effort: Pulmonary effort is normal.     Breath sounds: Normal breath sounds.  Musculoskeletal:        General: Normal range of motion.     Right knee: Swelling present. No crepitus. Normal range of motion. Tenderness present. Normal alignment.     Left knee: Normal.     Right lower leg: No edema.     Left lower leg: No edema.  Skin:    General: Skin is warm and dry.     Findings: No rash.  Neurological:     Mental Status: She is alert and oriented to person, place, and time.   Psychiatric:        Mood and Affect: Mood normal.        Behavior: Behavior normal.        Thought Content: Thought content normal.        Judgment: Judgment normal.    Physical Exam MUSCULOSKELETAL: Right knee pain on palpation, pain scale 5/10.     Results for orders placed or performed in visit on 03/12/24  CMP14+EGFR   Collection Time: 03/12/24 10:51 AM  Result Value Ref Range   Glucose 94 70 - 99 mg/dL   BUN 40 (H) 8 - 27 mg/dL   Creatinine, Ser 8.50 (H) 0.57 - 1.00 mg/dL   eGFR 35 (L) >40 fO/fpw/8.26   BUN/Creatinine Ratio 27 12 - 28   Sodium 141 134 - 144 mmol/L   Potassium 4.7 3.5 - 5.2 mmol/L   Chloride 106 96 - 106 mmol/L   CO2 19 (L) 20 - 29 mmol/L   Calcium  9.2 8.7 - 10.3 mg/dL   Total Protein 6.9 6.0 - 8.5 g/dL   Albumin 4.1 3.7 - 4.7 g/dL   Globulin, Total 2.8 1.5 - 4.5 g/dL   Bilirubin Total 0.3 0.0 - 1.2 mg/dL   Alkaline Phosphatase 56 44 - 121 IU/L   AST 12 0 - 40 IU/L   ALT 15 0 - 32 IU/L       Pertinent labs & imaging results that were available during my care of the patient were reviewed by me and considered in my medical decision making.  Assessment & Plan:  There are no diagnoses linked to this encounter.   Assessment and Plan Mikhaela is a 83 yrs old Caucasian female seen today fro right  knee pain, no acute distress Assessment & Plan Right knee pain Acute right knee pain with a knot, severity 5/10, no trauma or fracture signs. - Advise rest and avoid extra activities. - Elevate knee when sitting. - Recommend knee brace. - Apply ice indirectly 15-20 minutes, 2-3 times daily. - Continue Tylenol as needed with food. - Use compression socks if available. - Instruct to return or call if no improvement by Friday.    Continue all other maintenance medications.  Follow up plan: No follow-ups on file.   Continue healthy lifestyle choices, including diet (rich in fruits, vegetables, and lean proteins, and low in salt and simple carbohydrates)  and exercise (at least 30 minutes of moderate physical activity daily).  Educational handout given for    Clinical References  Acute Knee Pain, Adult Many things can cause knee pain. Sometimes, knee pain is sudden (acute). It may be caused by damage, swelling, or irritation of the muscles and tissues that support your knee. Pain may come from: A fall. An injury to the knee from twisting motions. A hit to the knee. Infection. The pain often goes away on its own with time and rest. If the pain does not go away, tests may be done to find out what is causing the pain. These may include: Imaging tests, such as an X-ray, MRI, CT scan, or ultrasound. Joint aspiration. In this test, fluid is removed from the knee and checked. Arthroscopy. In this test, a lighted tube is put in the knee and an image is shown on a screen. A biopsy. In this test, a health care provider will remove a small piece of tissue for testing. Follow these instructions at home: If you have a knee sleeve or brace that can be taken off:  Wear the knee sleeve or brace as told by your provider. Take it off only if your provider says that you can. Check the skin around it every day. Tell your provider if you see problems. Loosen the knee sleeve or brace if your toes tingle, are numb, or turn cold and blue. Keep the knee sleeve or brace clean and dry. Bathing If the knee sleeve or brace is not waterproof: Do not let it get wet. Cover it when you take a bath or shower. Use a cover that does not let any water  in. Managing pain, stiffness, and swelling  If told, put ice on the area. If you have a knee sleeve or brace that you can take off, remove it as told. Put ice in a plastic bag. Place a towel between your skin and the bag. Leave the ice on for 20 minutes, 2-3 times a day. If your skin turns bright red, take off the ice right away to prevent skin damage. The risk of damage is higher if you cannot feel pain, heat, or  cold. Move your toes often to reduce stiffness and swelling. Raise the injured area above the level of your heart while you are sitting or lying down. Use a pillow to support your foot as needed. If told, use an elastic bandage to put pressure (compression) on your injured knee. This may control swelling, give support, and help with discomfort. Sleep with a pillow under your knee. Activity Rest your knee. Do not do things that cause pain or make pain worse. Do not stand or walk on your injured knee until you're told it's okay. Use crutches as told. Avoid activities where both feet leave the ground at the same time and  put stress on the joints. Avoid running, jumping rope, and doing jumping jacks. Work with a physical therapist to make a safe exercise program if told. Physical therapy helps your knee move better and get stronger. Exercise as told. General instructions Take your medicines only as told by your provider. If you are overweight, work with your provider and an expert in healthy eating, called a dietician, to set goals to lose weight. Being overweight can make your knee hurt more. Do not smoke, vape, or use products with nicotine or tobacco in them. If you need help quitting, talk with your provider. Return to normal activities when you are told. Ask what things are safe for you to do. Watch for any changes in your symptoms. Keep all follow-up visits. Your provider will check your healing and adjust treatments if needed. Contact a health care provider if: The knee pain does not stop. The knee pain changes or gets worse. You have a fever along with knee pain. Your knee is red or feels warm when you touch it. Your knee gives out or locks up. Get help right away if: Your knee swells and the swelling gets worse. You cannot move your knee. You have very bad knee pain that does not get better with medicine. This information is not intended to replace advice given to you by your health  care provider. Make sure you discuss any questions you have with your health care provider. Document Revised: 05/18/2023 Document Reviewed: 10/10/2022 Elsevier Patient Education  2024 Elsevier Inc. Exercises for Chronic Knee Pain Chronic knee pain is pain that lasts longer than 3 months. For most people with chronic knee pain, exercise and weight loss is an important part of treatment. Your health care provider may want you to focus on: Making the muscles that support your knee stronger. This can take pressure off your knee and reduce pain. Preventing knee stiffness. How far you can move your knee, keeping it there or making it farther. Losing weight (if this applies) to take pressure off your knee, lower your risk for injury, and make it easier for you to exercise. Your provider will help you make an exercise program that fits your needs and physical abilities. Below are simple, low-impact exercises you can do at home. Ask your provider or physical therapist how often you should do your exercise program and how many times to repeat each exercise. General safety tips  Get your provider's approval before doing any exercises. Start slowly and stop any time you feel pain. Do not exercise if your knee pain is flaring up. Warm up first. Stretching a cold muscle can cause an injury. Do 5-10 minutes of easy movement or light stretching before beginning your exercises. Do 5-10 minutes of low-impact activity (like walking or cycling) before starting strengthening exercises. Contact your provider any time you have pain during or after exercising. Exercise can cause discomfort but should not be painful. It is normal to be a little stiff or sore after exercising. Stretching and range-of-motion exercises Front thigh stretch  Stand up straight and support your body by holding on to a chair or resting one hand on a wall. With your legs straight and close together, bend one knee to lift your heel up toward  your butt. Using one hand for support, grab your ankle with your free hand. Pull your foot up closer toward your butt to feel the stretch in front of your thigh. Hold the stretch for 30 seconds. Repeat __________ times. Complete this  exercise __________ times a day. Back thigh stretch  Sit on the floor with your back straight and your legs out straight in front of you. Place the palms of your hands on the floor and slide them toward your feet as you bend at the hip. Try to touch your nose to your knees and feel the stretch in the back of your thighs. Hold for 30 seconds. Repeat __________ times. Complete this exercise __________ times a day. Calf stretch  Stand facing a wall. Place the palms of your hands flat against the wall, arms extended, and lean slightly against the wall. Get into a lunge position with one leg bent at the knee and the other leg stretched out straight behind you. Keep both feet facing the wall and increase the bend in your knee while keeping the heel of the other leg flat on the ground. You should feel the stretch in your calf. Hold for 30 seconds. Repeat __________ times. Complete this exercise __________ times a day. Strengthening exercises Straight leg lift  Lie on your back with one knee bent and the other leg out straight. Slowly lift the straight leg without bending the knee. Lift until your foot is about 12 inches (30 cm) off the floor. Hold for 3-5 seconds and slowly lower your leg. Repeat __________ times. Complete this exercise __________ times a day. Single leg dip  Stand between two chairs and put both hands on the backs of the chairs for support. Extend one leg out straight with your body weight resting on the heel of the standing leg. Slowly bend your standing knee to dip your body to the level that is comfortable for you. Hold for 3-5 seconds. Repeat __________ times. Complete this exercise __________ times a day. Hamstring curls  Stand  straight, knees close together, facing the back of a chair. Hold on to the back of a chair with both hands. Keep one leg straight. Bend the other knee while bringing the heel up toward the butt until the knee is bent at a 90-degree angle (right angle). Hold for 3-5 seconds. Repeat __________ times. Complete this exercise __________ times a day. Wall squat  Stand straight with your back, hips, and head against a wall. Step forward one foot at a time with your back still against the wall. Your feet should be 2 feet (61 cm) from the wall at shoulder width. Keeping your back, hips, and head against the wall, slide down the wall to as close to a sitting position as you can get. Hold for 5-10 seconds, then slowly slide back up. Repeat __________ times. Complete this exercise __________ times a day. Step-ups  Stand in front of a sturdy platform or stool that is about 6 inches (15 cm) high. Slowly step up with your left / right foot, keeping your knee in line with your hip and foot. Do not let your knee bend so far that you cannot see your toes. Hold on to a chair for balance, but do not use it for support. Slowly unlock your knee and lower yourself to the starting position. Repeat __________ times. Complete this exercise __________ times a day. Contact a health care provider if: Your exercises cause pain. Your pain is worse after you exercise. Your pain prevents you from doing your exercises. This information is not intended to replace advice given to you by your health care provider. Make sure you discuss any questions you have with your health care provider. Document Revised: 08/30/2022 Document Reviewed: 08/30/2022  Elsevier Patient Education  2024 Elsevier Inc.  The above assessment and management plan was discussed with the patient. The patient verbalized understanding of and has agreed to the management plan. Patient is aware to call the clinic if they develop any new symptoms or if symptoms  persist or worsen. Patient is aware when to return to the clinic for a follow-up visit. Patient educated on when it is appropriate to go to the emergency department.   Tanisa Lagace [place] Thompson, DNP [facility] [address] [place], [place] [address] [phone]

## 2024-06-10 NOTE — Discharge Instructions (Addendum)
 We will let you know if anything comes back abnormal on your knee x-ray.  I suspect your pain and localized swelling to be related to inflammation.  Use compression wraps, elevation, ice off-and-on, tylenol as needed

## 2024-06-11 ENCOUNTER — Telehealth: Payer: Self-pay | Admitting: Emergency Medicine

## 2024-06-11 NOTE — Telephone Encounter (Signed)
 Provider reported knee x-ray was negative for any bony abnormalities and would like pt to continue as planned with RICE, Tylenol as needed, and follow-up with primary care or orthopedics if not improving. Pt aware and verbalized understanding. Provider aware pt has been notified.

## 2024-06-11 NOTE — ED Provider Notes (Signed)
 [facility]    CSN: [id number] Arrival date & time: 06/10/24  1410      History   Chief Complaint No chief complaint on file.   HPI Erin Khan is a 83 y.o. female.   Patient today with 1 to 2-week history of a localized area of swelling to the right lateral knee region.  Denies discoloration, numbness, tingling, decreased range of motion, known injury to the area.  So far trying Tylenol with minimal relief.  No past history of similar.  Does have a history of right knee pain but never with localized swelling like this.    Past Medical History:  Diagnosis Date   Cataract    Colon polyps    Hypercholesteremia    Hypertension     Patient Active Problem List   Diagnosis Date Noted   Acute pain of right knee 06/10/2024   Vaginal vault prolapse 10/10/2019   History of colonic polyps 05/29/2018   CKD (chronic kidney disease), stage III (HCC) 07/07/2017   History of brain shunt 12/08/2016   Osteoporosis 12/08/2016   Hypertriglyceridemia 04/18/2016   HTN (hypertension) 03/21/2016   Hyperlipidemia 03/21/2016   Presence of pessary 10/14/2015    Past Surgical History:  Procedure Laterality Date   ABDOMINAL HYSTERECTOMY     BILATERAL OOPHORECTOMY     CATARACT EXTRACTION, BILATERAL  2014   CHOLECYSTECTOMY  1980   COLONOSCOPY     COLONOSCOPY N/A 05/15/2013   Procedure: COLONOSCOPY;  Surgeon: Claudis RAYMOND Rivet, MD;  Location: [facility];  Service: Endoscopy;  Laterality: N/A;  930   COLONOSCOPY N/A 10/18/2018   Procedure: COLONOSCOPY;  Surgeon: Rivet Claudis RAYMOND, MD;  Location: [facility];  Service: Endoscopy;  Laterality: N/A;  830   cranial shunt     17 years ago   POLYPECTOMY  10/18/2018   Procedure: POLYPECTOMY;  Surgeon: Rivet Claudis RAYMOND, MD;  Location: [facility];  Service: Endoscopy;;  colon    OB History   No obstetric history on file.      Home Medications    Prior to Admission medications   Medication Sig Start Date End Date Taking?  Authorizing Provider  albuterol  (VENTOLIN  HFA) 108 (90 Base) MCG/ACT inhaler Inhale 2 puffs into the lungs every 6 (six) hours as needed for wheezing or shortness of breath. 10/24/23   Lavell Lye A, FNP  alendronate  (FOSAMAX ) 70 MG tablet Take 1 tablet (70 mg total) by mouth every 7 (seven) days. Take with a full glass of water  on an empty stomach. 05/20/24 05/20/25  Lavell Lye A, FNP  amLODipine  (NORVASC ) 5 MG tablet Take 1 tablet (5 mg total) by mouth daily. 03/12/24   Lavell Lye A, FNP  Calcium  Carbonate-Vitamin D  (CALCIUM  600+D PO) Take 1 tablet by mouth daily.    [provider]  cetirizine  (EQ ALLERGY RELIEF, CETIRIZINE ,) 10 MG tablet Take 1 tablet (10 mg total) by mouth daily. 03/12/24   Lavell Lye A, FNP  Ergocalciferol  (VITAMIN D2) 10 MCG (400 UNIT) TABS Take 400 Units by mouth daily.    [provider]  ezetimibe  (ZETIA ) 10 MG tablet Take 1 tablet (10 mg total) by mouth daily. 03/12/24   Lavell Lye LABOR, FNP  lisinopril  (ZESTRIL ) 40 MG tablet Take 1 tablet (40 mg total) by mouth daily. 03/12/24   Lavell Lye A, FNP  magnesium (MAGTAB) 84 MG ( ) TBCR SR tablet Take 84 mg by mouth.    [provider]  rosuvastatin  (CRESTOR ) 10 MG  tablet Take 1 tablet (10 mg total) by mouth daily. 03/12/24   Lavell Bari LABOR, FNP  vitamin B-12 (CYANOCOBALAMIN) 500 MCG tablet Take 500 mcg by mouth daily.     [provider]    Family History Family History  Problem Relation Age of Onset   Hypertension Father    Lung cancer Sister    Arthritis Sister    Heart attack Brother 59   Heart disease Brother        No details   Asthma Brother    Healthy Daughter    Healthy Son    Colon cancer Neg Hx     Social History Social History   Tobacco Use   Smoking status: Never    Passive exposure: Never   Smokeless tobacco: Never  Vaping Use   Vaping status: Never Used  Substance Use Topics   Alcohol use: No   Drug use: No     Allergies   Asa  [aspirin] and Statins   Review of Systems Review of Systems Per HPI  Physical Exam Triage Vital Signs ED Triage Vitals  Encounter Vitals Group     BP 06/10/24 1507 122/72     Girls Systolic BP Percentile --      Girls Diastolic BP Percentile --      Boys Systolic BP Percentile --      Boys Diastolic BP Percentile --      Pulse Rate 06/10/24 1507 85     Resp 06/10/24 1507 20     Temp 06/10/24 1507 98.1 F (36.7 C)     Temp Source 06/10/24 1507 Oral     SpO2 06/10/24 1507 93 %     Weight --      Height --      Head Circumference --      Peak Flow --      Pain Score 06/10/24 1509 0     Pain Loc --      Pain Education --      Exclude from Growth Chart --    No data found.  Updated Vital Signs BP 122/72 (BP Location: Right Arm)   Pulse 85   Temp 98.1 F (36.7 C) (Oral)   Resp 20   SpO2 93%   Visual Acuity Right Eye Distance:   Left Eye Distance:   Bilateral Distance:    Right Eye Near:   Left Eye Near:    Bilateral Near:     Physical Exam Vitals and nursing note reviewed.  Constitutional:      Appearance: Normal appearance. She is not ill-appearing.  HENT:     Head: Atraumatic.  Eyes:     Extraocular Movements: Extraocular movements intact.     Conjunctiva/sclera: Conjunctivae normal.  Cardiovascular:     Rate and Rhythm: Normal rate.  Pulmonary:     Effort: Pulmonary effort is normal.  Musculoskeletal:        General: Swelling and tenderness present. Normal range of motion.     Cervical back: Normal range of motion and neck supple.     Comments: Localized swelling just below the joint line of the right knee laterally.  Tender to palpation.  No bony deformity palpable, range of motion of the right knee intact with no joint laxity, negative McMurray's and drawer testing  Skin:    General: Skin is warm and dry.     Findings: No bruising or erythema.  Neurological:     Mental Status: She is alert and oriented to  person, place, and time.     Motor: No  weakness.     Gait: Gait normal.     Comments: Right lower extremity neurovascularly intact  Psychiatric:        Mood and Affect: Mood normal.        Thought Content: Thought content normal.        Judgment: Judgment normal.      UC Treatments / Results  Labs (all labs ordered are listed, but only abnormal results are displayed) Labs Reviewed - No data to display  EKG   Radiology DG Knee Complete 4 Views Right Result Date: 06/10/2024 EXAM: 4 VIEW(S) XRAY OF THE RIGHT KNEE 06/10/2024 04:29:25 PM COMPARISON: None available. CLINICAL HISTORY: right lateral knee pain, localized swelling x several days, no known injury FINDINGS: BONES AND JOINTS: No acute fracture. No focal osseous lesion. No joint dislocation. No significant joint effusion. No significant degenerative changes. SOFT TISSUES: Arterial calcification in popliteal fossa. IMPRESSION: 1. No acute osseous abnormality of the knee. Electronically signed by: Norman Gatlin MD 06/10/2024 05:06 PM EDT RP Workstation: [id number]    Procedures Procedures (including critical care time)  Medications Ordered in UC Medications - No data to display  Initial Impression / Assessment and Plan / UC Course  I have reviewed the triage vital signs and the nursing notes.  Pertinent labs & imaging results that were available during my care of the patient were reviewed by me and considered in my medical decision making (see chart for details).     X-ray of the right knee negative for acute bony abnormality.  Suspect inflammatory cause, possibly bursitis versus OA flare.  Treat with RICE, Tylenol, and follow-up with orthopedics or primary care if worsening or not resolving.  Final Clinical Impressions(s) / UC Diagnoses   Final diagnoses:  Acute pain of right knee     Discharge Instructions      We will let you know if anything comes back abnormal on your knee x-ray.  I suspect your pain and localized swelling to be related to  inflammation.  Use compression wraps, elevation, ice off-and-on, tylenol as needed    ED Prescriptions   None    PDMP not reviewed this encounter.   Stuart Vernell Norris, [place] 06/11/24 1700

## 2024-06-24 ENCOUNTER — Encounter (HOSPITAL_COMMUNITY): Payer: Self-pay

## 2024-06-24 ENCOUNTER — Ambulatory Visit (HOSPITAL_COMMUNITY)
Admission: RE | Admit: 2024-06-24 | Discharge: 2024-06-24 | Disposition: A | Discharge status: Home / Self Care | Source: Ambulatory Visit | Attending: Obstetrics and Gynecology | Admitting: Obstetrics and Gynecology

## 2024-06-24 DIAGNOSIS — Z1231 Encounter for screening mammogram for malignant neoplasm of breast: Secondary | ICD-10-CM | POA: Diagnosis present

## 2024-07-04 ENCOUNTER — Encounter (INDEPENDENT_AMBULATORY_CARE_PROVIDER_SITE_OTHER): Payer: Self-pay | Admitting: Gastroenterology

## 2024-09-28 ENCOUNTER — Other Ambulatory Visit: Payer: Self-pay | Admitting: Family

## 2024-09-28 DIAGNOSIS — I10 Essential (primary) hypertension: Secondary | ICD-10-CM

## 2025-01-14 ENCOUNTER — Ambulatory Visit
